# Patient Record
Sex: Male | Born: 1955 | Hispanic: Yes | Marital: Married
Health system: Southern US, Academic
[De-identification: ages and names within clinical notes are randomized; demographics above are authoritative.]

## PROBLEM LIST (undated history)

## (undated) ENCOUNTER — Telehealth

## (undated) ENCOUNTER — Ambulatory Visit: Payer: PRIVATE HEALTH INSURANCE

## (undated) ENCOUNTER — Ambulatory Visit

## (undated) ENCOUNTER — Ambulatory Visit
Payer: PRIVATE HEALTH INSURANCE | Attending: Student in an Organized Health Care Education/Training Program | Primary: Student in an Organized Health Care Education/Training Program

## (undated) ENCOUNTER — Encounter

## (undated) ENCOUNTER — Ambulatory Visit: Payer: MEDICAID

## (undated) ENCOUNTER — Other Ambulatory Visit

## (undated) ENCOUNTER — Encounter
Attending: Student in an Organized Health Care Education/Training Program | Primary: Student in an Organized Health Care Education/Training Program

## (undated) DIAGNOSIS — I10 Essential (primary) hypertension: Secondary | ICD-10-CM

## (undated) DIAGNOSIS — C801 Malignant (primary) neoplasm, unspecified: Secondary | ICD-10-CM

## (undated) DIAGNOSIS — E119 Type 2 diabetes mellitus without complications: Secondary | ICD-10-CM

## (undated) DIAGNOSIS — D689 Coagulation defect, unspecified: Secondary | ICD-10-CM

## (undated) DIAGNOSIS — I8290 Acute embolism and thrombosis of unspecified vein: Secondary | ICD-10-CM

## (undated) HISTORY — PX: APPENDECTOMY: SHX54

## (undated) HISTORY — DX: Malignant (primary) neoplasm, unspecified: C80.1

## (undated) HISTORY — DX: Coagulation defect, unspecified: D68.9

---

## 2013-12-23 ENCOUNTER — Emergency Department: Payer: Self-pay | Admitting: Emergency Medicine

## 2013-12-23 LAB — URINALYSIS, COMPLETE
BACTERIA: NONE SEEN
BILIRUBIN, UR: NEGATIVE
Glucose,UR: NEGATIVE mg/dL (ref 0–75)
KETONE: NEGATIVE
Leukocyte Esterase: NEGATIVE
Nitrite: NEGATIVE
PROTEIN: NEGATIVE
Ph: 5 (ref 4.5–8.0)
RBC,UR: 1 /HPF (ref 0–5)
Specific Gravity: 1.02 (ref 1.003–1.030)
Squamous Epithelial: <1
WBC UR: NONE SEEN /HPF (ref 0–5)

## 2013-12-23 LAB — COMPREHENSIVE METABOLIC PANEL
ANION GAP: 5 — AB (ref 7–16)
Albumin: 3.8 g/dL (ref 3.4–5.0)
Alkaline Phosphatase: 69 U/L
BUN: 9 mg/dL (ref 7–18)
Bilirubin,Total: 0.8 mg/dL (ref 0.2–1.0)
CALCIUM: 9.1 mg/dL (ref 8.5–10.1)
CHLORIDE: 108 mmol/L — AB (ref 98–107)
CO2: 28 mmol/L (ref 21–32)
CREATININE: 1.17 mg/dL (ref 0.60–1.30)
EGFR (African American): >60
EGFR (Non-African Amer.): >60
Glucose: 90 mg/dL (ref 65–99)
Osmolality: 279 (ref 275–301)
POTASSIUM: 4.9 mmol/L (ref 3.5–5.1)
SGOT(AST): 13 U/L — ABNORMAL LOW (ref 15–37)
SGPT (ALT): 20 U/L
SODIUM: 141 mmol/L (ref 136–145)
TOTAL PROTEIN: 7.2 g/dL (ref 6.4–8.2)

## 2013-12-23 LAB — CBC WITH DIFFERENTIAL/PLATELET
HCT: 46.5 % (ref 40.0–52.0)
HGB: 14.7 g/dL (ref 13.0–18.0)
Lymphocytes: 82 %
MCH: 29.8 pg (ref 26.0–34.0)
MCHC: 31.6 g/dL — AB (ref 32.0–36.0)
MCV: 94 fL (ref 80–100)
Monocytes: 1 %
Other Cells Blood: 2
PLATELETS: 132 10*3/uL — AB (ref 150–440)
RBC: 4.93 10*6/uL (ref 4.40–5.90)
RDW: 14.6 % — AB (ref 11.5–14.5)
SEGMENTED NEUTROPHILS: 6 %
Variant Lymphocyte - H1-Rlymph: 9 %
WBC: 63.7 10*3/uL — AB (ref 3.8–10.6)

## 2013-12-23 LAB — TROPONIN I

## 2013-12-23 LAB — LIPASE, BLOOD: Lipase: 147 U/L (ref 73–393)

## 2014-01-04 DIAGNOSIS — E119 Type 2 diabetes mellitus without complications: Secondary | ICD-10-CM | POA: Insufficient documentation

## 2014-01-08 ENCOUNTER — Ambulatory Visit: Payer: Self-pay | Admitting: Oncology

## 2014-01-08 LAB — CBC CANCER CENTER
BASOS ABS: 0.1 x10 3/mm (ref 0.0–0.1)
Basophil %: 0.2 %
EOS PCT: 0.6 %
Eosinophil #: 0.4 x10 3/mm (ref 0.0–0.7)
HCT: 45 % (ref 40.0–52.0)
HGB: 14.3 g/dL (ref 13.0–18.0)
LYMPHS PCT: 91 %
Lymphocyte #: 54.6 x10 3/mm — ABNORMAL HIGH (ref 1.0–3.6)
MCH: 29.7 pg (ref 26.0–34.0)
MCHC: 31.9 g/dL — ABNORMAL LOW (ref 32.0–36.0)
MCV: 93 fL (ref 80–100)
Monocyte #: 1 x10 3/mm (ref 0.2–1.0)
Monocyte %: 1.7 %
NEUTROS PCT: 6.5 %
Neutrophil #: 3.9 x10 3/mm (ref 1.4–6.5)
Platelet: 138 x10 3/mm — ABNORMAL LOW (ref 150–440)
RBC: 4.82 10*6/uL (ref 4.40–5.90)
RDW: 14.5 % (ref 11.5–14.5)
WBC: 60 x10 3/mm — AB (ref 3.8–10.6)

## 2014-01-11 ENCOUNTER — Ambulatory Visit: Payer: Self-pay | Admitting: Oncology

## 2014-02-11 ENCOUNTER — Ambulatory Visit: Payer: Self-pay | Admitting: Oncology

## 2014-04-09 ENCOUNTER — Ambulatory Visit: Payer: Self-pay | Admitting: Oncology

## 2014-09-27 ENCOUNTER — Ambulatory Visit: Payer: Self-pay | Admitting: Oncology

## 2014-10-30 ENCOUNTER — Inpatient Hospital Stay: Payer: Medicaid Other | Admitting: Oncology

## 2014-11-06 ENCOUNTER — Inpatient Hospital Stay: Payer: Medicaid Other | Attending: Oncology | Admitting: Oncology

## 2014-11-07 ENCOUNTER — Encounter: Payer: Self-pay | Admitting: Oncology

## 2014-11-07 NOTE — Progress Notes (Signed)
Patient did not show up for 11/06/14 appointment but showed up on 11/07/14.  When patient was informed by scheduling that he would need to reschedule appointment the patient refused then got up and walked out.

## 2014-11-15 ENCOUNTER — Encounter: Payer: Self-pay | Admitting: Oncology

## 2014-11-15 ENCOUNTER — Inpatient Hospital Stay: Payer: Medicaid Other | Attending: Oncology | Admitting: Oncology

## 2014-11-15 ENCOUNTER — Inpatient Hospital Stay: Payer: Medicaid Other

## 2014-11-15 VITALS — BP 126/76 | HR 60 | Temp 96.0°F | Resp 17 | Ht 75.0 in | Wt 224.1 lb

## 2014-11-15 DIAGNOSIS — M542 Cervicalgia: Secondary | ICD-10-CM | POA: Insufficient documentation

## 2014-11-15 DIAGNOSIS — D696 Thrombocytopenia, unspecified: Secondary | ICD-10-CM | POA: Diagnosis not present

## 2014-11-15 DIAGNOSIS — C9110 Chronic lymphocytic leukemia of B-cell type not having achieved remission: Secondary | ICD-10-CM | POA: Diagnosis not present

## 2014-11-15 LAB — CBC WITH DIFFERENTIAL/PLATELET
BASOS ABS: 0.4 10*3/uL — AB (ref 0–0.1)
Basophils Relative: 1 %
Eosinophils Absolute: 0.3 10*3/uL (ref 0–0.7)
HCT: 43.8 % (ref 40.0–52.0)
Hemoglobin: 14 g/dL (ref 13.0–18.0)
Lymphocytes Relative: 90 %
Lymphs Abs: 62.1 10*3/uL — ABNORMAL HIGH (ref 1.0–3.6)
MCH: 29.7 pg (ref 26.0–34.0)
MCHC: 32 g/dL (ref 32.0–36.0)
MCV: 93 fL (ref 80.0–100.0)
Monocytes Absolute: 0.9 10*3/uL (ref 0.2–1.0)
Monocytes Relative: 1 %
NEUTROS ABS: 4.4 10*3/uL (ref 1.4–6.5)
PLATELETS: 123 10*3/uL — AB (ref 150–440)
RBC: 4.72 MIL/uL (ref 4.40–5.90)
RDW: 14.6 % — ABNORMAL HIGH (ref 11.5–14.5)
WBC: 68.1 10*3/uL (ref 3.8–10.6)

## 2014-11-20 NOTE — Progress Notes (Signed)
[place]  [phone] [phone]  ID: Audry Riles. OB: [birth year]  MR#: [id number]  WGY#:[id number]  No care team member to display  CHIEF COMPLAINT: CLL, Rai stage 0  Chief Complaint  Patient presents with  . Follow-up    Discuss neck treatment     INTERVAL HISTORY: Patient  Silva Bandy last evaluated in clinic in September 2015. 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 He denies ever receiving treatment for his CLL. Currently, other than his neck pain he feels well. He has no neurologic complaints. He denies any fevers or night sweats. He has a good appetite and denies weight loss. He denies any chest pain or shortness of breath. He denies any nausea, vomiting, constipation, or diarrhea. He has no urinary complaints. Patient otherwise feels well and offers no further specific complaints.  REVIEW OF SYSTEMS:   Review of Systems  Constitutional: Negative for fever and weight loss.  Respiratory: Negative.   Cardiovascular: Negative.   Musculoskeletal: Positive for neck pain.  Neurological: Negative.     As per HPI. Otherwise, a complete review of systems is negatve.  PAST MEDICAL HISTORY: Past Medical History  Diagnosis Date  . Cancer     CLL monitored     PAST SURGICAL HISTORY: No past surgical history on file.  FAMILY HISTORY No family history on file.     ADVANCED DIRECTIVES:    HEALTH MAINTENANCE: History  Substance Use Topics  . Smoking status: Never Smoker   . Smokeless tobacco: Never Used  . Alcohol Use: No     Colonoscopy:  PAP:  Bone density:  Lipid panel:  Not on File  No current outpatient prescriptions on file.   No current facility-administered medications for this visit.    OBJECTIVE: Filed Vitals:   11/15/14 1105  BP: 126/76  Pulse: 60  Temp: 96 F (35.6 C)  Resp: 17     Body mass index is 28.01 kg/(m^2).    ECOG  FS:0 - Asymptomatic  General: Well-developed, well-nourished, no acute distress. Eyes: Pink conjunctiva, anicteric sclera. Lungs: Clear to auscultation bilaterally. Heart: Regular rate and rhythm. No rubs, murmurs, or gallops. Abdomen: Soft, nontender, nondistended. No organomegaly noted, normoactive bowel sounds. Musculoskeletal: No edema, cyanosis, or clubbing. Neuro: Alert, answering all questions appropriately. Cranial nerves grossly intact. Skin: No rashes or petechiae noted. Psych: Normal affect. Lymphatics: No cervical, calvicular, axillary or inguinal LAD.   LAB RESULTS:  Lab Results  Component Value Date   NA 141 12/23/2013   K 4.9 12/23/2013   CL 108* 12/23/2013   CO2 28 12/23/2013   GLUCOSE 90 12/23/2013   BUN 9 12/23/2013   CREATININE 1.17 12/23/2013   CALCIUM 9.1 12/23/2013   PROT 7.2 12/23/2013   ALBUMIN 3.8 12/23/2013   AST 13* 12/23/2013   ALT 20 12/23/2013   ALKPHOS 69 12/23/2013   BILITOT 0.8 12/23/2013   GFRNONAA >60 12/23/2013   GFRAA >60 12/23/2013    Lab Results  Component Value Date   WBC 68.1* 11/15/2014   NEUTROABS 4.4 11/15/2014   HGB 14.0 11/15/2014   HCT 43.8 11/15/2014   MCV 93.0 11/15/2014   PLT 123* 11/15/2014     STUDIES: No results found.  ASSESSMENT: CLL, Rai stage 0  PLAN:    1. CLL: Patient's white blood cell count is elevated at 68, which is essentially unchanged from September 2015. It is unclear what his baseline WBC count.  Patient's  peripheral blood flow cytometry confirmed the diagnosis.  Patient was noted to have a CD7 aberrant T cell antigen which occasionally can be considered poor prognosis. CT scan results from October 2015 revealed only mild lymphadenopathy. This only needs to be repeated if there is concern of progression of disease and treatment is necessary.  Patient does not require treatment at this time. He does not require bone marrow biopsy. Return to clinic in 6 months with repeat laboratory work and  further evaluation.   2. Thrombocytopenia: Mild. Likely secondary to CLL. Monitor.  Patient expressed understanding and was in agreement with this plan. He also understands that He can call clinic at any time with any questions, concerns, or complaints.     [name], MD   11/20/2014 2:35 PM

## 2014-11-28 ENCOUNTER — Encounter: Payer: Self-pay | Admitting: Emergency Medicine

## 2014-11-28 ENCOUNTER — Emergency Department
Admission: EM | Admit: 2014-11-28 | Discharge: 2014-11-28 | Disposition: A | Discharge status: Home / Self Care | Payer: Medicaid Other | Attending: Student | Admitting: Student

## 2014-11-28 DIAGNOSIS — M5416 Radiculopathy, lumbar region: Secondary | ICD-10-CM | POA: Insufficient documentation

## 2014-11-28 DIAGNOSIS — M5410 Radiculopathy, site unspecified: Secondary | ICD-10-CM

## 2014-11-28 DIAGNOSIS — M545 Low back pain: Secondary | ICD-10-CM | POA: Diagnosis present

## 2014-11-28 MED ORDER — DEXAMETHASONE SODIUM PHOSPHATE 10 MG/ML IJ SOLN
10.0000 mg | Freq: Once | INTRAMUSCULAR | Status: AC
  Administered 2014-11-28: 10 mg via INTRAMUSCULAR
  Filled 2014-11-28: qty 1

## 2014-11-28 MED ORDER — TRAMADOL HCL 50 MG PO TABS: 50.0000 mg | ORAL_TABLET | Freq: Four times a day (QID) | ORAL | Status: DC | PRN

## 2014-11-28 MED ORDER — METHYLPREDNISOLONE 4 MG PO TBPK: ORAL_TABLET | ORAL | Status: DC

## 2014-11-28 NOTE — ED Notes (Signed)
Pt to ed with c./o lower back pain and right leg pain, pt states increased pain with ambulation, denies injury.

## 2014-11-28 NOTE — ED Notes (Signed)
Pt to ed with c/o lower back pain and right leg pain,  Denies recent injury, reports hx of chronic back pain. Ambulates with pain.

## 2014-11-28 NOTE — ED Provider Notes (Signed)
[facility] Emergency Department Provider Note  ____________________________________________  Time seen: Approximately 9:25 AM  I have reviewed the triage vital signs and the nursing notes.   HISTORY  Chief Complaint No chief complaint on file.    HPI Anthony Hart. is a 59 y.o. male patient complain of 6 months of radicular low back pain to the lower extremities. Patient denies any bladder or bowel dysfunction. Patient has been followed by his PCP who is referring him to a specialist. Patient said he is scheduled to see his PCP tomorrow but does not think for his discomfort at this time. Patient is rating his pain as a 7/10. Describes as sharp in the back and tingling into the lower extremities. Currently is taking muscle relaxants for his complaint.   Past Medical History  Diagnosis Date  . Cancer     CLL monitored     There are no active problems to display for this patient.   No past surgical history on file.  Current Outpatient Rx  Name  Route  Sig  Dispense  Refill  . methylPREDNISolone (MEDROL DOSEPAK) 4 MG TBPK tablet      Take Tapered dose as directed   21 tablet   0   . traMADol (ULTRAM) 50 MG tablet   Oral   Take 1 tablet (50 mg total) by mouth every 6 (six) hours as needed for moderate pain.   12 tablet   0     Allergies Review of patient's allergies indicates not on file.  No family history on file.  Social History Social History  Substance Use Topics  . Smoking status: Never Smoker   . Smokeless tobacco: Never Used  . Alcohol Use: No    Review of Systems Constitutional: No fever/chills Eyes: No visual changes. ENT: No sore throat. Cardiovascular: Denies chest pain. Respiratory: Denies shortness of breath. Gastrointestinal: No abdominal pain.  No nausea, no vomiting.  No diarrhea.  No constipation. Genitourinary: Negative for dysuria. Musculoskeletal: Positive for back pain. Skin: Negative for  rash. Neurological: Negative for headaches, focal weakness or numbness. 10-point ROS otherwise negative.  ____________________________________________   PHYSICAL EXAM:  VITAL SIGNS: ED Triage Vitals  Enc Vitals Group     BP --      Pulse --      Resp --      Temp --      Temp src --      SpO2 --      Weight --      Height --      Head Cir --      Peak Flow --      Pain Score --      Pain Loc --      Pain Edu? --      Excl. in [place]? --     Constitutional: Alert and oriented. Well appearing and in no acute distress. Eyes: Conjunctivae are normal. PERRL. EOMI. Head: Atraumatic. Nose: No congestion/rhinnorhea. Mouth/Throat: Mucous membranes are moist.  Oropharynx non-erythematous. Neck: No stridor. No cervical spine tenderness to palpation. Hematological/Lymphatic/Immunilogical: No cervical lymphadenopathy. Cardiovascular: Normal rate, regular rhythm. Grossly normal heart sounds.  Good peripheral circulation. Respiratory: Normal respiratory effort.  No retractions. Lungs CTAB. Gastrointestinal: Soft and nontender. No distention. No abdominal bruits. No CVA tenderness. Musculoskeletal: No spinal deformity. Decreased range of motion with extension. No lower extremity tenderness nor edema.  No joint effusions. Patient has a positive trait leg test. Neurologic:  Normal speech and language. No  gross focal neurologic deficits are appreciated. No gait instability. Skin:  Skin is warm, dry and intact. No rash noted. Psychiatric: Mood and affect are normal. Speech and behavior are normal.  ____________________________________________   LABS (all labs ordered are listed, but only abnormal results are displayed)  Labs Reviewed - No data to display ____________________________________________  EKG   ____________________________________________  RADIOLOGY   ____________________________________________   PROCEDURES  Procedure(s) performed: None  Critical Care performed:  No  ____________________________________________   INITIAL IMPRESSION / ASSESSMENT AND PLAN / ED COURSE  Pertinent labs & imaging results that were available during my care of the patient were reviewed by me and considered in my medical decision making (see chart for details).  Radicular low back pain to the bilateral extremities. Patient started on prednisone and tramadol. Positive follow-up with scheduled appointment. Return by ER if condition worsens. ____________________________________________   FINAL CLINICAL IMPRESSION(S) / ED DIAGNOSES  Final diagnoses:  Radicular low back pain      Sable Feil, PA-C 11/28/14 7616  Joanne Gavel, MD 11/28/14 [phone]

## 2015-01-01 ENCOUNTER — Other Ambulatory Visit: Payer: Self-pay | Admitting: Gastroenterology

## 2015-01-01 DIAGNOSIS — R1013 Epigastric pain: Principal | ICD-10-CM

## 2015-01-01 DIAGNOSIS — R1032 Left lower quadrant pain: Secondary | ICD-10-CM

## 2015-01-01 DIAGNOSIS — R1031 Right lower quadrant pain: Secondary | ICD-10-CM

## 2015-01-01 DIAGNOSIS — G8929 Other chronic pain: Secondary | ICD-10-CM

## 2015-01-07 ENCOUNTER — Ambulatory Visit
Admission: RE | Admit: 2015-01-07 | Discharge: 2015-01-07 | Disposition: A | Discharge status: Home / Self Care | Payer: Medicaid Other | Source: Ambulatory Visit | Attending: Gastroenterology | Admitting: Gastroenterology

## 2015-01-07 DIAGNOSIS — R1032 Left lower quadrant pain: Secondary | ICD-10-CM

## 2015-01-07 DIAGNOSIS — G8929 Other chronic pain: Secondary | ICD-10-CM | POA: Diagnosis present

## 2015-01-07 DIAGNOSIS — I700 Atherosclerosis of aorta: Secondary | ICD-10-CM | POA: Diagnosis not present

## 2015-01-07 DIAGNOSIS — R911 Solitary pulmonary nodule: Secondary | ICD-10-CM | POA: Diagnosis not present

## 2015-01-07 DIAGNOSIS — R1031 Right lower quadrant pain: Secondary | ICD-10-CM

## 2015-01-07 DIAGNOSIS — R161 Splenomegaly, not elsewhere classified: Secondary | ICD-10-CM | POA: Diagnosis not present

## 2015-01-07 DIAGNOSIS — R1013 Epigastric pain: Secondary | ICD-10-CM

## 2015-01-07 LAB — POCT I-STAT CREATININE: CREATININE: 1.1 mg/dL (ref 0.61–1.24)

## 2015-01-07 MED ORDER — IOHEXOL 300 MG/ML  SOLN
100.0000 mL | Freq: Once | INTRAMUSCULAR | Status: AC | PRN
  Administered 2015-01-07: 100 mL via INTRAVENOUS

## 2015-02-01 ENCOUNTER — Other Ambulatory Visit: Payer: Self-pay | Admitting: Student

## 2015-02-01 DIAGNOSIS — G8929 Other chronic pain: Secondary | ICD-10-CM

## 2015-02-01 DIAGNOSIS — M5441 Lumbago with sciatica, right side: Principal | ICD-10-CM

## 2015-02-01 DIAGNOSIS — M5442 Lumbago with sciatica, left side: Principal | ICD-10-CM

## 2015-02-04 ENCOUNTER — Ambulatory Visit: Payer: Medicaid Other | Admitting: Anesthesiology

## 2015-02-04 ENCOUNTER — Encounter
Admission: RE | Disposition: A | Discharge status: Home / Self Care | Payer: Self-pay | Source: Ambulatory Visit | Attending: Gastroenterology

## 2015-02-04 ENCOUNTER — Ambulatory Visit
Admission: RE | Admit: 2015-02-04 | Discharge: 2015-02-04 | Disposition: A | Discharge status: Home / Self Care | Payer: Medicaid Other | Source: Ambulatory Visit | Attending: Gastroenterology | Admitting: Gastroenterology

## 2015-02-04 ENCOUNTER — Encounter: Payer: Self-pay | Admitting: Anesthesiology

## 2015-02-04 DIAGNOSIS — Z79899 Other long term (current) drug therapy: Secondary | ICD-10-CM | POA: Insufficient documentation

## 2015-02-04 DIAGNOSIS — G8929 Other chronic pain: Secondary | ICD-10-CM | POA: Diagnosis not present

## 2015-02-04 DIAGNOSIS — M549 Dorsalgia, unspecified: Secondary | ICD-10-CM | POA: Insufficient documentation

## 2015-02-04 DIAGNOSIS — K529 Noninfective gastroenteritis and colitis, unspecified: Secondary | ICD-10-CM | POA: Insufficient documentation

## 2015-02-04 DIAGNOSIS — R1084 Generalized abdominal pain: Secondary | ICD-10-CM | POA: Diagnosis present

## 2015-02-04 DIAGNOSIS — Z856 Personal history of leukemia: Secondary | ICD-10-CM | POA: Insufficient documentation

## 2015-02-04 DIAGNOSIS — K2950 Unspecified chronic gastritis without bleeding: Secondary | ICD-10-CM | POA: Diagnosis not present

## 2015-02-04 DIAGNOSIS — A048 Other specified bacterial intestinal infections: Secondary | ICD-10-CM | POA: Diagnosis not present

## 2015-02-04 DIAGNOSIS — D125 Benign neoplasm of sigmoid colon: Secondary | ICD-10-CM | POA: Diagnosis not present

## 2015-02-04 HISTORY — PX: COLONOSCOPY WITH PROPOFOL: SHX5780

## 2015-02-04 HISTORY — PX: ESOPHAGOGASTRODUODENOSCOPY (EGD) WITH PROPOFOL: SHX5813

## 2015-02-04 SURGERY — ESOPHAGOGASTRODUODENOSCOPY (EGD) WITH PROPOFOL
Anesthesia: General

## 2015-02-04 MED ORDER — SODIUM CHLORIDE 0.9 % IV SOLN
INTRAVENOUS | Status: DC
  Administered 2015-02-04: 12:00:00 via INTRAVENOUS

## 2015-02-04 MED ORDER — MIDAZOLAM HCL 2 MG/2ML IJ SOLN
INTRAMUSCULAR | Status: DC | PRN
  Administered 2015-02-04: 1 mg via INTRAVENOUS

## 2015-02-04 MED ORDER — FENTANYL CITRATE (PF) 100 MCG/2ML IJ SOLN
INTRAMUSCULAR | Status: DC | PRN
  Administered 2015-02-04: 50 ug via INTRAVENOUS

## 2015-02-04 MED ORDER — PROPOFOL 500 MG/50ML IV EMUL
INTRAVENOUS | Status: DC | PRN
Start: 2015-02-04 — End: 2015-02-04
  Administered 2015-02-04: 100 ug/kg/min via INTRAVENOUS

## 2015-02-04 MED ORDER — SODIUM CHLORIDE 0.9 % IV SOLN
INTRAVENOUS | Status: DC
  Administered 2015-02-04: 11:00:00 via INTRAVENOUS

## 2015-02-04 NOTE — Transfer of Care (Signed)
Immediate Anesthesia Transfer of Care Note  Patient: Anthony Hart.  Procedure(s) Performed: Procedure(s): ESOPHAGOGASTRODUODENOSCOPY (EGD) WITH PROPOFOL (N/A) COLONOSCOPY WITH PROPOFOL  Patient Location: PACU and Endoscopy Unit  Anesthesia Type:General  Level of Consciousness: sedated and patient cooperative  Airway & Oxygen Therapy: Patient Spontanous Breathing and Patient connected to face mask oxygen  Post-op Assessment: Report given to RN and Post -op Vital signs reviewed and stable  Post vital signs: Reviewed and stable  Last Vitals:  Filed Vitals:   02/04/15 1222  BP: 108/69  Pulse: 71  Temp: 36.5 C  Resp: 22    Complications: No apparent anesthesia complications

## 2015-02-04 NOTE — Op Note (Signed)
[facility] Gastroenterology Patient Name: Anthony Hart Procedure Date: 02/04/2015 11:45 AM MRN: [id number] Account #: [id number] Date of Birth: 1955/06/29 Admit Type: Outpatient Age: 59 Room: [facility] ENDO ROOM 4 Gender: Male Note Status: Finalized Procedure:         Upper GI endoscopy Indications:       Generalized abdominal pain Providers:         Lupita Dawn. Candace Cruise, MD Referring MD:      Ardyth Man. Bobette Mo (Referring MD) Medicines:         Monitored Anesthesia Care Complications:     No immediate complications. Procedure:         Pre-Anesthesia Assessment:                    - Prior to the procedure, a History and Physical was                     performed, and patient medications, allergies and                     sensitivities were reviewed. The patient's tolerance of                     previous anesthesia was reviewed.                    - The risks and benefits of the procedure and the sedation                     options and risks were discussed with the patient. All                     questions were answered and informed consent was obtained.                    - After reviewing the risks and benefits, the patient was                     deemed in satisfactory condition to undergo the procedure.                    After obtaining informed consent, the endoscope was passed                     under direct vision. Throughout the procedure, the                     patient's blood pressure, pulse, and oxygen saturations                     were monitored continuously. The Olympus GIF-160 endoscope                     (S#. [id number]) was introduced through the mouth, and                     advanced to the second part of duodenum. The upper GI                     endoscopy was accomplished without difficulty. The patient                     tolerated the procedure well. Findings:      The examined esophagus was normal.      Diffuse  mildly erythematous mucosa was found  in the gastric body.       Biopsies were taken with a cold forceps for histology.      The examined duodenum was normal.      The exam of the stomach was otherwise normal. Impression:        - Normal esophagus.                    - Erythematous mucosa in the gastric body. Biopsied.                    - Normal examined duodenum. Recommendation:    - Discharge patient to home.                    - Observe patient's clinical course.                    - Await pathology results.                    - The findings and recommendations were discussed with the                     patient. Procedure Code(s): --- Professional ---                    [phone], Esophagogastroduodenoscopy, flexible, transoral;                     with biopsy, single or multiple Diagnosis Code(s): --- Professional ---                    K31.9, Disease of stomach and duodenum, unspecified                    R10.84, Generalized abdominal pain CPT copyright 2014 American Medical Association. All rights reserved. The codes documented in this report are preliminary and upon coder review may  be revised to meet current compliance requirements. [name], MD 02/04/2015 11:54:41 AM This report has been signed electronically. Number of Addenda: 0 Note Initiated On: 02/04/2015 11:45 AM      [facility]

## 2015-02-04 NOTE — Op Note (Signed)
[facility] Gastroenterology Patient Name: Anthony Hart Procedure Date: 02/04/2015 11:56 AM MRN: [id number] Account #: [id number] Date of Birth: 1955/06/15 Admit Type: Outpatient Age: 59 Room: [facility] ENDO ROOM 4 Gender: Male Note Status: Finalized Procedure:         Colonoscopy Indications:       Generalized abdominal pain, Chronic diarrhea Providers:         Lupita Dawn. Candace Cruise, MD Referring MD:      Ardyth Man. Bobette Mo (Referring MD) Medicines:         Monitored Anesthesia Care Complications:     No immediate complications. Procedure:         Pre-Anesthesia Assessment:                    - Prior to the procedure, a History and Physical was                     performed, and patient medications, allergies and                     sensitivities were reviewed. The patient's tolerance of                     previous anesthesia was reviewed.                    - The risks and benefits of the procedure and the sedation                     options and risks were discussed with the patient. All                     questions were answered and informed consent was obtained.                    - After reviewing the risks and benefits, the patient was                     deemed in satisfactory condition to undergo the procedure.                    After obtaining informed consent, the colonoscope was                     passed under direct vision. Throughout the procedure, the                     patient's blood pressure, pulse, and oxygen saturations                     were monitored continuously. The Colonoscope was                     introduced through the anus and advanced to the the cecum,                     identified by appendiceal orifice and ileocecal valve. The                     colonoscopy was performed without difficulty. The patient                     tolerated the procedure well. The quality of the bowel  preparation was poor. Findings:      A  diminutive polyp was found in the distal sigmoid colon. The polyp was       sessile. The polyp was removed with a jumbo cold forceps. Resection and       retrieval were complete. Biopsies for histology were taken with a cold       forceps from the entire colon for evaluation of microscopic colitis.      The exam was otherwise without abnormality. Impression:        - Preparation of the colon was poor.                    - One diminutive polyp in the distal sigmoid colon.                     Resected and retrieved. Biopsied.                    - The examination was otherwise normal. Recommendation:    - Discharge patient to home.                    - Await pathology results.                    - Repeat colonoscopy in 5-10 years for surveillance based                     on pathology results.                    - The findings and recommendations were discussed with the                     patient. Procedure Code(s): --- Professional ---                    [phone], Colonoscopy, flexible; with biopsy, single or                     multiple Diagnosis Code(s): --- Professional ---                    D12.5, Benign neoplasm of sigmoid colon                    R10.84, Generalized abdominal pain                    K52.9, Noninfective gastroenteritis and colitis,                     unspecified CPT copyright 2014 American Medical Association. All rights reserved. The codes documented in this report are preliminary and upon coder review may  be revised to meet current compliance requirements. [name], MD 02/04/2015 12:19:14 PM This report has been signed electronically. Number of Addenda: 0 Note Initiated On: 02/04/2015 11:56 AM Scope Withdrawal Time: 0 hours 6 minutes 2 seconds  Total Procedure Duration: 0 hours 16 minutes 35 seconds       [facility]

## 2015-02-04 NOTE — H&P (Signed)
    Primary Care Physician:  PROVIDER NOT IN SYSTEM Primary Gastroenterologist:  Dr. Candace Cruise  Pre-Procedure History & Physical: HPI:  Anthony Hart. is a 59 y.o. male is here for an EGD/colonoscopy.   Past Medical History  Diagnosis Date  . Cancer     CLL monitored     Past Surgical History  Procedure Laterality Date  . Appendectomy      Prior to Admission medications   Medication Sig Start Date End Date Taking? Authorizing Provider  methylPREDNISolone (MEDROL DOSEPAK) 4 MG TBPK tablet Take Tapered dose as directed 11/28/14   Sable Feil, PA-C  traMADol (ULTRAM) 50 MG tablet Take 1 tablet (50 mg total) by mouth every 6 (six) hours as needed for moderate pain. 11/28/14   Sable Feil, PA-C    Allergies as of 01/09/2015  . (No Known Allergies)    No family history on file.  Social History   Social History  . Marital Status: Single    Spouse Name: N/A  . Number of Children: N/A  . Years of Education: N/A   Occupational History  . Not on file.   Social History Main Topics  . Smoking status: Never Smoker   . Smokeless tobacco: Never Used  . Alcohol Use: No  . Drug Use: No  . Sexual Activity: Not on file   Other Topics Concern  . Not on file   Social History Narrative    Review of Systems: See HPI, otherwise negative ROS  Physical Exam: There were no vitals taken for this visit. General:   Alert,  pleasant and cooperative in NAD Head:  Normocephalic and atraumatic. Neck:  Supple; no masses or thyromegaly. Lungs:  Clear throughout to auscultation.    Heart:  Regular rate and rhythm. Abdomen:  Soft, nontender and nondistended. Normal bowel sounds, without guarding, and without rebound.   Neurologic:  Alert and  oriented x4;  grossly normal neurologically.  Impression/Plan: Romualdo Bolk. is here for an EGD/colonoscopy to be performed for abdominal pain, diarrhea  Risks, benefits, limitations, and alternatives regarding EGD/colonoscopy have been  reviewed with the patient.  Questions have been answered.  All parties agreeable.   Loui Massenburg, Lupita Dawn, MD  02/04/2015, 9:37 AM

## 2015-02-04 NOTE — Anesthesia Preprocedure Evaluation (Signed)
Anesthesia Evaluation  Patient identified by MRN, date of birth, ID band Patient awake    Reviewed: Allergy & Precautions, H&P , NPO status , Patient's Chart, lab work & pertinent test results, reviewed documented beta blocker date and time   History of Anesthesia Complications Negative for: history of anesthetic complications  Airway Mallampati: II  TM Distance: >3 FB Neck ROM: full    Dental no notable dental hx. (+) Teeth Intact   Pulmonary neg pulmonary ROS,    Pulmonary exam normal breath sounds clear to auscultation       Cardiovascular Exercise Tolerance: Good negative cardio ROS Normal cardiovascular exam Rhythm:regular Rate:Normal     Neuro/Psych negative neurological ROS  negative psych ROS   GI/Hepatic negative GI ROS, Neg liver ROS,   Endo/Other  diabetes (100 lb weight loss and none since)  Renal/GU negative Renal ROS  negative genitourinary   Musculoskeletal   Abdominal   Peds  Hematology  (+) Blood dyscrasia (CLL), ,   Anesthesia Other Findings Past Medical History:   Cancer ([place])                                                   Comment:CLL monitored    Reproductive/Obstetrics negative OB ROS                             Anesthesia Physical Anesthesia Plan  ASA: II  Anesthesia Plan: General   Post-op Pain Management:    Induction:   Airway Management Planned:   Additional Equipment:   Intra-op Plan:   Post-operative Plan:   Informed Consent: I have reviewed the patients History and Physical, chart, labs and discussed the procedure including the risks, benefits and alternatives for the proposed anesthesia with the patient or authorized representative who has indicated his/her understanding and acceptance.   Dental Advisory Given  Plan Discussed with: Anesthesiologist, CRNA and Surgeon  Anesthesia Plan Comments:         Anesthesia Quick  Evaluation

## 2015-02-05 NOTE — Anesthesia Postprocedure Evaluation (Signed)
  Anesthesia Post-op Note  Patient: Anthony Hart.  Procedure(s) Performed: Procedure(s): ESOPHAGOGASTRODUODENOSCOPY (EGD) WITH PROPOFOL (N/A) COLONOSCOPY WITH PROPOFOL  Anesthesia type:General  Patient location: PACU  Post pain: Pain level controlled  Post assessment: Post-op Vital signs reviewed, Patient's Cardiovascular Status Stable, Respiratory Function Stable, Patent Airway and No signs of Nausea or vomiting  Post vital signs: Reviewed and stable  Last Vitals:  Filed Vitals:   02/04/15 1250  BP: 131/86  Pulse: 63  Temp:   Resp: 12    Level of consciousness: awake, alert  and patient cooperative  Complications: No apparent anesthesia complications

## 2015-02-07 ENCOUNTER — Encounter: Payer: Self-pay | Admitting: Gastroenterology

## 2015-02-07 LAB — SURGICAL PATHOLOGY

## 2015-02-13 ENCOUNTER — Ambulatory Visit
Admission: RE | Admit: 2015-02-13 | Discharge: 2015-02-13 | Disposition: A | Discharge status: Home / Self Care | Payer: Medicaid Other | Source: Ambulatory Visit | Attending: Student | Admitting: Student

## 2015-02-13 DIAGNOSIS — M5146 Schmorl's nodes, lumbar region: Secondary | ICD-10-CM | POA: Insufficient documentation

## 2015-02-13 DIAGNOSIS — M4807 Spinal stenosis, lumbosacral region: Secondary | ICD-10-CM | POA: Insufficient documentation

## 2015-02-13 DIAGNOSIS — G8929 Other chronic pain: Secondary | ICD-10-CM | POA: Diagnosis present

## 2015-02-13 DIAGNOSIS — M5136 Other intervertebral disc degeneration, lumbar region: Secondary | ICD-10-CM | POA: Diagnosis not present

## 2015-02-13 DIAGNOSIS — M2578 Osteophyte, vertebrae: Secondary | ICD-10-CM | POA: Diagnosis not present

## 2015-02-13 DIAGNOSIS — M5441 Lumbago with sciatica, right side: Secondary | ICD-10-CM | POA: Diagnosis present

## 2015-02-13 DIAGNOSIS — M5137 Other intervertebral disc degeneration, lumbosacral region: Secondary | ICD-10-CM | POA: Diagnosis not present

## 2015-02-13 DIAGNOSIS — M5442 Lumbago with sciatica, left side: Secondary | ICD-10-CM

## 2015-03-01 ENCOUNTER — Other Ambulatory Visit: Payer: Self-pay | Admitting: Orthopedic Surgery

## 2015-03-01 DIAGNOSIS — M1711 Unilateral primary osteoarthritis, right knee: Secondary | ICD-10-CM

## 2015-03-01 DIAGNOSIS — M1712 Unilateral primary osteoarthritis, left knee: Secondary | ICD-10-CM

## 2015-03-22 ENCOUNTER — Ambulatory Visit: Admission: RE | Admit: 2015-03-22 | Payer: Medicaid Other | Source: Ambulatory Visit

## 2015-03-28 ENCOUNTER — Emergency Department: Payer: Medicaid Other

## 2015-03-28 ENCOUNTER — Emergency Department
Admission: EM | Admit: 2015-03-28 | Discharge: 2015-03-29 | Disposition: A | Discharge status: Home / Self Care | Payer: Medicaid Other | Attending: Emergency Medicine | Admitting: Emergency Medicine

## 2015-03-28 DIAGNOSIS — R1013 Epigastric pain: Secondary | ICD-10-CM | POA: Diagnosis not present

## 2015-03-28 DIAGNOSIS — Z856 Personal history of leukemia: Secondary | ICD-10-CM | POA: Diagnosis not present

## 2015-03-28 DIAGNOSIS — J209 Acute bronchitis, unspecified: Secondary | ICD-10-CM | POA: Diagnosis not present

## 2015-03-28 DIAGNOSIS — R05 Cough: Secondary | ICD-10-CM | POA: Diagnosis present

## 2015-03-28 DIAGNOSIS — Z791 Long term (current) use of non-steroidal anti-inflammatories (NSAID): Secondary | ICD-10-CM | POA: Diagnosis not present

## 2015-03-28 DIAGNOSIS — R040 Epistaxis: Secondary | ICD-10-CM | POA: Diagnosis not present

## 2015-03-28 DIAGNOSIS — Z79899 Other long term (current) drug therapy: Secondary | ICD-10-CM | POA: Insufficient documentation

## 2015-03-28 DIAGNOSIS — Z87898 Personal history of other specified conditions: Secondary | ICD-10-CM

## 2015-03-28 LAB — COMPREHENSIVE METABOLIC PANEL
ALK PHOS: 60 U/L (ref 38–126)
ALT: 28 U/L (ref 17–63)
AST: 28 U/L (ref 15–41)
Albumin: 4 g/dL (ref 3.5–5.0)
Anion gap: 4 — ABNORMAL LOW (ref 5–15)
BUN: 12 mg/dL (ref 6–20)
CHLORIDE: 106 mmol/L (ref 101–111)
CO2: 32 mmol/L (ref 22–32)
CREATININE: 0.87 mg/dL (ref 0.61–1.24)
Calcium: 9 mg/dL (ref 8.9–10.3)
GFR calc Af Amer: >60 mL/min (ref >60)
Glucose, Bld: 104 mg/dL — ABNORMAL HIGH (ref 65–99)
Potassium: 4.5 mmol/L (ref 3.5–5.1)
Sodium: 142 mmol/L (ref 135–145)
Total Bilirubin: 0.5 mg/dL (ref 0.3–1.2)
Total Protein: 6.6 g/dL (ref 6.5–8.1)

## 2015-03-28 LAB — CBC
HCT: 41.5 % (ref 40.0–52.0)
Hemoglobin: 13.3 g/dL (ref 13.0–18.0)
MCH: 30.2 pg (ref 26.0–34.0)
MCHC: 32 g/dL (ref 32.0–36.0)
MCV: 94.4 fL (ref 80.0–100.0)
PLATELETS: 147 10*3/uL — AB (ref 150–440)
RBC: 4.4 MIL/uL (ref 4.40–5.90)
RDW: 15.2 % — AB (ref 11.5–14.5)
WBC: 18.1 10*3/uL — ABNORMAL HIGH (ref 3.8–10.6)

## 2015-03-28 NOTE — ED Notes (Signed)
Pt in with co runny nose and congestion for 1 week, has seen blood in nose and in sputum.  Pt is in remission for CLL, denies fever.

## 2015-03-29 MED ORDER — AZITHROMYCIN 250 MG PO TABS: ORAL_TABLET | ORAL | Status: DC

## 2015-03-29 NOTE — ED Provider Notes (Signed)
[facility] Emergency Department Provider Note  ____________________________________________  Time seen: Approximately 12:16 AM  I have reviewed the triage vital signs and the nursing notes.   HISTORY  Chief Complaint Cough    HPI Anthony Hart. is a 59 y.o. male whose past medical history includes CLL diagnosed approximately 12 years ago and who has not been on any chemotherapy for years and is followed by Dr. Regis Bill and.  He presents today with 1-2 weeks (he thinks closer to 2 weeks) of upper respiratory symptoms including runny nose, congestion, postnasal drip, and frequent cough that is productive of sputum.  Over the last couple of days he has had intermittent nosebleeds including earlier today and thinks that he may have coughed up some blood as well although it may have just come from his nose.  He denies chest pain, shortness of breath, wheezing, nausea/vomiting.  He has had some epigastric burning pain but he is also being treated for H. Pylori by Dr. Candace Cruise and this is not unusual for him to have similar discomfort.  He came in because of his symptoms being moderate, the bleeding from his nose, and is concerned given his CLL.  He states that he thinks he has bronchitis because of the persistent cough.  He also denies fever and chills as well as general malaise and myalgias.   Past Medical History  Diagnosis Date  . Cancer ([place])     CLL monitored     There are no active problems to display for this patient.   Past Surgical History  Procedure Laterality Date  . Appendectomy    . Esophagogastroduodenoscopy (egd) with propofol N/A 02/04/2015    Procedure: ESOPHAGOGASTRODUODENOSCOPY (EGD) WITH PROPOFOL;  Surgeon: [name], MD;  Location: [facility] ENDOSCOPY;  Service: Gastroenterology;  Laterality: N/A;  . Colonoscopy with propofol  02/04/2015    Procedure: COLONOSCOPY WITH PROPOFOL;  Surgeon: [name], MD;  Location: [facility] ENDOSCOPY;  Service:  Gastroenterology;;    Current Outpatient Rx  Name  Route  Sig  Dispense  Refill  . azithromycin (ZITHROMAX) 250 MG tablet      Take 2 tablets PO on day 1, then take 1 tablet PO daily for 4 more days   6 each   0   . cyclobenzaprine (FLEXERIL) 10 MG tablet   Oral   Take 10 mg by mouth 3 (three) times daily as needed for muscle spasms.         . meloxicam (MOBIC) 15 MG tablet   Oral   Take 15 mg by mouth daily.         . methylPREDNISolone (MEDROL DOSEPAK) 4 MG TBPK tablet      Take Tapered dose as directed   21 tablet   0   . traMADol (ULTRAM) 50 MG tablet   Oral   Take 1 tablet (50 mg total) by mouth every 6 (six) hours as needed for moderate pain.   12 tablet   0     Allergies Review of patient's allergies indicates no known allergies.  No family history on file.  Social History Social History  Substance Use Topics  . Smoking status: Never Smoker   . Smokeless tobacco: Never Used  . Alcohol Use: No    Review of Systems Constitutional: No fever/chills Eyes: No visual changes. ENT: No sore throat.  intermittent nosebleeds for 2 days.nasal congestion and "dripping". Cardiovascular: Denies chest pain. Respiratory: Denies shortness of breath.frequent productive cough for greater than 1  week Gastrointestinal: intermittent epigastric pain possibly with some mild hemoptysis.  No nausea, no vomiting.  No diarrhea.  No constipation. Genitourinary: Negative for dysuria. Musculoskeletal: Negative for back pain. Skin: Negative for rash. Neurological: Negative for headaches, focal weakness or numbness.  10-point ROS otherwise negative.  ____________________________________________   PHYSICAL EXAM:  VITAL SIGNS: ED Triage Vitals  Enc Vitals Group     BP 03/28/15 2225 142/77 mmHg     Pulse Rate 03/28/15 2225 64     Resp 03/28/15 2225 18     Temp 03/28/15 2225 97.8 F (36.6 C)     Temp Source 03/28/15 2225 Oral     SpO2 03/28/15 2225 98 %     Weight  03/28/15 2225 226 lb (102.513 kg)     Height 03/28/15 2225 6\' 3"  (1.905 m)     Head Cir --      Peak Flow --      Pain Score 03/28/15 2225 0     Pain Loc --      Pain Edu? --      Excl. in [place]? --     Constitutional: Alert and oriented. Well appearing and in no acute distress. Eyes: Conjunctivae are normal. PERRL. EOMI. Head: Atraumatic. Nose: No congestion/rhinnorhea at this time, but both nares have evidence of dried blood from his prior epistaxis.  No tenderness to palpation of the patient's sinuses. Mouth/Throat: Mucous membranes are moist.  Oropharynx non-erythematous. Neck: No stridor.   Cardiovascular: Normal rate, regular rhythm. Grossly normal heart sounds.  Good peripheral circulation. Respiratory: Normal respiratory effort.  No retractions. Lungs CTAB.no wheezing Rales or rhonchi Gastrointestinal: Soft and nontender. No distention. No abdominal bruits. No CVA tenderness. Musculoskeletal: No lower extremity tenderness nor edema.  No joint effusions. Neurologic:  Normal speech and language. No gross focal neurologic deficits are appreciated.  Skin:  Skin is warm, dry and intact. No rash noted. Psychiatric: Mood and affect are normal. Speech and behavior are normal.  ____________________________________________   LABS (all labs ordered are listed, but only abnormal results are displayed)  Labs Reviewed  COMPREHENSIVE METABOLIC PANEL - Abnormal; Notable for the following:    Glucose, Bld 104 (*)    Anion gap 4 (*)    All other components within normal limits  CBC - Abnormal; Notable for the following:    WBC 18.1 (*)    RDW 15.2 (*)    Platelets 147 (*)    All other components within normal limits   ____________________________________________  EKG  Not indicated ____________________________________________  RADIOLOGY   Dg Chest 2 View  03/28/2015  CLINICAL DATA:  Pt in with co runny nose and congestion for 1 week, has seen blood in nose and in sputum. Pt is  in remission for CLL, denies fever. no hx of heart or lung issues. EXAM: CHEST  2 VIEW COMPARISON:  None. FINDINGS: Midline trachea. Normal heart size and mediastinal contours. No pleural effusion or pneumothorax. Clear lungs. IMPRESSION: No acute cardiopulmonary disease. Electronically Signed   By: Abigail Miyamoto M.D.   On: 03/28/2015 22:48    ____________________________________________   PROCEDURES  Procedure(s) performed: None  Critical Care performed: No ____________________________________________   INITIAL IMPRESSION / ASSESSMENT AND PLAN / ED COURSE  Pertinent labs & imaging results that were available during my care of the patient were reviewed by me and considered in my medical decision making (see chart for details).  The patient is well-appearing and in no acute distress with normal lung sounds and normal  work of breathing.  Given the evidence of recent epistaxis I believe that the "hemoptysis" he was seeing is more likely blood that was from his nose.  He is having no active bleeding at this time.  I will add oncoagulation studies but except for a leukocytosis the patient's CBC is unremarkable.  I will treat him empirically with azithromycin given the duration of his bronchitis-like symptoms and I advised close outpatient follow-up with his primary care doctor.  I gave my usual and customary return precautions.   He is comfortable with the plan at this time.  ____________________________________________  FINAL CLINICAL IMPRESSION(S) / ED DIAGNOSES  Final diagnoses:  Acute bronchitis, unspecified organism  H/O epistaxis  Personal history of CLL (chronic lymphocytic leukemia)      NEW MEDICATIONS STARTED DURING THIS VISIT:  New Prescriptions   AZITHROMYCIN (ZITHROMAX) 250 MG TABLET    Take 2 tablets PO on day 1, then take 1 tablet PO daily for 4 more days     Hinda Kehr, MD 03/29/15 [phone]

## 2015-03-29 NOTE — Discharge Instructions (Signed)
We believe that your symptoms are caused today by bronchitis.  Please take the prescribed medications and any medications that you have at home.  Follow up with your doctor as recommended.  If you develop any new or worsening symptoms, including but not limited to fever, persistent vomiting, worsening shortness of breath, or other symptoms that concern you, please return to the Emergency Department immediately.  If you have a recurrence of your nosebleed, please try the following instructions: As we discussed, there are several techniques you can use to prevent or stop nosebleeds in the future.  Keep your nose moist either with saline spray several times a day or by applying a thin layer of Neosporin, bacitracin, or other antibiotic ointment to the inside of your nose once or twice a day.  If the bleeding starts up again, gently blow your nose into a tissue to clear the blood and clots, then apply 1-2 sprays to each affected nostril of over-the-counter Afrin nasal spray (oxymetazoline).   Then squeeze your nose shut tightly and DO NOT PEEK for at least 15 minutes.  This will resolve most nosebleeds.  If you continue to have trouble after trying these techniques, or anything seems out of the ordinary or concerns you, please return tot he Emergency Department.     Acute Bronchitis Bronchitis is inflammation of the airways that extend from the windpipe into the lungs (bronchi). The inflammation often causes mucus to develop. This leads to a cough, which is the most common symptom of bronchitis.  In acute bronchitis, the condition usually develops suddenly and goes away over time, usually in a couple weeks. Smoking, allergies, and asthma can make bronchitis worse. Repeated episodes of bronchitis may cause further lung problems.  CAUSES Acute bronchitis is most often caused by the same virus that causes a cold. The virus can spread from person to person (contagious) through coughing, sneezing, and  touching contaminated objects. SIGNS AND SYMPTOMS   Cough.   Fever.   Coughing up mucus.   Body aches.   Chest congestion.   Chills.   Shortness of breath.   Sore throat.  DIAGNOSIS  Acute bronchitis is usually diagnosed through a physical exam. Your health care provider will also ask you questions about your medical history. Tests, such as chest X-rays, are sometimes done to rule out other conditions.  TREATMENT  Acute bronchitis usually goes away in a couple weeks. Oftentimes, no medical treatment is necessary. Medicines are sometimes given for relief of fever or cough. Antibiotic medicines are usually not needed but may be prescribed in certain situations. In some cases, an inhaler may be recommended to help reduce shortness of breath and control the cough. A cool mist vaporizer may also be used to help thin bronchial secretions and make it easier to clear the chest.  HOME CARE INSTRUCTIONS  Get plenty of rest.   Drink enough fluids to keep your urine clear or pale yellow (unless you have a medical condition that requires fluid restriction). Increasing fluids may help thin your respiratory secretions (sputum) and reduce chest congestion, and it will prevent dehydration.   Take medicines only as directed by your health care provider.  If you were prescribed an antibiotic medicine, finish it all even if you start to feel better.  Avoid smoking and secondhand smoke. Exposure to cigarette smoke or irritating chemicals will make bronchitis worse. If you are a smoker, consider using nicotine gum or skin patches to help control withdrawal symptoms. Quitting smoking will help your  lungs heal faster.   Reduce the chances of another bout of acute bronchitis by washing your hands frequently, avoiding people with cold symptoms, and trying not to touch your hands to your mouth, nose, or eyes.   Keep all follow-up visits as directed by your health care provider.  SEEK MEDICAL  CARE IF: Your symptoms do not improve after 1 week of treatment.  SEEK IMMEDIATE MEDICAL CARE IF:  You develop an increased fever or chills.   You have chest pain.   You have severe shortness of breath.  You have bloody sputum.   You develop dehydration.  You faint or repeatedly feel like you are going to pass out.  You develop repeated vomiting.  You develop a severe headache. MAKE SURE YOU:   Understand these instructions.  Will watch your condition.  Will get help right away if you are not doing well or get worse.   This information is not intended to replace advice given to you by your health care provider. Make sure you discuss any questions you have with your health care provider.   Document Released: 05/07/2004 Document Revised: 04/20/2014 Document Reviewed: 09/20/2012 Elsevier Interactive Patient Education 2016 [place] are common. They are due to a crack in the inside lining of your nose (mucous membrane) or from a small blood vessel that starts to bleed. Nosebleeds can be caused by many conditions, such as injury, infections, dry mucous membranes or dry climate, medicines, nose picking, and home heating and cooling systems. Most nosebleeds come from blood vessels in the front of your nose.  HOME CARE INSTRUCTIONS  Try controlling your nosebleed by pinching your nostrils gently and continuously for at least 10 minutes.  Avoid blowing or sniffing your nose for a number of hours after having a nosebleed.  Do not put gauze inside your nose yourself. If your nose was packed by your health care provider, try to maintain the pack inside of your nose until your health care provider removes it.  If a gauze pack was used and it starts to fall out, gently replace it or cut off the end of it.  If a balloon catheter was used to pack your nose, do not cut or remove it unless your health care provider has instructed you to do that. Avoid lying down  while you are having a nosebleed. Sit up and lean forward.  Use a nasal spray decongestant to help with a nosebleed as directed by your health care provider.  Do not use petroleum jelly or mineral oil in your nose. These can drip into your lungs.  Maintain humidity in your home by using less air conditioning or by using a humidifier.  Aspirin and blood thinners make bleeding more likely. If you are prescribed these medicines and you suffer from nosebleeds, ask your health care provider if you should stop taking the medicines or adjust the dose. Do not stop medicines unless directed by your health care provider  Resume your normal activities as you are able, but avoid straining, lifting, or bending at the waist for several days.  If your nosebleed was caused by dry mucous membranes, use over-the-counter saline nasal spray or gel. This will keep the mucous membranes moist and allow them to heal. If you must use a lubricant, choose the water-soluble variety. Use it only sparingly, and do not use it within several hours of lying down.  Keep all follow-up visits as directed by your health care provider. This is  important. SEEK MEDICAL CARE IF:  You have a fever.  You get frequent nosebleeds.  You are getting nosebleeds more often. SEEK IMMEDIATE MEDICAL CARE IF:  Your nosebleed lasts longer than 20 minutes.  Your nosebleed occurs after an injury to your face, and your nose looks crooked or broken.  You have unusual bleeding from other parts of your body.  You have unusual bruising on other parts of your body.  You feel light-headed or you faint.  You become sweaty.  You vomit blood.  Your nosebleed occurs after a head injury. This information is not intended to replace advice given to you by your health care provider. Make sure you discuss any questions you have with your health care provider.  Document Released: 01/07/2005 Document Revised: 04/20/2014 Document Reviewed: 11/13/2013  Elsevier  Interactive Patient Education [employer].

## 2015-05-23 ENCOUNTER — Inpatient Hospital Stay: Payer: Medicaid Other | Admitting: Oncology

## 2015-05-23 ENCOUNTER — Inpatient Hospital Stay: Payer: Medicaid Other

## 2015-05-28 DIAGNOSIS — M5136 Other intervertebral disc degeneration, lumbar region: Secondary | ICD-10-CM | POA: Insufficient documentation

## 2015-05-30 ENCOUNTER — Inpatient Hospital Stay: Payer: Medicaid Other

## 2015-05-30 ENCOUNTER — Inpatient Hospital Stay: Payer: Medicaid Other | Admitting: Oncology

## 2015-06-17 ENCOUNTER — Inpatient Hospital Stay: Payer: Medicaid Other | Admitting: Oncology

## 2015-06-17 ENCOUNTER — Inpatient Hospital Stay: Payer: Medicaid Other

## 2016-01-16 ENCOUNTER — Telehealth: Payer: Self-pay | Admitting: Orthopedic Surgery

## 2016-01-17 ENCOUNTER — Ambulatory Visit: Payer: Medicaid Other

## 2016-01-22 IMAGING — MR MR LUMBAR SPINE W/O CM
5 series · 34 of 48 positions shown · non-contrast
Comparison: Lumbar MRI 12/23/2013.  Abdominal pelvic CT 01/07/2015.

CLINICAL DATA: Chronic low back pain with bilateral leg pain,
numbness and tingling worsening over the last 3 months. Remote
lifting injury. No acute injury or prior relevant surgery.

EXAM:
MRI LUMBAR SPINE WITHOUT CONTRAST
TECHNIQUE: Multiplanar, multisequence MR imaging of the lumbar spine was
performed. No intravenous contrast was administered.

[Series 2: T2 · sagittal · 4.0mm · 0.81mm/px · 6 of 17 slices shown (1 of 2)]
[im 1/17]
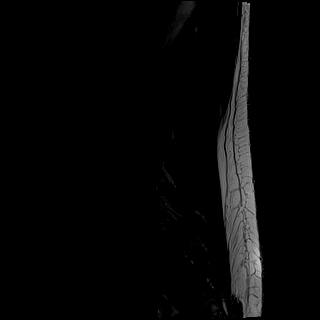
[im 4/17]
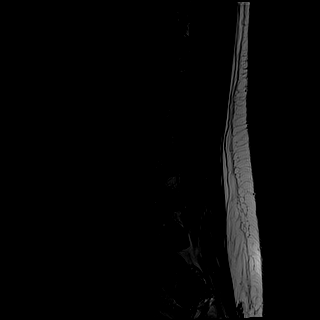
[im 7/17]
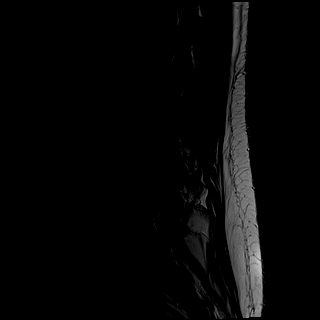
[im 10/17]
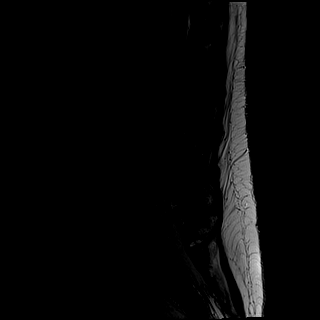
[im 13/17]
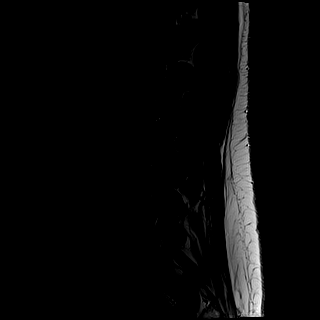
[im 17/17]
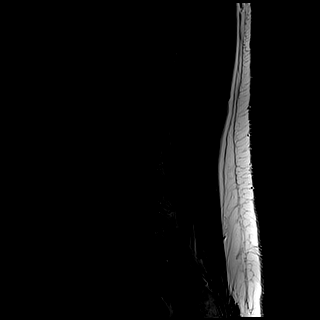

[Series 3: T1 · sagittal · 4.0mm · 0.81mm/px · 6 of 17 slices shown (1 of 2)]
[im 1/17]
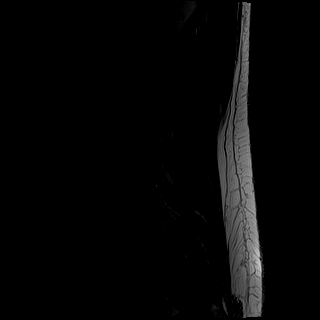
[im 4/17]
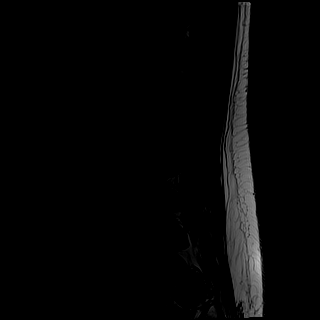
[im 7/17]
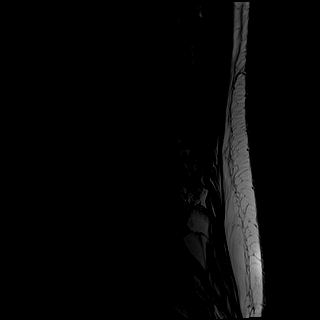
[im 10/17]
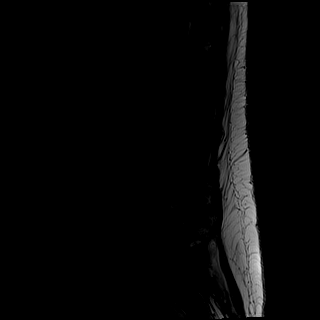
[im 13/17]
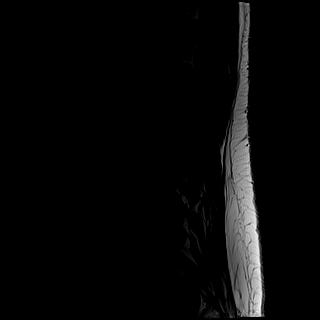
[im 17/17]
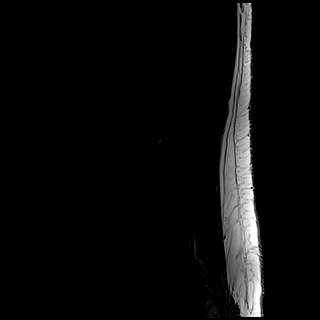

[Series 4: STIR · sagittal · 4.0mm · 1.02mm/px · 4 of 17 slices shown]
[im 1/17]
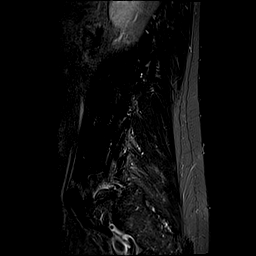
[im 4/17]
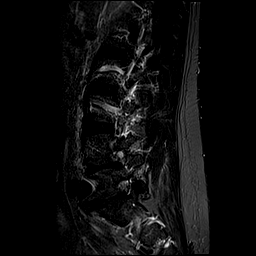
[im 7/17]
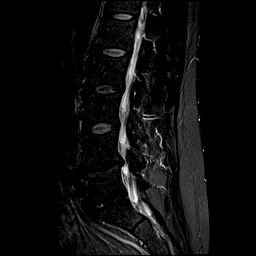
[im 10/17]
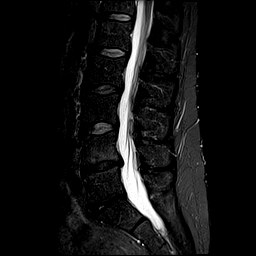

[Series 5: T2 · axial · 4.0mm · 0.78mm/px · z∈[-123,+80]mm · 9 of 40 slices shown (2 of 2)]
[im 1/40]
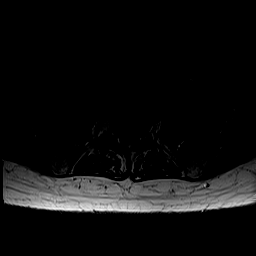
[im 6/40]
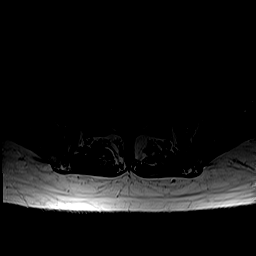
[im 12/40]
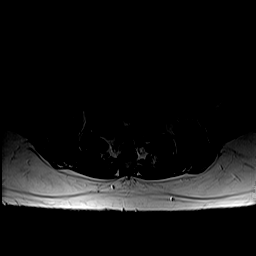
[im 17/40]
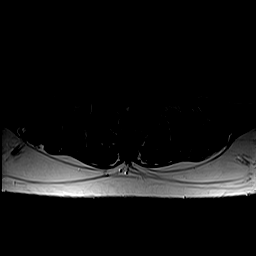
[im 20/40]
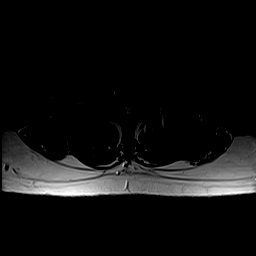
[im 23/40]
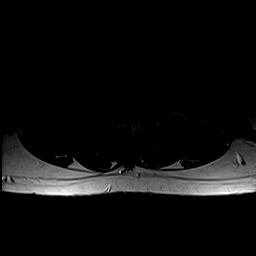
[im 28/40]
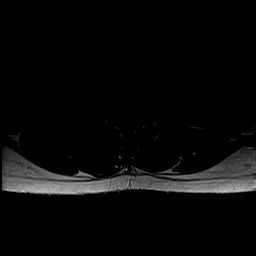
[im 34/40]
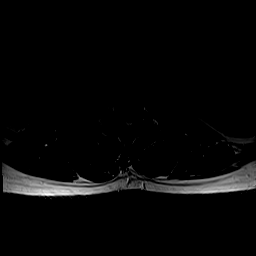
[im 40/40]
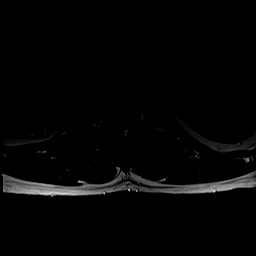

[Series 6: T1 · axial · 4.0mm · 0.39mm/px · z∈[-123,+80]mm · 9 of 40 slices shown (2 of 2)]
[im 1/40]
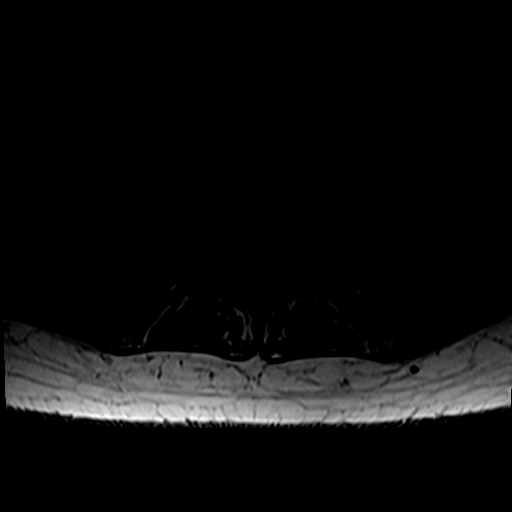
[im 6/40]
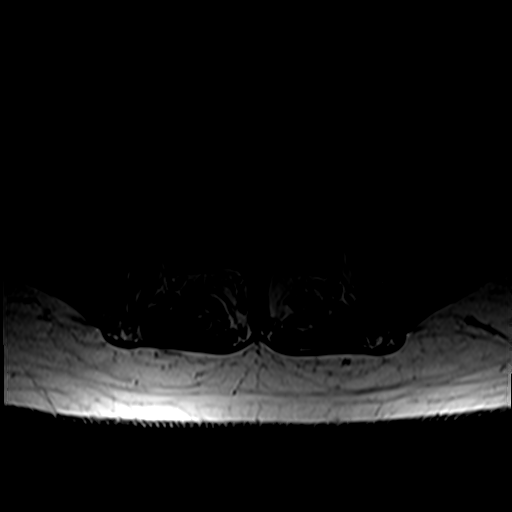
[im 12/40]
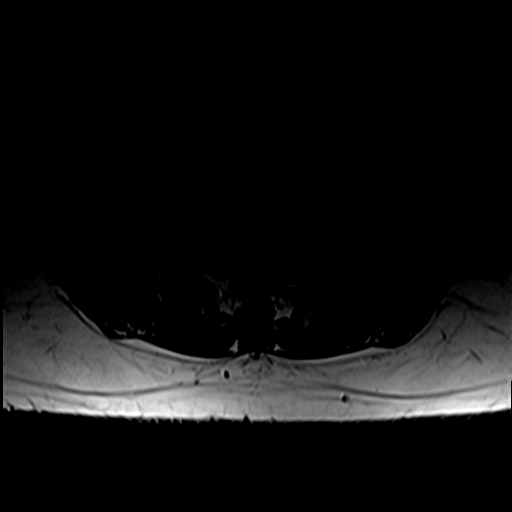
[im 17/40]
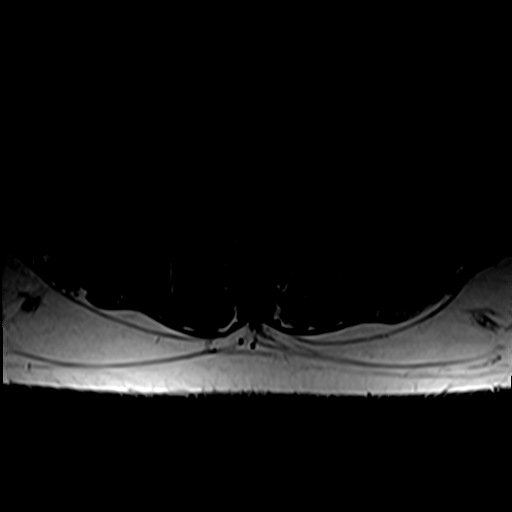
[im 20/40]
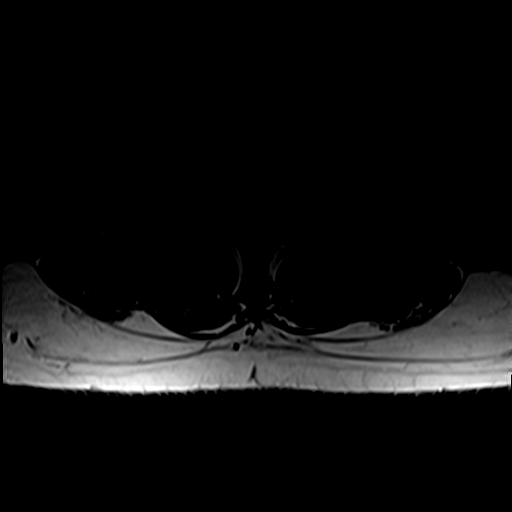
[im 23/40]
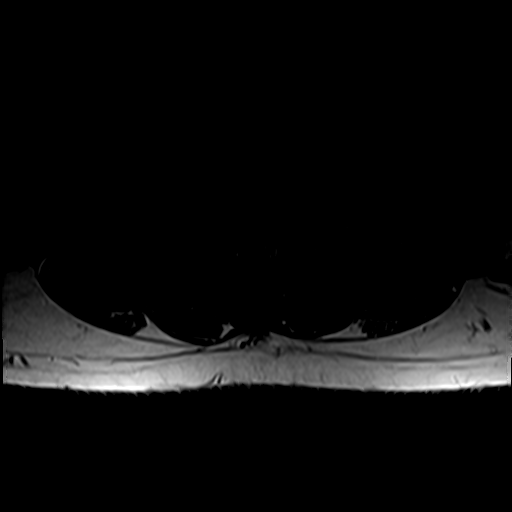
[im 28/40]
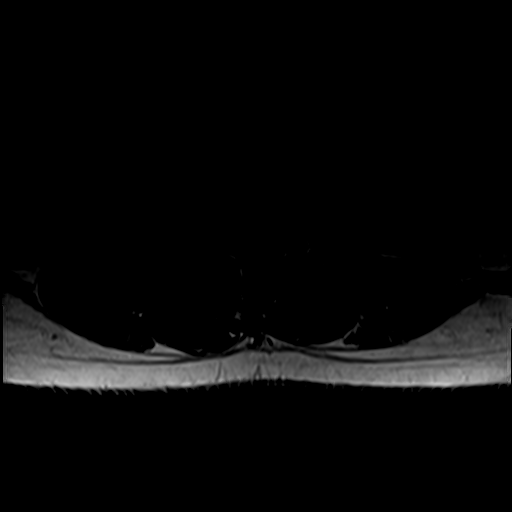
[im 34/40]
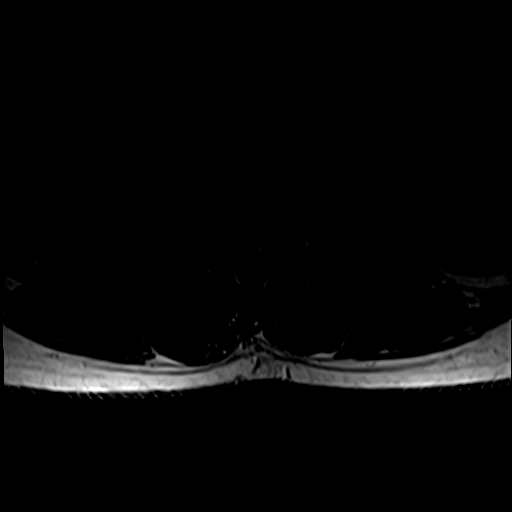
[im 40/40]
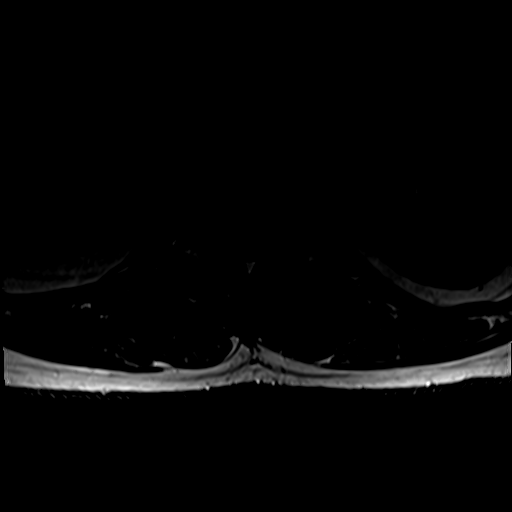

[34 of 48 positions shown; findings below may reference images not displayed]

FINDINGS: Segmentation: Conventional anatomy assumed, with the last open disc
space designated L5-S1.

Alignment:  Stable minimal convex left scoliosis.

Bones: There is an enlarging Schmorl's node in the inferior right
endplate of L4, measuring up to 1.4 cm in diameter. This has
enlarged from the previous MRI, although does not appear
significantly changed from the more recent CT. This has sclerotic
margins with surrounding fatty changes. Endplate degenerative
changes at L4-5 and L5-S1 are otherwise unchanged.

Conus medullaris: Extends to the L1 level and appears normal.

Paraspinal and other soft tissues: No significant paraspinal
findings.

Disc levels:

There are no significant disc space findings at T12-L1 or L1-2.

L2-3: Stable mild disc desiccation and bulging. No significant
spinal stenosis or nerve root encroachment.

L3-4: Disc height and hydration are maintained. Stable mild
bilateral facet hypertrophy. No spinal stenosis or nerve root
encroachment.

L4-5: Progressive loss of disc height with annular disc bulging
eccentric to the left. As above, enlarging Schmorl's node in the
inferior endplate of L4 on the right. The central disc protrusion
has involuted. There is stable mild facet and ligamentous
hypertrophy. There is mildly progressive narrowing of the left
lateral recess with possible extraforaminal left L4 nerve root
encroachment.

L5-S1: Stable chronic degenerative disc disease with loss of disc
height and endplate osteophytes. Mild facet degenerative changes are
present bile there is mild-to-moderate foraminal narrowing
bilaterally which appears unchanged. The lateral recesses and
central canal are patent.
IMPRESSION: 1. Progressive disc and endplate degeneration at L4-5 with an
enlarging Schmorl's node involving the right inferior L4 endplate.
Asymmetric disc bulging on the left contributes to mildly
progressive left lateral recess narrowing. In addition, there is
potential extraforaminal left L4 nerve root encroachment by disc
material and osteophytes.
2. Stable chronic degenerative disc disease at L5-S1 with paraspinal
osteophytes contributing to chronic mild-to-moderate foraminal
narrowing bilaterally.
3. Stable mild disc bulging at L2-3.

## 2016-06-30 ENCOUNTER — Emergency Department: Payer: Medicaid Other

## 2016-06-30 ENCOUNTER — Encounter: Payer: Self-pay | Admitting: Emergency Medicine

## 2016-06-30 ENCOUNTER — Inpatient Hospital Stay
Admission: EM | Admit: 2016-06-30 | Discharge: 2016-07-03 | DRG: 270 | Disposition: A | Discharge status: Home / Self Care | Payer: Medicaid Other | Attending: Internal Medicine | Admitting: Internal Medicine

## 2016-06-30 DIAGNOSIS — I82401 Acute embolism and thrombosis of unspecified deep veins of right lower extremity: Secondary | ICD-10-CM | POA: Diagnosis not present

## 2016-06-30 DIAGNOSIS — I82431 Acute embolism and thrombosis of right popliteal vein: Secondary | ICD-10-CM | POA: Diagnosis present

## 2016-06-30 DIAGNOSIS — Z7984 Long term (current) use of oral hypoglycemic drugs: Secondary | ICD-10-CM | POA: Diagnosis not present

## 2016-06-30 DIAGNOSIS — I82421 Acute embolism and thrombosis of right iliac vein: Secondary | ICD-10-CM | POA: Diagnosis present

## 2016-06-30 DIAGNOSIS — Z79899 Other long term (current) drug therapy: Secondary | ICD-10-CM | POA: Diagnosis not present

## 2016-06-30 DIAGNOSIS — I2699 Other pulmonary embolism without acute cor pulmonale: Secondary | ICD-10-CM | POA: Diagnosis present

## 2016-06-30 DIAGNOSIS — M7989 Other specified soft tissue disorders: Secondary | ICD-10-CM | POA: Diagnosis present

## 2016-06-30 DIAGNOSIS — I82409 Acute embolism and thrombosis of unspecified deep veins of unspecified lower extremity: Secondary | ICD-10-CM | POA: Diagnosis present

## 2016-06-30 DIAGNOSIS — Z9049 Acquired absence of other specified parts of digestive tract: Secondary | ICD-10-CM | POA: Diagnosis not present

## 2016-06-30 DIAGNOSIS — E119 Type 2 diabetes mellitus without complications: Secondary | ICD-10-CM | POA: Diagnosis present

## 2016-06-30 DIAGNOSIS — R2241 Localized swelling, mass and lump, right lower limb: Secondary | ICD-10-CM | POA: Diagnosis not present

## 2016-06-30 DIAGNOSIS — I824Y1 Acute embolism and thrombosis of unspecified deep veins of right proximal lower extremity: Secondary | ICD-10-CM

## 2016-06-30 DIAGNOSIS — I82411 Acute embolism and thrombosis of right femoral vein: Secondary | ICD-10-CM | POA: Diagnosis present

## 2016-06-30 DIAGNOSIS — C9110 Chronic lymphocytic leukemia of B-cell type not having achieved remission: Secondary | ICD-10-CM | POA: Diagnosis not present

## 2016-06-30 DIAGNOSIS — R079 Chest pain, unspecified: Secondary | ICD-10-CM

## 2016-06-30 HISTORY — DX: Type 2 diabetes mellitus without complications: E11.9

## 2016-06-30 LAB — CBC
HEMATOCRIT: 41.8 % (ref 40.0–52.0)
Hemoglobin: 13.8 g/dL (ref 13.0–18.0)
MCH: 29.5 pg (ref 26.0–34.0)
MCHC: 33 g/dL (ref 32.0–36.0)
MCV: 89.3 fL (ref 80.0–100.0)
PLATELETS: 212 10*3/uL (ref 150–440)
RBC: 4.68 MIL/uL (ref 4.40–5.90)
RDW: 15.1 % — ABNORMAL HIGH (ref 11.5–14.5)
WBC: 78.3 10*3/uL — AB (ref 3.8–10.6)

## 2016-06-30 LAB — BASIC METABOLIC PANEL
Anion gap: 6 (ref 5–15)
BUN: 14 mg/dL (ref 6–20)
CO2: 27 mmol/L (ref 22–32)
CREATININE: 1 mg/dL (ref 0.61–1.24)
Calcium: 9.3 mg/dL (ref 8.9–10.3)
Chloride: 104 mmol/L (ref 101–111)
GFR calc non Af Amer: >60 mL/min (ref >60)
Glucose, Bld: 141 mg/dL — ABNORMAL HIGH (ref 65–99)
Potassium: 5.1 mmol/L (ref 3.5–5.1)
Sodium: 137 mmol/L (ref 135–145)

## 2016-06-30 LAB — GLUCOSE, CAPILLARY: GLUCOSE-CAPILLARY: 168 mg/dL — AB (ref 65–99)

## 2016-06-30 LAB — PROTIME-INR
INR: 1.08
Prothrombin Time: 14 seconds (ref 11.4–15.2)

## 2016-06-30 LAB — APTT: aPTT: 79 seconds — ABNORMAL HIGH (ref 24–36)

## 2016-06-30 LAB — TROPONIN I: Troponin I: <0.03 ng/mL (ref <0.03)

## 2016-06-30 MED ORDER — ONDANSETRON HCL 4 MG/2ML IJ SOLN: 4.0000 mg | Freq: Four times a day (QID) | INTRAMUSCULAR | Status: DC | PRN

## 2016-06-30 MED ORDER — SENNOSIDES-DOCUSATE SODIUM 8.6-50 MG PO TABS: 1.0000 | ORAL_TABLET | Freq: Every evening | ORAL | Status: DC | PRN

## 2016-06-30 MED ORDER — CYCLOBENZAPRINE HCL 10 MG PO TABS: 10.0000 mg | ORAL_TABLET | Freq: Three times a day (TID) | ORAL | Status: DC | PRN

## 2016-06-30 MED ORDER — ONDANSETRON HCL 4 MG PO TABS: 4.0000 mg | ORAL_TABLET | Freq: Four times a day (QID) | ORAL | Status: DC | PRN

## 2016-06-30 MED ORDER — HEPARIN (PORCINE) IN NACL 100-0.45 UNIT/ML-% IJ SOLN
2200.0000 [IU]/h | INTRAMUSCULAR | Status: DC
  Administered 2016-07-01: 2200 [IU]/h via INTRAVENOUS
  Administered 2016-07-01: 1800 [IU]/h via INTRAVENOUS
  Administered 2016-07-02 – 2016-07-03 (×3): 2200 [IU]/h via INTRAVENOUS
  Filled 2016-06-30 (×7): qty 250

## 2016-06-30 MED ORDER — SODIUM CHLORIDE 0.9 % IV SOLN
INTRAVENOUS | Status: DC
  Administered 2016-07-01 – 2016-07-03 (×5): via INTRAVENOUS

## 2016-06-30 MED ORDER — BISACODYL 5 MG PO TBEC: 5.0000 mg | DELAYED_RELEASE_TABLET | Freq: Every day | ORAL | Status: DC | PRN

## 2016-06-30 MED ORDER — INSULIN ASPART 100 UNIT/ML ~~LOC~~ SOLN
0.0000 [IU] | Freq: Three times a day (TID) | SUBCUTANEOUS | Status: DC
  Administered 2016-07-01: 3 [IU] via SUBCUTANEOUS
  Administered 2016-07-01 (×2): 1 [IU] via SUBCUTANEOUS
  Administered 2016-07-02: 2 [IU] via SUBCUTANEOUS
  Administered 2016-07-02: 1 [IU] via SUBCUTANEOUS
  Administered 2016-07-03: 2 [IU] via SUBCUTANEOUS
  Administered 2016-07-03: 1 [IU] via SUBCUTANEOUS
  Filled 2016-06-30: qty 3
  Filled 2016-06-30: qty 1
  Filled 2016-06-30: qty 2
  Filled 2016-06-30 (×3): qty 1
  Filled 2016-06-30: qty 2

## 2016-06-30 MED ORDER — KETOROLAC TROMETHAMINE 15 MG/ML IJ SOLN: 15.0000 mg | Freq: Four times a day (QID) | INTRAMUSCULAR | Status: DC | PRN

## 2016-06-30 MED ORDER — HYDROCODONE-ACETAMINOPHEN 5-325 MG PO TABS: 1.0000 | ORAL_TABLET | ORAL | Status: DC | PRN

## 2016-06-30 MED ORDER — SODIUM CHLORIDE 0.9% FLUSH
3.0000 mL | Freq: Two times a day (BID) | INTRAVENOUS | Status: DC
  Administered 2016-07-01 – 2016-07-03 (×3): 3 mL via INTRAVENOUS

## 2016-06-30 MED ORDER — HEPARIN BOLUS VIA INFUSION
5500.0000 [IU] | Freq: Once | INTRAVENOUS | Status: AC
  Administered 2016-06-30: 5500 [IU] via INTRAVENOUS
  Filled 2016-06-30: qty 5500

## 2016-06-30 NOTE — Consult Note (Signed)
ANTICOAGULATION CONSULT NOTE - Initial Consult  Pharmacy Consult for heparin drip Indication: DVT  No Known Allergies  Patient Measurements: Height: 6\' 3"  (190.5 cm) Weight: 264 lb (119.7 kg) IBW/kg (Calculated) : 84.5 Heparin Dosing Weight: 109.9 kg  Vital Signs: Temp: 98.1 F (36.7 C) (03/20 1526) Temp Source: Oral (03/20 1526) BP: 119/80 (03/20 1526) Pulse Rate: 76 (03/20 1526)  Labs:  Recent Labs  06/30/16 1530  HGB 13.8  HCT 41.8  PLT 212  CREATININE 1.00  TROPONINI <0.03    Estimated Creatinine Clearance: 112.3 mL/min (by C-G formula based on SCr of 1 mg/dL).   Medical History: Past Medical History:  Diagnosis Date  . Cancer ([place])    CLL monitored   . 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 heparin  5,500 Units Intravenous Once    Assessment: Pt is a 61 year old male who presents with lower extremity swelling and SOB w/ exertion. 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 Pharmacy consulted to dose heparin drip. Pt is not on anticoagulation at home. Baseline labs ordered.  Goal of Therapy:  Heparin level 0.3-0.7 units/ml Monitor platelets by anticoagulation protocol: Yes   Plan:  Give 5500 units bolus x 1 Start heparin infusion at 1800 units/hr Check anti-Xa level in 6 hours and daily while on heparin Continue to monitor H&H and platelets  Melissa D Maccia, Pharm.D, BCPS Clinical Pharmacist  06/30/2016,6:35 PM

## 2016-06-30 NOTE — H&P (Signed)
[name] NAME: Anthony Hart    MR#:  [id number]  DATE OF BIRTH:  [birth year]  DATE OF ADMISSION:  06/30/2016  PRIMARY CARE PHYSICIAN: [name], MD   REQUESTING/REFERRING PHYSICIAN: dr Dineen Kid  CHIEF COMPLAINT:   Right lower extremity swelling HISTORY OF PRESENT ILLNESS:  Anthony Hart  is a 61 y.o. male with a known history of Leukemia and diabetes who presents with above complaint. Patient reports over the past week he has had increasing right lower extremity swelling as well as some shortness of breath. 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 He is not currently treated for leukemia. He is followed at the cancer center.  PAST MEDICAL HISTORY:   Past Medical History:  Diagnosis Date  . Cancer ([place])    CLL monitored   . Diabetes mellitus without complication ([place])    Type II    PAST SURGICAL HISTORY:   Past Surgical History:  Procedure Laterality Date  . APPENDECTOMY    . COLONOSCOPY WITH PROPOFOL  02/04/2015   Procedure: COLONOSCOPY WITH PROPOFOL;  Surgeon: [name], MD;  Location: [facility] ENDOSCOPY;  Service: Gastroenterology;;  . ESOPHAGOGASTRODUODENOSCOPY (EGD) WITH PROPOFOL N/A 02/04/2015   Procedure: ESOPHAGOGASTRODUODENOSCOPY (EGD) WITH PROPOFOL;  Surgeon: [name], MD;  Location: [facility] ENDOSCOPY;  Service: Gastroenterology;  Laterality: N/A;    SOCIAL HISTORY:   Social History  Substance Use Topics  . Smoking status: Never Smoker  . Smokeless tobacco: Never Used  . Alcohol use No    FAMILY HISTORY:  none  DRUG ALLERGIES:  No Known Allergies  REVIEW OF SYSTEMS:   ROS  MEDICATIONS AT HOME:   Prior to Admission medications   Medication Sig Start Date End Date Taking? Authorizing Provider  metFORMIN (GLUCOPHAGE-XR) 750 MG 24 hr tablet Take 750 mg by mouth daily. 03/09/16 03/09/17 Yes Historical Provider, MD  naproxen sodium (ANAPROX) 220 MG tablet Take 220 mg by mouth 2 (two)  times daily with a meal.   Yes Historical Provider, MD  azithromycin (ZITHROMAX) 250 MG tablet Take 2 tablets PO on day 1, then take 1 tablet PO daily for 4 more days Patient not taking: Reported on 06/30/2016 03/29/15   Hinda Kehr, MD  cyclobenzaprine (FLEXERIL) 10 MG tablet Take 10 mg by mouth 3 (three) times daily as needed for muscle spasms.    Historical Provider, MD  meloxicam (MOBIC) 15 MG tablet Take 15 mg by mouth daily.    Historical Provider, MD  methylPREDNISolone (MEDROL DOSEPAK) 4 MG TBPK tablet Take Tapered dose as directed Patient not taking: Reported on 06/30/2016 11/28/14   Sable Feil, PA-C  traMADol (ULTRAM) 50 MG tablet Take 1 tablet (50 mg total) by mouth every 6 (six) hours as needed for moderate pain. Patient not taking: Reported on 06/30/2016 11/28/14   Sable Feil, PA-C      VITAL SIGNS:  Blood pressure 126/78, pulse 65, temperature 98.1 F (36.7 C), temperature source Oral, resp. rate 17, height 6\' 3"  (1.905 m), weight 119.7 kg (264 lb), SpO2 100 %.  PHYSICAL EXAMINATION:   Physical Exam    LABORATORY PANEL:   CBC  Recent Labs Lab 06/30/16 1530  WBC 78.3*  HGB 13.8  HCT 41.8  PLT 212   ------------------------------------------------------------------------------------------------------------------  Chemistries   Recent Labs Lab 06/30/16 1530  NA 137  K 5.1  CL 104  CO2 27  GLUCOSE 141*  BUN 14  CREATININE 1.00  CALCIUM 9.3   ------------------------------------------------------------------------------------------------------------------  Cardiac Enzymes  Recent Labs Lab 06/30/16 1530  TROPONINI <0.03   ------------------------------------------------------------------------------------------------------------------  RADIOLOGY:  Dg Chest 2 View  Result Date: 06/30/2016 CLINICAL DATA:  Pt sent over via PCP with possible DVT to right leg. Pt reports pain, swelling to RLE x 6 days, pt reports intermittent shortness of breath  on exertion. 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 Never a smoker EXAM: CHEST  2 VIEW COMPARISON:  03/28/2015 FINDINGS: Normal mediastinum and cardiac silhouette. Normal pulmonary vasculature. No evidence of effusion, infiltrate, or pneumothorax. No acute bony abnormality. IMPRESSION: Normal chest radiograph. Electronically Signed   By: Suzy Bouchard M.D.   On: 06/30/2016 16:17   US Venous Img Lower Unilateral Right  Result Date: 06/30/2016 CLINICAL DATA:  Lower extremity edema for 6 days EXAM: RIGHT LOWER EXTREMITY VENOUS DUPLEX ULTRASOUND TECHNIQUE: Gray-scale sonography with graded compression, as well as color Doppler and duplex ultrasound were performed to evaluate the right lower extremity deep venous system from the level of the common femoral vein and including the common femoral, femoral, profunda femoral, popliteal and calf veins including the posterior tibial, peroneal and gastrocnemius veins when visible. The superficial great saphenous vein was also interrogated. Spectral Doppler was utilized to evaluate flow at rest and with distal augmentation maneuvers in the common femoral, femoral and popliteal veins. COMPARISON:  None. FINDINGS: Contralateral Common Femoral Vein: Respiratory phasicity is normal and symmetric with the symptomatic side. No evidence of thrombus. Normal compressibility. Common Femoral Vein: No evidence of thrombus. Normal compressibility, respiratory phasicity and response to augmentation. Saphenofemoral Junction: No evidence of thrombus. Normal compressibility and flow on color Doppler imaging. Profunda Femoral Vein: No evidence of thrombus. Normal compressibility and flow on color Doppler imaging. Femoral Vein: There is acute appearing thrombus throughout much of the mid the distal aspect of the right femoral vein. There is loss of Doppler signal in the areas of acute thrombus. There is loss of compression and augmentation. Popliteal Vein: There is acute  thrombus throughout this vessel with loss of Doppler signal. No compression or augmentation. Calf Veins: Acute thrombus is seen throughout the visualized calf veins. There is loss of Doppler signal. No evident compression or augmentation. Superficial Great Saphenous Vein: No evidence of thrombus. Normal compressibility and flow on color Doppler imaging. Venous Reflux:  None. Other Findings: Several prominent lymph nodes are noted in the right inguinal region with central fatty hila. IMPRESSION: Extensive acute deep venous thrombosis throughout much of the right femoral vein as well as throughout the entire right popliteal vein and visualized right calf vein regions. Contralateral left common femoral vein patent. Multiple prominent lymph nodes in the right inguinal region may well have reactive etiology. These results were called by telephone at the time of interpretation on 06/30/2016 at 5:00 pm to Dr. Rudene Re , who verbally acknowledged these results. Electronically Signed   By: Lowella Grip III M.D.   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  1. Extensive acute DVT throughout much of the right femoral vein as well as throughout the right popliteal vein: Discussed case with vascular surgery Plan for thrombolysis Nothing by mouth after midnight Continue heparin drip  2. CLL Consult oncology Suspect elevated white blood cell count is  from CLL.  3. Diabetes: Sliding scale insulin and ADA diet Check hemoglobin A1c      All the records are reviewed and case discussed with ED provider. Management plans discussed with the patient and he in agreement  CODE STATUS: FULL  TOTAL TIME TAKING CARE OF THIS PATIENT: 45 minutes.    Thamara Leger M.D on 06/30/2016 at 7:36 PM  Between 7am to 6pm - Pager -  [phone]  After 6pm go to [email or web address] - password [email or web address] [facility]  Office  [phone]  CC: Primary care physician; [name], MD

## 2016-06-30 NOTE — ED Provider Notes (Signed)
[facility] Emergency Department Provider Note  ____________________________________________   First MD Initiated Contact with Patient 06/30/16 1614     (approximate)  I have reviewed the triage vital signs and the nursing notes.   HISTORY  Chief Complaint DVT   HPI Anthony Hart. is a 61 y.o. male with a history of CLL who is presenting to the emergency department today with right lower extremity swelling as well as shortness of breath with exertion over the past week. He denies any chest pain. Denies any shortness of breath at rest. Was sent in by his primary care doctor because of a concern for DVT to the right lower extremity. Patient is also a Administrator.     Past Medical History:  Diagnosis Date  . Cancer ([place])    CLL monitored   . 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   Past Surgical History:  Procedure Laterality Date  . APPENDECTOMY    . COLONOSCOPY WITH PROPOFOL  02/04/2015   Procedure: COLONOSCOPY WITH PROPOFOL;  Surgeon: [name], MD;  Location: [facility] ENDOSCOPY;  Service: Gastroenterology;;  . ESOPHAGOGASTRODUODENOSCOPY (EGD) WITH PROPOFOL N/A 02/04/2015   Procedure: ESOPHAGOGASTRODUODENOSCOPY (EGD) WITH PROPOFOL;  Surgeon: [name], MD;  Location: [facility] ENDOSCOPY;  Service: Gastroenterology;  Laterality: N/A;    Prior to Admission medications   Medication Sig Start Date End Date Taking? Authorizing Provider  metFORMIN (GLUCOPHAGE-XR) 750 MG 24 hr tablet Take 750 mg by mouth daily. 03/09/16 03/09/17 Yes Historical Provider, MD  naproxen sodium (ANAPROX) 220 MG tablet Take 220 mg by mouth 2 (two) times daily with a meal.   Yes Historical Provider, MD  azithromycin (ZITHROMAX) 250 MG tablet Take 2 tablets PO on day 1, then take 1 tablet PO daily for 4 more days Patient not taking: Reported on 06/30/2016 03/29/15   Hinda Kehr, MD  cyclobenzaprine  (FLEXERIL) 10 MG tablet Take 10 mg by mouth 3 (three) times daily as needed for muscle spasms.    Historical Provider, MD  meloxicam (MOBIC) 15 MG tablet Take 15 mg by mouth daily.    Historical Provider, MD  methylPREDNISolone (MEDROL DOSEPAK) 4 MG TBPK tablet Take Tapered dose as directed Patient not taking: Reported on 06/30/2016 11/28/14   Sable Feil, PA-C  traMADol (ULTRAM) 50 MG tablet Take 1 tablet (50 mg total) by mouth every 6 (six) hours as needed for moderate pain. Patient not taking: Reported on 06/30/2016 11/28/14   Sable Feil, PA-C    Allergies Patient has no known allergies.  No family history on file.  Social History Social History  Substance Use Topics  . Smoking status: Never Smoker  . Smokeless tobacco: Never Used  . Alcohol use No    Review of Systems Constitutional: No fever/chills Eyes: No visual changes. ENT: No sore throat. Cardiovascular: Denies chest pain. Respiratory: Denies shortness of breath. Gastrointestinal: No abdominal pain.  No nausea, no vomiting.  No diarrhea.  No constipation. Genitourinary: Negative for dysuria. Musculoskeletal: Negative for back pain. Skin: Negative for rash. Neurological: Negative for headaches, focal weakness or numbness.  10-point ROS otherwise negative.  ____________________________________________   PHYSICAL EXAM:  VITAL SIGNS: ED Triage Vitals  Enc Vitals Group     BP 06/30/16 1526 119/80     Pulse Rate 06/30/16 1526 76     Resp 06/30/16 1526 18     Temp 06/30/16  1526 98.1 F (36.7 C)     Temp Source 06/30/16 1526 Oral     SpO2 06/30/16 1526 99 %     Weight 06/30/16 1526 264 lb (119.7 kg)     Height 06/30/16 1526 6\' 3"  (1.905 m)     Head Circumference --      Peak Flow --      Pain Score 06/30/16 1527 8     Pain Loc --      Pain Edu? --      Excl. in [place]? --     Constitutional: Alert and oriented. Well appearing and in no acute distress. Eyes: Conjunctivae are normal. PERRL. EOMI. Head:  Atraumatic. Nose: No congestion/rhinnorhea. Mouth/Throat: Mucous membranes are moist.   Neck: No stridor.   Cardiovascular: Normal rate, regular rhythm. Grossly normal heart sounds.  Good peripheral circulation with intact dorsalis pedis pulse to the right lower extremity. Respiratory: Normal respiratory effort.  No retractions. Lungs CTAB. Gastrointestinal: Soft and nontender. No distention. No abdominal bruits. No CVA tenderness. Musculoskeletal: Mild diffuse right lower summary swelling with darkening of the skin but without any erythema or induration.  Neurologic:  Normal speech and language. No gross focal neurologic deficits are appreciated.  Skin:  Skin is warm, dry and intact. No rash noted. Psychiatric: Mood and affect are normal. Speech and behavior are normal.  ____________________________________________   LABS (all labs ordered are listed, but only abnormal results are displayed)  Labs Reviewed  BASIC METABOLIC PANEL - Abnormal; Notable for the following:       Result Value   Glucose, Bld 141 (*)    All other components within normal limits  CBC - Abnormal; Notable for the following:    WBC 78.3 (*)    RDW 15.1 (*)    All other components within normal limits  TROPONIN I   ____________________________________________  EKG  ED ECG REPORT I, Doran Stabler, the attending physician, personally viewed and interpreted this ECG.   Date: 06/30/2016  EKG Time: 1533  Rate: 66  Rhythm: normal sinus rhythm  Axis: Normal  Intervals:normal  ST&T Change: No ST segment elevation or depression. No abnormal T-wave inversions.  ____________________________________________  RADIOLOGY   ____________________________________________   PROCEDURES  Procedure(s) performed:   Procedures  Critical Care performed:   ____________________________________________   INITIAL IMPRESSION / ASSESSMENT AND PLAN / ED COURSE  Pertinent labs & imaging results that were  available during my care of the patient were reviewed by me and considered in my medical decision making (see chart for details).  ----------------------------------------- 6:26 PM on 06/30/2016 -----------------------------------------  Discussed case with Dr. Lucky Cowboy of the vascular surgery service who says the patient is likely appropriate to be managed either as an inpatient or an outpatient depending on if he is able to follow-up in the office. Also discussed the case with Dr. Mike Gip of oncology who says the patient also may be appropriate for outpatient follow-up. Discussed outpatient versus inpatient treatment with the patient he says that he thinks that he would rather be treated as an inpatient at this time. I think this is reasonable given that his having shortness of breath. He is denying any blood in his stool. No signs of massive pulmonary embolus. No tachycardia, hypoxia or shortness of breath or chest pain at this time.      ____________________________________________   FINAL CLINICAL IMPRESSION(S) / ED DIAGNOSES  DVT. CLL.    NEW MEDICATIONS STARTED DURING THIS VISIT:  New Prescriptions   No  medications on file     Note:  This document was prepared using Dragon voice recognition software and may include unintentional dictation errors.    Orbie Pyo, MD 06/30/16 [phone]

## 2016-06-30 NOTE — ED Notes (Signed)
Dr Clearnce Hasten notified of critical white count

## 2016-06-30 NOTE — ED Notes (Signed)
Re-sent APTT and Protime-INR to lab

## 2016-06-30 NOTE — ED Notes (Signed)
Spoke with pharmacy about heparin, states they will let the pharmacist know about the order.

## 2016-06-30 NOTE — ED Triage Notes (Signed)
Pt sent over via PCP with possible DVT to right leg.  Pt reports pain, swelling to RLE x 6 days, pt reports intermittent shortness of breath on exertion.  Pt is a truck driver, making long trips daily.

## 2016-06-30 NOTE — ED Notes (Signed)
Pt given sandwich tray and drink and advised that he is NPO according to MD at midnight tonight. Pt verbalized understanding of this.

## 2016-06-30 NOTE — ED Notes (Signed)
Pt reports that he went to [facility] today for pain in right lower leg and swelling in right lower leg for the past 6 days - his PCP felt he might have a blood clot and sent him to the er for eval and treatment

## 2016-07-01 ENCOUNTER — Inpatient Hospital Stay: Payer: Medicaid Other

## 2016-07-01 DIAGNOSIS — C9110 Chronic lymphocytic leukemia of B-cell type not having achieved remission: Secondary | ICD-10-CM

## 2016-07-01 DIAGNOSIS — Z9049 Acquired absence of other specified parts of digestive tract: Secondary | ICD-10-CM

## 2016-07-01 DIAGNOSIS — R2241 Localized swelling, mass and lump, right lower limb: Secondary | ICD-10-CM

## 2016-07-01 DIAGNOSIS — E119 Type 2 diabetes mellitus without complications: Secondary | ICD-10-CM

## 2016-07-01 DIAGNOSIS — Z79899 Other long term (current) drug therapy: Secondary | ICD-10-CM

## 2016-07-01 DIAGNOSIS — I82401 Acute embolism and thrombosis of unspecified deep veins of right lower extremity: Secondary | ICD-10-CM

## 2016-07-01 DIAGNOSIS — I82411 Acute embolism and thrombosis of right femoral vein: Secondary | ICD-10-CM

## 2016-07-01 LAB — CBC
HEMATOCRIT: 39.6 % — AB (ref 40.0–52.0)
HEMOGLOBIN: 12.9 g/dL — AB (ref 13.0–18.0)
MCH: 28.8 pg (ref 26.0–34.0)
MCHC: 32.5 g/dL (ref 32.0–36.0)
MCV: 88.6 fL (ref 80.0–100.0)
Platelets: 213 10*3/uL (ref 150–440)
RBC: 4.47 MIL/uL (ref 4.40–5.90)
RDW: 15.2 % — ABNORMAL HIGH (ref 11.5–14.5)
WBC: 78.6 10*3/uL (ref 3.8–10.6)

## 2016-07-01 LAB — BASIC METABOLIC PANEL
ANION GAP: 7 (ref 5–15)
BUN: 14 mg/dL (ref 6–20)
CALCIUM: 8.4 mg/dL — AB (ref 8.9–10.3)
CHLORIDE: 105 mmol/L (ref 101–111)
CO2: 26 mmol/L (ref 22–32)
Creatinine, Ser: 1.02 mg/dL (ref 0.61–1.24)
GFR calc non Af Amer: >60 mL/min (ref >60)
GLUCOSE: 229 mg/dL — AB (ref 65–99)
POTASSIUM: 4.6 mmol/L (ref 3.5–5.1)
Sodium: 138 mmol/L (ref 135–145)

## 2016-07-01 LAB — HEPARIN LEVEL (UNFRACTIONATED)
HEPARIN UNFRACTIONATED: 0.1 [IU]/mL — AB (ref 0.30–0.70)
HEPARIN UNFRACTIONATED: 0.38 [IU]/mL (ref 0.30–0.70)
HEPARIN UNFRACTIONATED: 0.49 [IU]/mL (ref 0.30–0.70)

## 2016-07-01 LAB — GLUCOSE, CAPILLARY
GLUCOSE-CAPILLARY: 201 mg/dL — AB (ref 65–99)
GLUCOSE-CAPILLARY: 135 mg/dL — AB (ref 65–99)
Glucose-Capillary: 147 mg/dL — ABNORMAL HIGH (ref 65–99)
Glucose-Capillary: 135 mg/dL — ABNORMAL HIGH (ref 65–99)

## 2016-07-01 MED ORDER — ACETAMINOPHEN 325 MG PO TABS: 650.0000 mg | ORAL_TABLET | Freq: Four times a day (QID) | ORAL | Status: DC | PRN

## 2016-07-01 MED ORDER — IOPAMIDOL (ISOVUE-370) INJECTION 76%
75.0000 mL | Freq: Once | INTRAVENOUS | Status: AC | PRN
  Administered 2016-07-01: 75 mL via INTRAVENOUS

## 2016-07-01 MED ORDER — SODIUM CHLORIDE 0.9 % IV SOLN: INTRAVENOUS | Status: DC

## 2016-07-01 MED ORDER — HYDROCODONE-ACETAMINOPHEN 5-325 MG PO TABS: 1.0000 | ORAL_TABLET | ORAL | Status: DC | PRN

## 2016-07-01 MED ORDER — HEPARIN BOLUS VIA INFUSION
3300.0000 [IU] | Freq: Once | INTRAVENOUS | Status: AC
  Administered 2016-07-01: 3300 [IU] via INTRAVENOUS
  Filled 2016-07-01: qty 3300

## 2016-07-01 NOTE — Progress Notes (Addendum)
ANTICOAGULATION CONSULT NOTE - Follow Up Consult  Pharmacy Consult for Heparin  Indication: DVT  No Known Allergies  Patient Measurements: Height: 6\' 3"  (190.5 cm) Weight: 256 lb 1.6 oz (116.2 kg) IBW/kg (Calculated) : 84.5 Heparin Dosing Weight:   Vital Signs: Temp: 98.2 F (36.8 C) (03/21 1157) Temp Source: Oral (03/21 1157) BP: 126/78 (03/21 1157) Pulse Rate: 60 (03/21 1157)  Labs:  Recent Labs  06/30/16 1530 06/30/16 2047 07/01/16 0334 07/01/16 1251 07/01/16 1837  HGB 13.8  --  12.9*  --   --   HCT 41.8  --  39.6*  --   --   PLT 212  --  213  --   --   APTT  --  79*  --   --   --   LABPROT  --  14.0  --   --   --   INR  --  1.08  --   --   --   HEPARINUNFRC  --   --  0.10* 0.38 0.49  CREATININE 1.00  --  1.02  --   --   TROPONINI <0.03  --   --   --   --     Estimated Creatinine Clearance: 108.5 mL/min (by C-G formula based on SCr of 1.02 mg/dL).   Medications:  Scheduled:  . insulin aspart  0-9 Units Subcutaneous TID WC  . sodium chloride flush  3 mL Intravenous Q12H    Assessment: Pharmacy consulted to dose heparin in this 61 year old male with DVT. CrCl = 108.5 ml/min  Goal of Therapy:  Heparin level 0.3-0.7 units/ml Monitor platelets by anticoagulation protocol: Yes   Plan:  3/21:  HL @ 18:37 = 0.49 Will continue this pt on current rate and recheck HL on 3/22 with AM labs.  3/22 AM heparin level 0.34. Continue current regimen. Recheck heparin level and CBC with tomorrow AM labs.  Anthony Hart,Anthony Hart 07/01/2016,7:19 PM

## 2016-07-01 NOTE — Consult Note (Signed)
ANTICOAGULATION CONSULT NOTE - Initial Consult  Pharmacy Consult for heparin drip Indication: DVT  No Known Allergies  Patient Measurements: Height: 6\' 3"  (190.5 cm) Weight: 256 lb 1.6 oz (116.2 kg) IBW/kg (Calculated) : 84.5 Heparin Dosing Weight: 109.9 kg  Vital Signs: Temp: 98.4 F (36.9 C) (03/20 2316) Temp Source: Oral (03/20 2316) BP: 133/84 (03/20 2316) Pulse Rate: 71 (03/20 2316)  Labs:  Recent Labs  06/30/16 1530 06/30/16 2047 07/01/16 0334  HGB 13.8  --  12.9*  HCT 41.8  --  39.6*  PLT 212  --  213  APTT  --  79*  --   LABPROT  --  14.0  --   INR  --  1.08  --   HEPARINUNFRC  --   --  0.10*  CREATININE 1.00  --  1.02  TROPONINI <0.03  --   --     Estimated Creatinine Clearance: 108.5 mL/min (by C-G formula based on SCr of 1.02 mg/dL).   Medical History: Past Medical History:  Diagnosis Date  . Cancer ([place])    CLL monitored   . 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 heparin  3,300 Units Intravenous Once  . insulin aspart  0-9 Units Subcutaneous TID WC  . sodium chloride flush  3 mL Intravenous Q12H    Assessment: Pt is a 61 year old male who presents with lower extremity swelling and SOB w/ exertion. 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 Pharmacy consulted to dose heparin drip. Pt is not on anticoagulation at home. Baseline labs ordered.  Goal of Therapy:  Heparin level 0.3-0.7 units/ml Monitor platelets by anticoagulation protocol: Yes   Plan:  Give 5500 units bolus x 1 Start heparin infusion at 1800 units/hr Check anti-Xa level in 6 hours and daily while on heparin Continue to monitor H&H and platelets   3/21 03:30 heparin level 0.1. 3300 unit bolus and increase rate to 2200 units/hr. Recheck in 6 hours.  Ramond Dial, Pharm.D, BCPS Clinical Pharmacist  07/01/2016,5:28 AM

## 2016-07-01 NOTE — Consult Note (Signed)
ANTICOAGULATION CONSULT NOTE - Initial Consult  Pharmacy Consult for heparin drip Indication: DVT  No Known Allergies  Patient Measurements: Height: 6\' 3"  (190.5 cm) Weight: 256 lb 1.6 oz (116.2 kg) IBW/kg (Calculated) : 84.5 Heparin Dosing Weight: 109.9 kg  Vital Signs: Temp: 98.2 F (36.8 C) (03/21 1157) Temp Source: Oral (03/21 1157) BP: 126/78 (03/21 1157) Pulse Rate: 60 (03/21 1157)  Labs:  Recent Labs  06/30/16 1530 06/30/16 2047 07/01/16 0334 07/01/16 1251  HGB 13.8  --  12.9*  --   HCT 41.8  --  39.6*  --   PLT 212  --  213  --   APTT  --  79*  --   --   LABPROT  --  14.0  --   --   INR  --  1.08  --   --   HEPARINUNFRC  --   --  0.10* 0.38  CREATININE 1.00  --  1.02  --   TROPONINI <0.03  --   --   --     Estimated Creatinine Clearance: 108.5 mL/min (by C-G formula based on SCr of 1.02 mg/dL).   Medical History: Past Medical History:  Diagnosis Date  . Cancer ([place])    CLL monitored   . 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 insulin aspart  0-9 Units Subcutaneous TID WC  . sodium chloride flush  3 mL Intravenous Q12H    Assessment: Pt is a 61 year old male who presents with lower extremity swelling and SOB w/ exertion. 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 Pharmacy consulted to dose heparin drip. Pt is not on anticoagulation at home. Baseline labs ordered.  Goal of Therapy:  Heparin level 0.3-0.7 units/ml Monitor platelets by anticoagulation protocol: Yes   Plan:  3/21 03:30 heparin level 0.1. 3300 unit bolus and increase rate to 2200 units/hr. Recheck in 6 hours.  3/21 1250 heparin level therapeutic at 0.38. Will  Continue current rate of 2200 units/hr and recheck heparin in 6 hours.   Pernell Dupre, PharmD, BCPS Clinical Pharmacist 07/01/2016 1:35 PM

## 2016-07-01 NOTE — Progress Notes (Signed)
Pt arrived via stretcher from ER with family at bedside. Pt A&O with not complaints of pain at this time. Telemetry monitor applied and called to CCMD. Heparin going @ 18. Orientated to the room, blooklet given.

## 2016-07-01 NOTE — Consult Note (Signed)
[facility] Vascular Consult Note  MRN : [id number]  Anthony Hart. is a 61 y.o. ([birth year]) male who presents with chief complaint of  Chief Complaint  Patient presents with  . DVT  .  History of Present Illness: I am asked to see the patient by Dr. Benjie Karvonen for evaluation of an extensive RLE DVT. The patient isn't having pain and swelling in his legs for about 4-5 days now. 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 Go many hours without getting out to walk. He also has CLL which is being monitored currently. He denies any current chest pain or shortness of breath. He has been on anticoagulation for approximately 24 hours now with some improvement in the pain and swelling in his leg although his pain is still moderate in nature. He has no fevers or chills. He has been elevating his legs are in the hospital which has helped the swelling as well. He has no left lower extremity symptoms. The pain is predominantly in the calf and behind the knee. A venous duplex showed extensive right lower extremity DVT throughout most of the visualized veins of the right lower extremity.  Current Facility-Administered Medications  Medication Dose Route Frequency Provider Last Rate Last Dose  . 0.9 %  sodium chloride infusion   Intravenous Continuous [name], MD 75 mL/hr at 07/01/16 1255    . acetaminophen (TYLENOL) tablet 650 mg  650 mg Oral Q6H PRN Gladstone Lighter, MD      . bisacodyl (DULCOLAX) EC tablet 5 mg  5 mg Oral Daily PRN [name], MD      . cyclobenzaprine (FLEXERIL) tablet 10 mg  10 mg Oral TID PRN [name], MD      . heparin ADULT infusion 100 units/mL (25000 units/276mL sodium chloride 0.45%)  2,200 Units/hr Intravenous Continuous Orbie Pyo, MD 22 mL/hr at 07/01/16 0608 2,200 Units/hr at 07/01/16 4098  . HYDROcodone-acetaminophen (NORCO/VICODIN) 5-325 MG per tablet 1-2 tablet  1-2 tablet Oral Q4H PRN Gladstone Lighter, MD       . insulin aspart (novoLOG) injection 0-9 Units  0-9 Units Subcutaneous TID WC [name], MD   1 Units at 07/01/16 1157  . ondansetron (ZOFRAN) tablet 4 mg  4 mg Oral Q6H PRN [name], MD       Or  . ondansetron (ZOFRAN) injection 4 mg  4 mg Intravenous Q6H PRN [name], MD      . senna-docusate (Senokot-S) tablet 1 tablet  1 tablet Oral QHS PRN [name], MD      . sodium chloride flush (NS) 0.9 % injection 3 mL  3 mL Intravenous Q12H [name], MD   3 mL at 07/01/16 0044    Past Medical History:  Diagnosis Date  . Cancer ([place])    CLL monitored   . Diabetes mellitus without complication ([place])    Type II    Past Surgical History:  Procedure Laterality Date  . APPENDECTOMY    . COLONOSCOPY WITH PROPOFOL  02/04/2015   Procedure: COLONOSCOPY WITH PROPOFOL;  Surgeon: [name], MD;  Location: [facility] ENDOSCOPY;  Service: Gastroenterology;;  . ESOPHAGOGASTRODUODENOSCOPY (EGD) WITH PROPOFOL N/A 02/04/2015   Procedure: ESOPHAGOGASTRODUODENOSCOPY (EGD) WITH PROPOFOL;  Surgeon: [name], MD;  Location: [facility] ENDOSCOPY;  Service: Gastroenterology;  Laterality: N/A;    Social History Social History  Substance Use Topics  . Smoking status: Never Smoker  . Smokeless tobacco: Never  Used  . Alcohol use No  No IV drug use Works as a Administrator  Family History No history of bleeding disorders, clotting disorders, or autoimmune diseases, or aneurysms  No Known Allergies   REVIEW OF SYSTEMS (Negative unless checked)  Constitutional: [] Weight loss  [] Fever  [] Chills Cardiac: [] Chest pain   [] Chest pressure   [] Palpitations   [] Shortness of breath when laying flat   [] Shortness of breath at rest   [] Shortness of breath with exertion. Vascular:  [x] Pain in legs with walking   [x] Pain in legs at rest   [] Pain in legs when laying flat   [] Claudication   [] Pain in feet when walking  [] Pain in feet at rest  [] Pain in feet when laying flat   [x] History of DVT   [x] Phlebitis   [x] Swelling in  legs   [] Varicose veins   [] Non-healing ulcers Pulmonary:   [] Uses home oxygen   [] Productive cough   [] Hemoptysis   [] Wheeze  [] COPD   [] Asthma Neurologic:  [] Dizziness  [] Blackouts   [] Seizures   [] History of stroke   [] History of TIA  [] Aphasia   [] Temporary blindness   [] Dysphagia   [] Weakness or numbness in arms   [] Weakness or numbness in legs Musculoskeletal:  [] Arthritis   [] Joint swelling   [] Joint pain   [] Low back pain Hematologic:  [] Easy bruising  [] Easy bleeding   [x] Hypercoagulable state   [] Anemic  [] Hepatitis Gastrointestinal:  [] Blood in stool   [] Vomiting blood  [] Gastroesophageal reflux/heartburn   [] Difficulty swallowing. Genitourinary:  [] Chronic kidney disease   [] Difficult urination  [] Frequent urination  [] Burning with urination   [] Blood in urine Skin:  [] Rashes   [] Ulcers   [] Wounds Psychological:  [] History of anxiety   []  History of major depression.  Physical Examination  Vitals:   06/30/16 2316 07/01/16 0628 07/01/16 0837 07/01/16 1157  BP: 133/84 117/78 118/79 126/78  Pulse: 71 69 71 60  Resp: 18 18 18 16   Temp: 98.4 F (36.9 C) 98.4 F (36.9 C) 97.6 F (36.4 C) 98.2 F (36.8 C)  TempSrc: Oral Oral Oral Oral  SpO2: 100% 96% 96% 96%  Weight: 116.2 kg (256 lb 1.6 oz)     Height:       Body mass index is 32.01 kg/m. Gen:  WD/WN, NAD Head: [place]/AT, No temporalis wasting.  Ear/Nose/Throat: Hearing grossly intact, nares w/o erythema or drainage, oropharynx w/o Erythema/Exudate Eyes: Sclera non-icteric, conjunctiva clear Neck: Trachea midline.  No JVD.  Pulmonary:  Good air movement, respirations not labored, equal bilaterally.  Cardiac: RRR, normal S1, S2. Vascular:  Vessel Right Left  Radial Palpable Palpable  Ulnar Palpable Palpable  Brachial Palpable Palpable  Carotid Palpable, without bruit Palpable, without bruit  Aorta Not palpable N/A  Femoral Palpable Palpable  Popliteal Palpable Palpable  PT Palpable Palpable  DP Palpable Palpable    Gastrointestinal: soft, non-tender/non-distended. No guarding/reflex.  Musculoskeletal: M/S 5/5 throughout.  Extremities without ischemic changes.  No deformity or atrophy. 1+ right lower extremity edema. No left lower extremity edema. Right calf and ankle mildly tender to palpation. Neurologic: Sensation grossly intact in extremities.  Symmetrical.  Speech is fluent. Motor exam as listed above. Psychiatric: Judgment intact, Mood & affect appropriate for pt's clinical situation. Dermatologic: No rashes or ulcers noted.  No cellulitis or open wounds. Lymph : No Cervical, Axillary, or Inguinal lymphadenopathy.      CBC Lab Results  Component Value Date   WBC 78.6 (HH) 07/01/2016   HGB 12.9 (L) 07/01/2016  HCT 39.6 (L) 07/01/2016   MCV 88.6 07/01/2016   PLT 213 07/01/2016    BMET    Component Value Date/Time   NA 138 07/01/2016 0334   NA 141 12/23/2013 1350   K 4.6 07/01/2016 0334   K 4.9 12/23/2013 1350   CL 105 07/01/2016 0334   CL 108 (H) 12/23/2013 1350   CO2 26 07/01/2016 0334   CO2 28 12/23/2013 1350   GLUCOSE 229 (H) 07/01/2016 0334   GLUCOSE 90 12/23/2013 1350   BUN 14 07/01/2016 0334   BUN 9 12/23/2013 1350   CREATININE 1.02 07/01/2016 0334   CREATININE 1.17 12/23/2013 1350   CALCIUM 8.4 (L) 07/01/2016 0334   CALCIUM 9.1 12/23/2013 1350   GFRNONAA >60 07/01/2016 0334   GFRNONAA >60 12/23/2013 1350   GFRAA >60 07/01/2016 0334   GFRAA >60 12/23/2013 1350   Estimated Creatinine Clearance: 108.5 mL/min (by C-G formula based on SCr of 1.02 mg/dL).  COAG Lab Results  Component Value Date   INR 1.08 06/30/2016    Radiology Dg Chest 2 View  Result Date: 06/30/2016 CLINICAL DATA:  Pt sent over via PCP with possible DVT to right leg. Pt reports pain, swelling to RLE x 6 days, pt reports intermittent shortness of breath on exertion. 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 Never a smoker EXAM: CHEST  2 VIEW COMPARISON:  03/28/2015 FINDINGS:  Normal mediastinum and cardiac silhouette. Normal pulmonary vasculature. No evidence of effusion, infiltrate, or pneumothorax. No acute bony abnormality. IMPRESSION: Normal chest radiograph. Electronically Signed   By: Suzy Bouchard M.D.   On: 06/30/2016 16:17   Ct Angio Chest Pe W Or Wo Contrast  Result Date: 07/01/2016 CLINICAL DATA:  Shortness of breath. Acute deep venous thrombosis right lower extremity EXAM: CT ANGIOGRAPHY CHEST WITH CONTRAST TECHNIQUE: Multidetector CT imaging of the chest was performed using the standard protocol during bolus administration of intravenous contrast. Multiplanar CT image reconstructions and MIPs were obtained to evaluate the vascular anatomy. CONTRAST:  75 mL Isovue 370 nonionic COMPARISON:  Chest radiograph June 30, 2016 FINDINGS: Cardiovascular: There are lower lobe pulmonary emboli involving multiple bilateral lower lobe pulmonary arterial segments. Pulmonary embolus arises from the distal most aspect of the left main pulmonary artery. The right ventricle to left ventricle diameter ratio is less than 0.9, not consistent with right heart strain. There is no thoracic aortic aneurysm or dissection. There are scattered foci of coronary artery calcification. Visualized great vessels appear unremarkable. Pericardium is not thickened. Mediastinum/Nodes: Thyroid appears normal. There is a mildly prominent sub- carinal lymph node measuring 1.5 x 1.5 cm. No other lymph node enlargement is evident. Lungs/Pleura: There is either scarring or atelectasis in the extreme lung apex on the left. There is patchy airspace consolidation in portions of the posterior and lateral segments the left lower lobe. There is bibasilar atelectasis as well. There is no appreciable pleural effusion or pleural thickening. No well-defined infarct evident. Upper Abdomen: Spleen is enlarged although incompletely visualized. Visualized upper abdominal structures otherwise appear unremarkable.  Musculoskeletal: There are no blastic or lytic bone lesions. No evident chest wall lesions. Review of the MIP images confirms the above findings. IMPRESSION: Multiple lower lobe pulmonary emboli bilaterally. Pulmonary embolus arises from the distal most aspect the left main pulmonary artery. No right heart strain. Patchy infiltrate posterior and lateral segments left lower lobe peripherally, most consistent with pneumonia. Areas of atelectatic change in both lower lobes as well as in the extreme  left apex. Single prominent lymph node in the sub- carinal region of uncertain etiology. No other lymph node enlargement. Incomplete visualization of splenic enlargement. Etiology for splenomegaly uncertain. Critical Value/emergent results were called by telephone at the time of interpretation on 07/01/2016 at 12:48 pm to Dr. Gladstone Lighter , who verbally acknowledged these results. Electronically Signed   By: Lowella Grip III M.D.   On: 07/01/2016 12:48   US Venous Img Lower Unilateral Right  Result Date: 06/30/2016 CLINICAL DATA:  Lower extremity edema for 6 days EXAM: RIGHT LOWER EXTREMITY VENOUS DUPLEX ULTRASOUND TECHNIQUE: Gray-scale sonography with graded compression, as well as color Doppler and duplex ultrasound were performed to evaluate the right lower extremity deep venous system from the level of the common femoral vein and including the common femoral, femoral, profunda femoral, popliteal and calf veins including the posterior tibial, peroneal and gastrocnemius veins when visible. The superficial great saphenous vein was also interrogated. Spectral Doppler was utilized to evaluate flow at rest and with distal augmentation maneuvers in the common femoral, femoral and popliteal veins. COMPARISON:  None. FINDINGS: Contralateral Common Femoral Vein: Respiratory phasicity is normal and symmetric with the symptomatic side. No evidence of thrombus. Normal compressibility. Common Femoral Vein: No evidence of  thrombus. Normal compressibility, respiratory phasicity and response to augmentation. Saphenofemoral Junction: No evidence of thrombus. Normal compressibility and flow on color Doppler imaging. Profunda Femoral Vein: No evidence of thrombus. Normal compressibility and flow on color Doppler imaging. Femoral Vein: There is acute appearing thrombus throughout much of the mid the distal aspect of the right femoral vein. There is loss of Doppler signal in the areas of acute thrombus. There is loss of compression and augmentation. Popliteal Vein: There is acute thrombus throughout this vessel with loss of Doppler signal. No compression or augmentation. Calf Veins: Acute thrombus is seen throughout the visualized calf veins. There is loss of Doppler signal. No evident compression or augmentation. Superficial Great Saphenous Vein: No evidence of thrombus. Normal compressibility and flow on color Doppler imaging. Venous Reflux:  None. Other Findings: Several prominent lymph nodes are noted in the right inguinal region with central fatty hila. IMPRESSION: Extensive acute deep venous thrombosis throughout much of the right femoral vein as well as throughout the entire right popliteal vein and visualized right calf vein regions. Contralateral left common femoral vein patent. Multiple prominent lymph nodes in the right inguinal region may well have reactive etiology. These results were called by telephone at the time of interpretation on 06/30/2016 at 5:00 pm to Dr. Rudene Re , who verbally acknowledged these results. Electronically Signed   By: Lowella Grip III M.D.   On: 06/30/2016 17:01      Assessment/Plan 1. Extensive right lower extremity DVT with pain and swelling. This is acute and his symptoms have been going on for less than a week. I discussed with the patient the options for treatment which would include 6 months of anticoagulation alone versus venous lysis and thrombectomy with IVC filter placement  and subsequent removal after the procedure. The latter would have a lowering of the postphlebitic pain and swelling in about 50% of individuals improve his long-term outcome the majority of time. The risk of bleeding is in the 1-2% range but can be severe. He voices his understanding and desires to have the procedure performed. This will be scheduled for tomorrow. 2. Leukemia. Currently being monitored without active treatment. This is still likely one of the factors increasing his coagulability including immobility from being a  truck driver. 3. Diabetes. Stable. On appropriate medications and blood glucose control important in reducing the progression of atherosclerotic disease. Also, involved in wound healing. On appropriate medications.    [name], MD  07/01/2016 1:19 PM    This note was created with Dragon medical transcription system.  Any error is purely unintentional

## 2016-07-01 NOTE — Progress Notes (Signed)
Anthony Hart at [place] NAME: Anthony Hart    MR#:  [id number]  DATE OF BIRTH:  [birth year]  SUBJECTIVE:  CHIEF COMPLAINT:   Chief Complaint  Patient presents with  . DVT   - complains of chest pain and also right leg swelling and pain - for thrombolysis tomorrow  REVIEW OF SYSTEMS:  Review of Systems  Constitutional: Negative for chills, fever and malaise/fatigue.  HENT: Negative for congestion, ear discharge, hearing loss and nosebleeds.   Eyes: Negative for blurred vision and double vision.  Respiratory: Negative for cough, shortness of breath and wheezing.   Cardiovascular: Positive for chest pain and leg swelling. Negative for palpitations.  Gastrointestinal: Negative for abdominal pain, constipation, diarrhea, nausea and vomiting.  Genitourinary: Negative for dysuria.  Musculoskeletal: Positive for myalgias.  Neurological: Negative for dizziness, sensory change, speech change, focal weakness, seizures and headaches.  Psychiatric/Behavioral: Negative for depression.    DRUG ALLERGIES:  No Known Allergies  VITALS:  Blood pressure 126/78, pulse 60, temperature 98.2 F (36.8 C), temperature source Oral, resp. rate 16, height 6\' 3"  (1.905 m), weight 116.2 kg (256 lb 1.6 oz), SpO2 96 %.  PHYSICAL EXAMINATION:  Physical Exam  GENERAL:  61 y.o.-year-old patient lying in the bed with no acute distress.  EYES: Pupils equal, round, reactive to light and accommodation. No scleral icterus. Extraocular muscles intact.  HEENT: Head atraumatic, normocephalic. Oropharynx and nasopharynx clear.  NECK:  Supple, no jugular venous distention. No thyroid enlargement, no tenderness.  LUNGS: Normal breath sounds bilaterally, no wheezing, rales,rhonchi or crepitation. No use of accessory muscles of respiration.  CARDIOVASCULAR: S1, S2 normal. No murmurs, rubs, or gallops.  ABDOMEN: Soft, nontender, nondistended. Bowel sounds present. No organomegaly  or mass.  EXTREMITIES: No cyanosis, or clubbing. Right leg is swollen and tender compared to left leg NEUROLOGIC: Cranial nerves II through XII are intact. Muscle strength 5/5 in all extremities. Sensation intact. Gait not checked.  PSYCHIATRIC: The patient is alert and oriented x 3.  SKIN: No obvious rash, lesion, or ulcer.    LABORATORY PANEL:   CBC  Recent Labs Lab 07/01/16 0334  WBC 78.6*  HGB 12.9*  HCT 39.6*  PLT 213   ------------------------------------------------------------------------------------------------------------------  Chemistries   Recent Labs Lab 07/01/16 0334  NA 138  K 4.6  CL 105  CO2 26  GLUCOSE 229*  BUN 14  CREATININE 1.02  CALCIUM 8.4*   ------------------------------------------------------------------------------------------------------------------  Cardiac Enzymes  Recent Labs Lab 06/30/16 1530  TROPONINI <0.03   ------------------------------------------------------------------------------------------------------------------  RADIOLOGY:  Dg Chest 2 View  Result Date: 06/30/2016 CLINICAL DATA:  Pt sent over via PCP with possible DVT to right leg. Pt reports pain, swelling to RLE x 6 days, pt reports intermittent shortness of breath on exertion. 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 Never a smoker EXAM: CHEST  2 VIEW COMPARISON:  03/28/2015 FINDINGS: Normal mediastinum and cardiac silhouette. Normal pulmonary vasculature. No evidence of effusion, infiltrate, or pneumothorax. No acute bony abnormality. IMPRESSION: Normal chest radiograph. Electronically Signed   By: Suzy Bouchard M.D.   On: 06/30/2016 16:17   Ct Angio Chest Pe W Or Wo Contrast  Result Date: 07/01/2016 CLINICAL DATA:  Shortness of breath. Acute deep venous thrombosis right lower extremity EXAM: CT ANGIOGRAPHY CHEST WITH CONTRAST TECHNIQUE: Multidetector CT imaging of the chest was performed using the standard protocol during bolus administration  of intravenous contrast. Multiplanar CT image reconstructions and MIPs were obtained  to evaluate the vascular anatomy. CONTRAST:  75 mL Isovue 370 nonionic COMPARISON:  Chest radiograph June 30, 2016 FINDINGS: Cardiovascular: There are lower lobe pulmonary emboli involving multiple bilateral lower lobe pulmonary arterial segments. Pulmonary embolus arises from the distal most aspect of the left main pulmonary artery. The right ventricle to left ventricle diameter ratio is less than 0.9, not consistent with right heart strain. There is no thoracic aortic aneurysm or dissection. There are scattered foci of coronary artery calcification. Visualized great vessels appear unremarkable. Pericardium is not thickened. Mediastinum/Nodes: Thyroid appears normal. There is a mildly prominent sub- carinal lymph node measuring 1.5 x 1.5 cm. No other lymph node enlargement is evident. Lungs/Pleura: There is either scarring or atelectasis in the extreme lung apex on the left. There is patchy airspace consolidation in portions of the posterior and lateral segments the left lower lobe. There is bibasilar atelectasis as well. There is no appreciable pleural effusion or pleural thickening. No well-defined infarct evident. Upper Abdomen: Spleen is enlarged although incompletely visualized. Visualized upper abdominal structures otherwise appear unremarkable. Musculoskeletal: There are no blastic or lytic bone lesions. No evident chest wall lesions. Review of the MIP images confirms the above findings. IMPRESSION: Multiple lower lobe pulmonary emboli bilaterally. Pulmonary embolus arises from the distal most aspect the left main pulmonary artery. No right heart strain. Patchy infiltrate posterior and lateral segments left lower lobe peripherally, most consistent with pneumonia. Areas of atelectatic change in both lower lobes as well as in the extreme left apex. Single prominent lymph node in the sub- carinal region of uncertain etiology.  No other lymph node enlargement. Incomplete visualization of splenic enlargement. Etiology for splenomegaly uncertain. Critical Value/emergent results were called by telephone at the time of interpretation on 07/01/2016 at 12:48 pm to Dr. Gladstone Lighter , who verbally acknowledged these results. Electronically Signed   By: Lowella Grip III M.D.   On: 07/01/2016 12:48   US Venous Img Lower Unilateral Right  Result Date: 06/30/2016 CLINICAL DATA:  Lower extremity edema for 6 days EXAM: RIGHT LOWER EXTREMITY VENOUS DUPLEX ULTRASOUND TECHNIQUE: Gray-scale sonography with graded compression, as well as color Doppler and duplex ultrasound were performed to evaluate the right lower extremity deep venous system from the level of the common femoral vein and including the common femoral, femoral, profunda femoral, popliteal and calf veins including the posterior tibial, peroneal and gastrocnemius veins when visible. The superficial great saphenous vein was also interrogated. Spectral Doppler was utilized to evaluate flow at rest and with distal augmentation maneuvers in the common femoral, femoral and popliteal veins. COMPARISON:  None. FINDINGS: Contralateral Common Femoral Vein: Respiratory phasicity is normal and symmetric with the symptomatic side. No evidence of thrombus. Normal compressibility. Common Femoral Vein: No evidence of thrombus. Normal compressibility, respiratory phasicity and response to augmentation. Saphenofemoral Junction: No evidence of thrombus. Normal compressibility and flow on color Doppler imaging. Profunda Femoral Vein: No evidence of thrombus. Normal compressibility and flow on color Doppler imaging. Femoral Vein: There is acute appearing thrombus throughout much of the mid the distal aspect of the right femoral vein. There is loss of Doppler signal in the areas of acute thrombus. There is loss of compression and augmentation. Popliteal Vein: There is acute thrombus throughout this  vessel with loss of Doppler signal. No compression or augmentation. Calf Veins: Acute thrombus is seen throughout the visualized calf veins. There is loss of Doppler signal. No evident compression or augmentation. Superficial Great Saphenous Vein: No evidence of thrombus. Normal compressibility  and flow on color Doppler imaging. Venous Reflux:  None. Other Findings: Several prominent lymph nodes are noted in the right inguinal region with central fatty hila. IMPRESSION: Extensive acute deep venous thrombosis throughout much of the right femoral vein as well as throughout the entire right popliteal vein and visualized right calf vein regions. Contralateral left common femoral vein patent. Multiple prominent lymph nodes in the right inguinal region may well have reactive etiology. These results were called by telephone at the time of interpretation on 06/30/2016 at 5:00 pm to Dr. Rudene Re , who verbally acknowledged these results. Electronically Signed   By: Lowella Grip III M.D.   On: 06/30/2016 17:01    EKG:   Orders placed or performed during the hospital encounter of 06/30/16  . ED EKG  . 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 Management plans discussed with the patient, family and they are in agreement.  CODE STATUS: Full Code  TOTAL TIME TAKING CARE OF THIS PATIENT: 37 minutes.   POSSIBLE D/C IN 1-2 DAYS, DEPENDING ON CLINICAL CONDITION.   Surina Storts M.D on 07/01/2016 at 1:15 PM  Between 7am to 6pm - Pager - [phone]  After 6pm go to [email or web address] - password [email or web address] [facility]  Office  [phone]  CC: Primary care physician; [name], MD

## 2016-07-01 NOTE — Care Management Note (Signed)
Case Management Note  Patient Details  Name: Anthony Hart. MRN: [id number] Date of Birth: 09-Aug-1955  Subjective/Objective:                 Admitted with extensive DVT and scheduled for thromblysis today.   On heparin drip. Hx of leukemia.  Medicaid [place] Access.  the following anticoagulants are on the preferred drug list for medicaid- Coumadin, Eliquis,Xarelto, Lovenox (brand name), Pradaxa. Independent in all adls, denies issues accessing medical care, obtaining medications or with transportation.   Action/Plan:   Expected Discharge Date:                  Expected Discharge Plan:     In-House Referral:     Discharge planning Services     Post Acute Care Choice:    Choice offered to:     DME Arranged:    DME Agency:     HH Arranged:    HH Agency:     Status of Service:     If discussed at [employer] of Stay Meetings, dates discussed:    Additional Comments:  Katrina Stack, RN 07/01/2016, 8:39 AM

## 2016-07-01 NOTE — Consult Note (Signed)
[facility] CONSULT NOTE  Patient Care Team: [name], MD as PCP - General (Family Medicine)  CHIEF COMPLAINTS/PURPOSE OF CONSULTATION: DVT; CLL  HISTORY OF PRESENTING ILLNESS:  Anthony Hart. 61 y.o.  male history of CLL asymptomatic currently on surveillance [follows up with Dr. [name]- was admitted to the hospital for right lower extremity DVT.  Patient who is a Administrator by profession- noted to have swelling of the right lower extremity for the last week or so. This is associated with cramping. He denies any unusual shortness of breath or cough. Denies any significant weight loss. Denies any nausea vomiting abdominal pain.  Course in hospital- right lower extremity ultrasound Doppler showed extensive DVT of the right lower extremity/right femoral vein. Patient has been started on IV heparin; he has been evaluated by Vascular with the plan for thrombectomy in the morning. CT scan of the chest shows bilateral PE without any right heart strain.  Patient notes mild improvement of his right lower extremity pain.  Patient does not have prior history of DVT or PE. His son had a DVT at the age of 50. Otherwise denies any family history of DVTs. Patient does not smoke.  ROS: A complete 10 point review of system is done which is negative except mentioned above in history of present illness  MEDICAL HISTORY:  Past Medical History:  Diagnosis Date  . Cancer ([place])    CLL monitored   . Diabetes mellitus without complication ([place])    Type II    SURGICAL HISTORY: Past Surgical History:  Procedure Laterality Date  . APPENDECTOMY    . COLONOSCOPY WITH PROPOFOL  02/04/2015   Procedure: COLONOSCOPY WITH PROPOFOL;  Surgeon: [name], MD;  Location: [facility] ENDOSCOPY;  Service: Gastroenterology;;  . ESOPHAGOGASTRODUODENOSCOPY (EGD) WITH PROPOFOL N/A 02/04/2015   Procedure: ESOPHAGOGASTRODUODENOSCOPY (EGD) WITH PROPOFOL;  Surgeon: [name], MD;  Location: [facility]  ENDOSCOPY;  Service: Gastroenterology;  Laterality: N/A;    SOCIAL HISTORY: Social History   Social History  . Marital status: Single    Spouse name: N/A  . Number of children: N/A  . Years of education: N/A   Occupational History  . Not on file.   Social History Main Topics  . Smoking status: Never Smoker  . Smokeless tobacco: Never Used  . Alcohol use No  . Drug use: No  . Sexual activity: Not on file   Other Topics Concern  . Not on file   Social History Narrative  . No narrative on file    FAMILY HISTORY: No family history on file.  ALLERGIES:  has No Known Allergies.  MEDICATIONS:  Current Facility-Administered Medications  Medication Dose Route Frequency Provider Last Rate Last Dose  . 0.9 %  sodium chloride infusion   Intravenous Continuous [name], MD 75 mL/hr at 07/01/16 1255    . 0.9 %  sodium chloride infusion   Intravenous Continuous [name], MD      . acetaminophen (TYLENOL) tablet 650 mg  650 mg Oral Q6H PRN Gladstone Lighter, MD      . bisacodyl (DULCOLAX) EC tablet 5 mg  5 mg Oral Daily PRN [name], MD      . cyclobenzaprine (FLEXERIL) tablet 10 mg  10 mg Oral TID PRN [name], MD      . heparin ADULT infusion 100 units/mL (25000 units/264mL sodium chloride 0.45%)  2,200 Units/hr Intravenous Continuous Orbie Pyo, MD 22 mL/hr at 07/01/16 1514 2,200 Units/hr  at 07/01/16 1514  . HYDROcodone-acetaminophen (NORCO/VICODIN) 5-325 MG per tablet 1-2 tablet  1-2 tablet Oral Q4H PRN Gladstone Lighter, MD      . insulin aspart (novoLOG) injection 0-9 Units  0-9 Units Subcutaneous TID WC [name], MD   3 Units at 07/01/16 1652  . ondansetron (ZOFRAN) tablet 4 mg  4 mg Oral Q6H PRN [name], MD       Or  . ondansetron (ZOFRAN) injection 4 mg  4 mg Intravenous Q6H PRN [name], MD      . senna-docusate (Senokot-S) tablet 1 tablet  1 tablet Oral QHS PRN [name], MD      . sodium chloride flush (NS) 0.9 % injection 3 mL  3 mL  Intravenous Q12H [name], MD   3 mL at 07/01/16 0044      .  PHYSICAL EXAMINATION:  Vitals:   07/01/16 0837 07/01/16 1157  BP: 118/79 126/78  Pulse: 71 60  Resp: 18 16  Temp: 97.6 F (36.4 C) 98.2 F (36.8 C)   Filed Weights   06/30/16 1526 06/30/16 2316  Weight: 264 lb (119.7 kg) 256 lb 1.6 oz (116.2 kg)    GENERAL: Well-nourished well-developed; Alert, no distress and comfortable.   Alone. EYES: no pallor or icterus OROPHARYNX: no thrush or ulceration. NECK: supple, no masses felt LYMPH:  no palpable lymphadenopathy in the cervical, axillary or inguinal regions LUNGS: decreased breath sounds to auscultation at bases and  No wheeze or crackles HEART/CVS: regular rate & rhythm and no murmurs; swelling and tenderness of the right lower extremity. No discoloration. Pulses intact. ABDOMEN: abdomen soft, non-tender and normal bowel sounds Musculoskeletal:no cyanosis of digits and no clubbing  PSYCH: alert & oriented x 3 with fluent speech NEURO: no focal motor/sensory deficits SKIN:  no rashes or significant lesions  LABORATORY DATA:  I have reviewed the data as listed Lab Results  Component Value Date   WBC 78.6 (HH) 07/01/2016   HGB 12.9 (L) 07/01/2016   HCT 39.6 (L) 07/01/2016   MCV 88.6 07/01/2016   PLT 213 07/01/2016    Recent Labs  06/30/16 1530 07/01/16 0334  NA 137 138  K 5.1 4.6  CL 104 105  CO2 27 26  GLUCOSE 141* 229*  BUN 14 14  CREATININE 1.00 1.02  CALCIUM 9.3 8.4*  GFRNONAA >60 >60  GFRAA >60 >60    RADIOGRAPHIC STUDIES: I have personally reviewed the radiological images as listed and agreed with the findings in the report. Dg Chest 2 View  Result Date: 06/30/2016 CLINICAL DATA:  Pt sent over via PCP with possible DVT to right leg. Pt reports pain, swelling to RLE x 6 days, pt reports intermittent shortness of breath on exertion. 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 Normal pulmonary vasculature. No evidence of effusion, infiltrate, or pneumothorax. No acute bony abnormality. IMPRESSION: Normal chest radiograph. Electronically Signed   By: Suzy Bouchard M.D.   On: 06/30/2016 16:17   Ct Angio Chest Pe W Or Wo Contrast  Result Date: 07/01/2016 CLINICAL DATA:  Shortness of breath. Acute deep venous thrombosis right lower extremity EXAM: CT ANGIOGRAPHY CHEST WITH CONTRAST TECHNIQUE: Multidetector CT imaging of the chest was performed using the standard protocol during bolus administration of intravenous contrast. Multiplanar CT image reconstructions and MIPs were obtained to evaluate the vascular anatomy. CONTRAST:  75 mL  Isovue 370 nonionic COMPARISON:  Chest radiograph June 30, 2016 FINDINGS: Cardiovascular: There are lower lobe pulmonary emboli involving multiple bilateral lower lobe pulmonary arterial segments. Pulmonary embolus arises from the distal most aspect of the left main pulmonary artery. The right ventricle to left ventricle diameter ratio is less than 0.9, not consistent with right heart strain. There is no thoracic aortic aneurysm or dissection. There are scattered foci of coronary artery calcification. Visualized great vessels appear unremarkable. Pericardium is not thickened. Mediastinum/Nodes: Thyroid appears normal. There is a mildly prominent sub- carinal lymph node measuring 1.5 x 1.5 cm. No other lymph node enlargement is evident. Lungs/Pleura: There is either scarring or atelectasis in the extreme lung apex on the left. There is patchy airspace consolidation in portions of the posterior and lateral segments the left lower lobe. There is bibasilar atelectasis as well. There is no appreciable pleural effusion or pleural thickening. No well-defined infarct evident. Upper Abdomen: Spleen is enlarged although incompletely visualized. Visualized upper abdominal structures otherwise appear  unremarkable. Musculoskeletal: There are no blastic or lytic bone lesions. No evident chest wall lesions. Review of the MIP images confirms the above findings. IMPRESSION: Multiple lower lobe pulmonary emboli bilaterally. Pulmonary embolus arises from the distal most aspect the left main pulmonary artery. No right heart strain. Patchy infiltrate posterior and lateral segments left lower lobe peripherally, most consistent with pneumonia. Areas of atelectatic change in both lower lobes as well as in the extreme left apex. Single prominent lymph node in the sub- carinal region of uncertain etiology. No other lymph node enlargement. Incomplete visualization of splenic enlargement. Etiology for splenomegaly uncertain. Critical Value/emergent results were called by telephone at the time of interpretation on 07/01/2016 at 12:48 pm to Dr. Gladstone Lighter , who verbally acknowledged these results. Electronically Signed   By: Lowella Grip III M.D.   On: 07/01/2016 12:48   US Venous Img Lower Unilateral Right  Result Date: 06/30/2016 CLINICAL DATA:  Lower extremity edema for 6 days EXAM: RIGHT LOWER EXTREMITY VENOUS DUPLEX ULTRASOUND TECHNIQUE: Gray-scale sonography with graded compression, as well as color Doppler and duplex ultrasound were performed to evaluate the right lower extremity deep venous system from the level of the common femoral vein and including the common femoral, femoral, profunda femoral, popliteal and calf veins including the posterior tibial, peroneal and gastrocnemius veins when visible. The superficial great saphenous vein was also interrogated. Spectral Doppler was utilized to evaluate flow at rest and with distal augmentation maneuvers in the common femoral, femoral and popliteal veins. COMPARISON:  None. FINDINGS: Contralateral Common Femoral Vein: Respiratory phasicity is normal and symmetric with the symptomatic side. No evidence of thrombus. Normal compressibility. Common Femoral Vein:  No evidence of thrombus. Normal compressibility, respiratory phasicity and response to augmentation. Saphenofemoral Junction: No evidence of thrombus. Normal compressibility and flow on color Doppler imaging. Profunda Femoral Vein: No evidence of thrombus. Normal compressibility and flow on color Doppler imaging. Femoral Vein: There is acute appearing thrombus throughout much of the mid the distal aspect of the right femoral vein. There is loss of Doppler signal in the areas of acute thrombus. There is loss of compression and augmentation. Popliteal Vein: There is acute thrombus throughout this vessel with loss of Doppler signal. No compression or augmentation. Calf Veins: Acute thrombus is seen throughout the visualized calf veins. There is loss of Doppler signal. No evident compression or augmentation. Superficial Great Saphenous Vein: No evidence of thrombus. Normal compressibility and flow on color Doppler imaging. Venous Reflux:  None. Other Findings: Several prominent lymph nodes are noted in the right inguinal region with central fatty hila. IMPRESSION: Extensive acute deep venous thrombosis throughout much of the right femoral vein as well as throughout the entire right popliteal vein and visualized right calf vein regions. Contralateral left common femoral vein patent. Multiple prominent lymph nodes in the right inguinal region may well have reactive etiology. These results were called by telephone at the time of interpretation on 06/30/2016 at 5:00 pm to Dr. Rudene Re , who verbally acknowledged these results. Electronically Signed   By: Lowella Grip III M.D.   On: 06/30/2016 17:01    ASSESSMENT & PLAN:   # 61 year old male patient with extensive DVT of the right lower extremity/CLL.  # Right lower extremity DVT/PE- etiology is unclear. 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 We will need outpatient workup. Given the acute/symptomatic  extensive patient would benefit from thrombectomy/ thrombolysis- attempt to improve postphlebitic syndrome. However patient would benefit from at least one year of anti-coagulation.  # CLL- white count 70,000/ normal hemoglobin and platelet count.- Continue surveillance.   # Thank you Dr.Kalisetti  for allowing me to participate in the care of your pleasant patient. Please do not hesitate to contact me with questions or concerns in the interim. All questions were answered. The patient knows to call the clinic with any problems, questions or concerns.    Cammie Sickle, MD 07/01/2016 7:55 PM

## 2016-07-02 ENCOUNTER — Encounter
Admission: EM | Disposition: A | Discharge status: Home / Self Care | Payer: Self-pay | Source: Home / Self Care | Attending: Internal Medicine

## 2016-07-02 DIAGNOSIS — I82401 Acute embolism and thrombosis of unspecified deep veins of right lower extremity: Secondary | ICD-10-CM

## 2016-07-02 HISTORY — PX: PERIPHERAL VASCULAR THROMBECTOMY: CATH118306

## 2016-07-02 LAB — CBC
HEMATOCRIT: 39.4 % — AB (ref 40.0–52.0)
HEMOGLOBIN: 12.8 g/dL — AB (ref 13.0–18.0)
MCH: 29 pg (ref 26.0–34.0)
MCHC: 32.6 g/dL (ref 32.0–36.0)
MCV: 89 fL (ref 80.0–100.0)
Platelets: 211 10*3/uL (ref 150–440)
RBC: 4.42 MIL/uL (ref 4.40–5.90)
RDW: 15.2 % — AB (ref 11.5–14.5)
WBC: 78.5 10*3/uL — AB (ref 3.8–10.6)

## 2016-07-02 LAB — BASIC METABOLIC PANEL
ANION GAP: 7 (ref 5–15)
BUN: 13 mg/dL (ref 6–20)
CALCIUM: 8.2 mg/dL — AB (ref 8.9–10.3)
CHLORIDE: 108 mmol/L (ref 101–111)
CO2: 23 mmol/L (ref 22–32)
Creatinine, Ser: 0.86 mg/dL (ref 0.61–1.24)
GFR calc non Af Amer: >60 mL/min (ref >60)
Glucose, Bld: 172 mg/dL — ABNORMAL HIGH (ref 65–99)
Potassium: 4.3 mmol/L (ref 3.5–5.1)
Sodium: 138 mmol/L (ref 135–145)

## 2016-07-02 LAB — GLUCOSE, CAPILLARY
GLUCOSE-CAPILLARY: 143 mg/dL — AB (ref 65–99)
GLUCOSE-CAPILLARY: 86 mg/dL (ref 65–99)
Glucose-Capillary: 155 mg/dL — ABNORMAL HIGH (ref 65–99)
Glucose-Capillary: 188 mg/dL — ABNORMAL HIGH (ref 65–99)

## 2016-07-02 LAB — HEMOGLOBIN A1C
Hgb A1c MFr Bld: 7.8 % — ABNORMAL HIGH (ref 4.8–5.6)
MEAN PLASMA GLUCOSE: 177 mg/dL

## 2016-07-02 LAB — HEPARIN LEVEL (UNFRACTIONATED): HEPARIN UNFRACTIONATED: 0.34 [IU]/mL (ref 0.30–0.70)

## 2016-07-02 LAB — HIV ANTIBODY (ROUTINE TESTING W REFLEX): HIV Screen 4th Generation wRfx: NONREACTIVE

## 2016-07-02 SURGERY — PERIPHERAL VASCULAR THROMBECTOMY
Anesthesia: Moderate Sedation | Laterality: Right

## 2016-07-02 MED ORDER — DEXTROSE 5 % IV SOLN
1.5000 g | Freq: Once | INTRAVENOUS | Status: AC
  Administered 2016-07-02: 1.5 g via INTRAVENOUS

## 2016-07-02 MED ORDER — FENTANYL CITRATE (PF) 100 MCG/2ML IJ SOLN
INTRAMUSCULAR | Status: AC
  Filled 2016-07-02: qty 2

## 2016-07-02 MED ORDER — HEPARIN SODIUM (PORCINE) 1000 UNIT/ML IJ SOLN
INTRAMUSCULAR | Status: DC | PRN
  Administered 2016-07-02: 4000 [IU] via INTRAVENOUS

## 2016-07-02 MED ORDER — ALTEPLASE 2 MG IJ SOLR
INTRAMUSCULAR | Status: DC | PRN
  Administered 2016-07-02: 10 mg

## 2016-07-02 MED ORDER — MIDAZOLAM HCL 5 MG/5ML IJ SOLN
INTRAMUSCULAR | Status: AC
  Filled 2016-07-02: qty 5

## 2016-07-02 MED ORDER — SODIUM CHLORIDE 0.9 % IV SOLN
INTRAVENOUS | Status: DC | PRN
  Administered 2016-07-02: 500 mL via INTRAVENOUS

## 2016-07-02 MED ORDER — MIDAZOLAM HCL 2 MG/2ML IJ SOLN
INTRAMUSCULAR | Status: DC | PRN
Start: 2016-07-02 — End: 2016-07-02
  Administered 2016-07-02 (×3): 1 mg
  Administered 2016-07-02: 2 mg via INTRAVENOUS

## 2016-07-02 MED ORDER — HEPARIN (PORCINE) IN NACL 2-0.9 UNIT/ML-% IJ SOLN
INTRAMUSCULAR | Status: AC
  Filled 2016-07-02: qty 500

## 2016-07-02 MED ORDER — LIDOCAINE-EPINEPHRINE (PF) 2 %-1:200000 IJ SOLN
INTRAMUSCULAR | Status: AC
  Filled 2016-07-02: qty 20

## 2016-07-02 MED ORDER — FENTANYL CITRATE (PF) 100 MCG/2ML IJ SOLN
INTRAMUSCULAR | Status: DC | PRN
  Administered 2016-07-02 (×3): 50 ug
  Administered 2016-07-02: 50 ug via INTRAVENOUS

## 2016-07-02 MED ORDER — HEPARIN SODIUM (PORCINE) 1000 UNIT/ML IJ SOLN
INTRAMUSCULAR | Status: AC
  Filled 2016-07-02: qty 1

## 2016-07-02 SURGICAL SUPPLY — 19 items
BALLN ARMADA 10X80X80 (BALLOONS) ×3
BALLN ARMADA 14X80X80 (BALLOONS) ×3
BALLOON ARMADA 10X80X80 (BALLOONS) ×1 IMPLANT
BALLOON ARMADA 14X80X80 (BALLOONS) ×1 IMPLANT
CANISTER PENUMBRA MAX (MISCELLANEOUS) ×3 IMPLANT
CANNULA 5F STIFF (CANNULA) ×3 IMPLANT
CATH BEACON 5.038 65CM KMP-01 (CATHETERS) ×3 IMPLANT
CATH INDIGO D 50CM (CATHETERS) ×3 IMPLANT
DEVICE PRESTO INFLATION (MISCELLANEOUS) ×3 IMPLANT
DEVICE SAFEGUARD 24CM (GAUZE/BANDAGES/DRESSINGS) ×3 IMPLANT
DEVICE TORQUE (MISCELLANEOUS) ×3 IMPLANT
FILTER VC CELECT-FEMORAL (Filter) ×3 IMPLANT
PACK ANGIOGRAPHY (CUSTOM PROCEDURE TRAY) ×3 IMPLANT
SHEATH BRITE TIP 5FRX11 (SHEATH) IMPLANT
SHEATH BRITE TIP 8FRX11 (SHEATH) ×3 IMPLANT
SUT MNCRL AB 4-0 PS2 18 (SUTURE) ×3 IMPLANT
SYR MEDRAD MARK V 150ML (SYRINGE) ×3 IMPLANT
WIRE J 3MM .035X145CM (WIRE) ×3 IMPLANT
WIRE MAGIC TOR.035 180C (WIRE) ×3 IMPLANT

## 2016-07-02 NOTE — Op Note (Signed)
[facility]   OPERATIVE NOTE   PRE-OPERATIVE DIAGNOSIS: extensive RLE DVT  POST-OPERATIVE DIAGNOSIS: same   PROCEDURE: 1. US guidance for vascular access to right femoral vein and right popliteal vein 2. Catheter placement into IVC from right femoral vein approach and right popliteal vein approach 3. IVC gram and right lower extremity venogram 4. IVC filter placement 5.   Catheter directed thrombolysis with right popliteal, superficial femoral, and common femoral veins with 10 mg of TPA 6. Mechanical thrombectomy to right popliteal, superficial femoral, and common femoral veins with the penumbra cat D device 7. PTA of right popliteal and superficial femoral veins with 10 mm balloon 8. PTA of right common iliac vein with 14 mm balloon   SURGEON: [name], MD  ASSISTANT(S): none  ANESTHESIA: local with moderate conscious sedation for 75 minutes using 3 mg of Versed and 150 mcg of Fentanyl  ESTIMATED BLOOD LOSS: 200 cc  FINDING(S): 1. Extensive right lower extremity DVT in the right popliteal, superficial femoral, and common femoral veins. 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 male who presents with extensive right lower extremity DVT. Patient has marked leg swelling and pain. Venous intervention is performed to reduce the symtpoms and avoid long term postphlebitic symptoms.   DESCRIPTION: After obtaining full informed written consent, the patient was brought back to the vascular suite and placed supine upon the table.Moderate conscious sedation was administered during a face to face encounter with the patient throughout the procedure with my supervision of the RN administering medicines and monitoring the patient's vital signs, pulse oximetry, telemetry and mental status throughout from the start of the procedure until the patient was taken to the recovery room. After  obtaining adequate anesthesia, the patient was prepped and draped in the standard fashion. The right femoral vein was then accessed under US guidance. It was accessed without difficulty and a permanent image was recorded. I then placed the delivery sheath into the IVC. The IVC was patent and the renal veins were at the level of L1. The Cook Celect IVC filter was then deployed at the level of L1 just below the renal veins. The delivery sheath was then removed and dressings were placed in the right groin.  The patient was then placed into the prone position. The right popliteal vein was then accessed under direct ultrasound guidance without difficulty with a micropuncture needle and a permanent image was recorded. I then upsized to an 8Fr sheath over a J wire. 4000 units of heparin were then given. Imaging showed extensive DVT with minimal flow. A Kumpe catheter and Advantage wire were then advanced into the CFV and images were performed. The iliac veins did not appear to have thrombus in them, but there was a dilated right external iliac vein with what appeared to be a 70-80% stenosis in the right common iliac vein. This may have created a low-flow state predisposing him to thrombosis distally. I was able to cross the thrombus and stenosis and advance into the IVC which was patent. I then used the Kumpe catheter and instilled 10 mg of tpa throughout the popliteal vein, SFV, CFV, and iliac veins.  After this dwelled for 15 minutes, I used the penumbra cat D catheter and evacuated about 200-300 cc of effluent with mechanical thrombectomy throughout the CFV, SFV, and popliteal veins. Several passes were done and a large amount of thrombus was removed. This had mild improvement.  I then treated the popliteal vein and SFV with 3 inflations with a 10 mm diameter by 80 mm length angioplasty balloon to open a channel where the largest amount of remaining thrombus was which was in the popliteal and  superficial femoral veins. Each inflation was for 8-12 atm for about a minute. The more proximal femoral vein and common femoral vein were now patent.. This resulted in some resolution of the thrombus and improved flow. I then turned my attention to the iliac veins. The stenosis in the right common iliac vein was treated with a 14 mm diameter by 8 cm length angioplasty balloon.  A significant waist was seen which resolved at about 4-6 atm and this was held for 1 minute. Completion venogram showed only about a 25-30% residual stenosis and this was the largest noncompliant balloon we had. I then elected to terminate the procedure. The sheath was removed and a dressing was placed. She was taken to the recovery room in stable condition having tolerated the procedure well.   COMPLICATIONS: None  CONDITION: Stable  [name] 07/02/2016 4:43 PM

## 2016-07-02 NOTE — Progress Notes (Signed)
[facility] [place] NAME: Jhovany Weidinger    MR#:  [id number]  DATE OF BIRTH:  [birth year]  SUBJECTIVE: Seen at the bedside., Going for thrombolysis today. Denies chest pain or shortness of breath.   CHIEF COMPLAINT:   Chief Complaint  Patient presents with  . DVT   -   REVIEW OF SYSTEMS:  Review of Systems  Constitutional: Negative for chills, fever and malaise/fatigue.  HENT: Negative for congestion, ear discharge, hearing loss and nosebleeds.   Eyes: Negative for blurred vision and double vision.  Respiratory: Negative for cough, shortness of breath and wheezing.   Cardiovascular: Positive for leg swelling. Negative for chest pain and palpitations.  Gastrointestinal: Negative for abdominal pain, constipation, diarrhea, nausea and vomiting.  Genitourinary: Negative for dysuria.  Musculoskeletal: Negative for myalgias.  Neurological: Negative for dizziness, sensory change, speech change, focal weakness, seizures and headaches.  Psychiatric/Behavioral: Negative for depression.    DRUG ALLERGIES:  No Known Allergies  VITALS:  Blood pressure 123/76, pulse 62, temperature 98.3 F (36.8 C), temperature source Oral, resp. rate 16, height 6\' 3"  (1.905 m), weight 116.2 kg (256 lb 1.6 oz), SpO2 94 %.  PHYSICAL EXAMINATION:  Physical Exam  GENERAL:  61 y.o.-year-old patient lying in the bed with no acute distress.  EYES: Pupils equal, round, reactive to light and accommodation. No scleral icterus. Extraocular muscles intact.  HEENT: Head atraumatic, normocephalic. Oropharynx and nasopharynx clear.  NECK:  Supple, no jugular venous distention. No thyroid enlargement, no tenderness.  LUNGS: Normal breath sounds bilaterally, no wheezing, rales,rhonchi or crepitation. No use of accessory muscles of respiration.  CARDIOVASCULAR: S1, S2 normal. No murmurs, rubs, or gallops.  ABDOMEN: Soft, nontender, nondistended. Bowel sounds present. No  organomegaly or mass.  EXTREMITIES: No cyanosis, or clubbing. Right leg is swollen and tender compared to left leg NEUROLOGIC: Cranial nerves II through XII are intact. Muscle strength 5/5 in all extremities. Sensation intact. Gait not checked.  PSYCHIATRIC: The patient is alert and oriented x 3.  SKIN: No obvious rash, lesion, or ulcer.    LABORATORY PANEL:   CBC  Recent Labs Lab 07/02/16 0321  WBC 78.5*  HGB 12.8*  HCT 39.4*  PLT 211   ------------------------------------------------------------------------------------------------------------------  Chemistries   Recent Labs Lab 07/02/16 0321  NA 138  K 4.3  CL 108  CO2 23  GLUCOSE 172*  BUN 13  CREATININE 0.86  CALCIUM 8.2*   ------------------------------------------------------------------------------------------------------------------  Cardiac Enzymes  Recent Labs Lab 06/30/16 1530  TROPONINI <0.03   ------------------------------------------------------------------------------------------------------------------  RADIOLOGY:  Dg Chest 2 View  Result Date: 06/30/2016 CLINICAL DATA:  Pt sent over via PCP with possible DVT to right leg. Pt reports pain, swelling to RLE x 6 days, pt reports intermittent shortness of breath on exertion. 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 Never a smoker EXAM: CHEST  2 VIEW COMPARISON:  03/28/2015 FINDINGS: Normal mediastinum and cardiac silhouette. Normal pulmonary vasculature. No evidence of effusion, infiltrate, or pneumothorax. No acute bony abnormality. IMPRESSION: Normal chest radiograph. Electronically Signed   By: Suzy Bouchard M.D.   On: 06/30/2016 16:17   Ct Angio Chest Pe W Or Wo Contrast  Result Date: 07/01/2016 CLINICAL DATA:  Shortness of breath. Acute deep venous thrombosis right lower extremity EXAM: CT ANGIOGRAPHY CHEST WITH CONTRAST TECHNIQUE: Multidetector CT imaging of the chest was performed using the standard protocol during bolus  administration of intravenous contrast. Multiplanar CT image reconstructions and MIPs  were obtained to evaluate the vascular anatomy. CONTRAST:  75 mL Isovue 370 nonionic COMPARISON:  Chest radiograph June 30, 2016 FINDINGS: Cardiovascular: There are lower lobe pulmonary emboli involving multiple bilateral lower lobe pulmonary arterial segments. Pulmonary embolus arises from the distal most aspect of the left main pulmonary artery. The right ventricle to left ventricle diameter ratio is less than 0.9, not consistent with right heart strain. There is no thoracic aortic aneurysm or dissection. There are scattered foci of coronary artery calcification. Visualized great vessels appear unremarkable. Pericardium is not thickened. Mediastinum/Nodes: Thyroid appears normal. There is a mildly prominent sub- carinal lymph node measuring 1.5 x 1.5 cm. No other lymph node enlargement is evident. Lungs/Pleura: There is either scarring or atelectasis in the extreme lung apex on the left. There is patchy airspace consolidation in portions of the posterior and lateral segments the left lower lobe. There is bibasilar atelectasis as well. There is no appreciable pleural effusion or pleural thickening. No well-defined infarct evident. Upper Abdomen: Spleen is enlarged although incompletely visualized. Visualized upper abdominal structures otherwise appear unremarkable. Musculoskeletal: There are no blastic or lytic bone lesions. No evident chest wall lesions. Review of the MIP images confirms the above findings. IMPRESSION: Multiple lower lobe pulmonary emboli bilaterally. Pulmonary embolus arises from the distal most aspect the left main pulmonary artery. No right heart strain. Patchy infiltrate posterior and lateral segments left lower lobe peripherally, most consistent with pneumonia. Areas of atelectatic change in both lower lobes as well as in the extreme left apex. Single prominent lymph node in the sub- carinal region of  uncertain etiology. No other lymph node enlargement. Incomplete visualization of splenic enlargement. Etiology for splenomegaly uncertain. Critical Value/emergent results were called by telephone at the time of interpretation on 07/01/2016 at 12:48 pm to Dr. Gladstone Lighter , who verbally acknowledged these results. Electronically Signed   By: Lowella Grip III M.D.   On: 07/01/2016 12:48   US Venous Img Lower Unilateral Right  Result Date: 06/30/2016 CLINICAL DATA:  Lower extremity edema for 6 days EXAM: RIGHT LOWER EXTREMITY VENOUS DUPLEX ULTRASOUND TECHNIQUE: Gray-scale sonography with graded compression, as well as color Doppler and duplex ultrasound were performed to evaluate the right lower extremity deep venous system from the level of the common femoral vein and including the common femoral, femoral, profunda femoral, popliteal and calf veins including the posterior tibial, peroneal and gastrocnemius veins when visible. The superficial great saphenous vein was also interrogated. Spectral Doppler was utilized to evaluate flow at rest and with distal augmentation maneuvers in the common femoral, femoral and popliteal veins. COMPARISON:  None. FINDINGS: Contralateral Common Femoral Vein: Respiratory phasicity is normal and symmetric with the symptomatic side. No evidence of thrombus. Normal compressibility. Common Femoral Vein: No evidence of thrombus. Normal compressibility, respiratory phasicity and response to augmentation. Saphenofemoral Junction: No evidence of thrombus. Normal compressibility and flow on color Doppler imaging. Profunda Femoral Vein: No evidence of thrombus. Normal compressibility and flow on color Doppler imaging. Femoral Vein: There is acute appearing thrombus throughout much of the mid the distal aspect of the right femoral vein. There is loss of Doppler signal in the areas of acute thrombus. There is loss of compression and augmentation. Popliteal Vein: There is acute thrombus  throughout this vessel with loss of Doppler signal. No compression or augmentation. Calf Veins: Acute thrombus is seen throughout the visualized calf veins. There is loss of Doppler signal. No evident compression or augmentation. Superficial Great Saphenous Vein: No evidence of thrombus.  Normal compressibility and flow on color Doppler imaging. Venous Reflux:  None. Other Findings: Several prominent lymph nodes are noted in the right inguinal region with central fatty hila. IMPRESSION: Extensive acute deep venous thrombosis throughout much of the right femoral vein as well as throughout the entire right popliteal vein and visualized right calf vein regions. Contralateral left common femoral vein patent. Multiple prominent lymph nodes in the right inguinal region may well have reactive etiology. These results were called by telephone at the time of interpretation on 06/30/2016 at 5:00 pm to Dr. Rudene Re , who verbally acknowledged these results. Electronically Signed   By: Lowella Grip III M.D.   On: 06/30/2016 17:01    EKG:   Orders placed or performed during the hospital encounter of 06/30/16  . ED EKG  . 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 -thrombolysis,, IVC filter placement today.*start  oral anticoagulants after the procedure and likely discharge tomorrow.-  #2 CLL- stable, outpatient follow up with oncology  #3 DM- hold metformin as received contrast with CT - continue SSI  #4 DVT Prophylaxis- on heparin drip   All the records are reviewed and case discussed  with Care Management/Social Workerr. Management plans discussed with the patient, family and they are in agreement.  CODE STATUS: Full Code  TOTAL TIME TAKING CARE OF THIS PATIENT: 37 minutes.   POSSIBLE D/C IN 1-2 DAYS, DEPENDING ON CLINICAL CONDITION.   Epifanio Lesches M.D on 07/02/2016 at 1:11 PM  Between 7am to 6pm - Pager - [phone]  After 6pm go to [email or web address] - password [email or web address] [facility]  Office  [phone]  CC: Primary care physician; [name], MD

## 2016-07-02 NOTE — Progress Notes (Signed)
Report by lori to [facility] telemetry.  Check right groin for bleeding or hematoma.  Patient will be on bedrest for 1 hours post sheath pull---out of bed at 19:15.  Bilateral pulses are 2's DP's..  Dr. Lucky Cowboy has been paged to put in post orders.

## 2016-07-02 NOTE — OR Nursing (Signed)
Has a large area of a rash on back.

## 2016-07-02 NOTE — Progress Notes (Addendum)
ANTICOAGULATION CONSULT NOTE - Follow Up Consult  Pharmacy Consult for Heparin  Indication: DVT  No Known Allergies  Patient Measurements: Height: 6\' 3"  (190.5 cm) Weight: 256 lb 1.6 oz (116.2 kg) IBW/kg (Calculated) : 84.5 Heparin Dosing Weight:   Vital Signs: Temp: 97.8 F (36.6 C) (03/22 1756) Temp Source: Oral (03/22 1756) BP: 133/78 (03/22 1756) Pulse Rate: 66 (03/22 1756)  Labs:  Recent Labs  06/30/16 1530 06/30/16 2047  07/01/16 0334 07/01/16 1251 07/01/16 1837 07/02/16 0321  HGB 13.8  --   --  12.9*  --   --  12.8*  HCT 41.8  --   --  39.6*  --   --  39.4*  PLT 212  --   --  213  --   --  211  APTT  --  79*  --   --   --   --   --   LABPROT  --  14.0  --   --   --   --   --   INR  --  1.08  --   --   --   --   --   HEPARINUNFRC  --   --   < > 0.10* 0.38 0.49 0.34  CREATININE 1.00  --   --  1.02  --   --  0.86  TROPONINI <0.03  --   --   --   --   --   --   < > = values in this interval not displayed.  Estimated Creatinine Clearance: 128.7 mL/min (by C-G formula based on SCr of 0.86 mg/dL).   Medications:  Scheduled:  . insulin aspart  0-9 Units Subcutaneous TID WC  . sodium chloride flush  3 mL Intravenous Q12H    Assessment: Pharmacy consulted to dose heparin in this 61 year old male with DVT. CrCl = 108.5 ml/min  Goal of Therapy:  Heparin level 0.3-0.7 units/ml Monitor platelets by anticoagulation protocol: Yes   Plan:  3/21:  HL @ 18:37 = 0.49 Will continue this pt on current rate and recheck HL on 3/22 with AM labs.  3/22 AM heparin level 0.34. Continue current regimen. Recheck heparin level and CBC with tomorrow AM labs.  3/23  Heparin gtt restarted following vascular surgery.  Per Dr Lucky Cowboy, will resume previous rate with no bolus. Will recheck HL 6 hrs after restart.   3/23 01:30 heparin level 0.32. Continue current regimen. Recheck heparin level and CBC with tomorrow AM labs.  Sim Boast, PharmD, BCPS  07/03/16

## 2016-07-02 NOTE — H&P (Signed)
[facility] History & Physical Update  The patient was interviewed and re-examined.  The patient's previous History and Physical has been reviewed and is unchanged.  There is no change in the plan of care. We plan to proceed with the scheduled procedure.  [name], MD  07/02/2016, 3:34 PM

## 2016-07-03 ENCOUNTER — Encounter: Payer: Self-pay | Admitting: Vascular Surgery

## 2016-07-03 DIAGNOSIS — I82401 Acute embolism and thrombosis of unspecified deep veins of right lower extremity: Secondary | ICD-10-CM

## 2016-07-03 LAB — CBC
HEMATOCRIT: 41.2 % (ref 40.0–52.0)
HEMOGLOBIN: 13.6 g/dL (ref 13.0–18.0)
MCH: 29.4 pg (ref 26.0–34.0)
MCHC: 32.9 g/dL (ref 32.0–36.0)
MCV: 89.2 fL (ref 80.0–100.0)
Platelets: 220 10*3/uL (ref 150–440)
RBC: 4.62 MIL/uL (ref 4.40–5.90)
RDW: 14.9 % — ABNORMAL HIGH (ref 11.5–14.5)
WBC: 84.5 10*3/uL — AB (ref 3.8–10.6)

## 2016-07-03 LAB — HEPARIN LEVEL (UNFRACTIONATED): HEPARIN UNFRACTIONATED: 0.32 [IU]/mL (ref 0.30–0.70)

## 2016-07-03 LAB — GLUCOSE, CAPILLARY
GLUCOSE-CAPILLARY: 178 mg/dL — AB (ref 65–99)
Glucose-Capillary: 150 mg/dL — ABNORMAL HIGH (ref 65–99)

## 2016-07-03 MED ORDER — APIXABAN 5 MG PO TABS
5.0000 mg | ORAL_TABLET | Freq: Two times a day (BID) | ORAL | Status: DC
  Administered 2016-07-03: 5 mg via ORAL
  Filled 2016-07-03 (×2): qty 1

## 2016-07-03 MED ORDER — APIXABAN 5 MG PO TABS: 10.0000 mg | ORAL_TABLET | Freq: Two times a day (BID) | ORAL | 1 refills | Status: DC

## 2016-07-03 MED ORDER — APIXABAN 5 MG PO TABS
5.0000 mg | ORAL_TABLET | Freq: Once | ORAL | Status: AC
  Administered 2016-07-03: 5 mg via ORAL

## 2016-07-03 NOTE — Care Management (Signed)
No discharge needs.  Provided with Eliquis 30 day coupon. Patient confirms medicaid is active.

## 2016-07-03 NOTE — Progress Notes (Signed)
Patient discharged via wheelchair and private vehicle. IV removed and catheter intact. All discharge instructions given and patient verbalizes understanding. Tele removed and returned. No prescriptions given to patient No distress noted.   

## 2016-07-03 NOTE — Progress Notes (Signed)
Heaton Beather Arbour.   DOB:17-Dec-1955   NU#:[id number]    Subjective: Patient denies any difficulty breathing. No cough. No hemoptysis. Patient underwent thrombolysis in thrombectomy of the right lower extremity extensive DVT. He continues to be in IV heparin. Significant improvement in the swelling of his right lower extremity.  Objective:  Vitals:   07/03/16 0515 07/03/16 0731  BP: 124/84 130/75  Pulse: 71 68  Resp: (!) 26 18  Temp: 98.3 F (36.8 C) 98.5 F (36.9 C)     Intake/Output Summary (Last 24 hours) at 07/03/16 1616 Last data filed at 07/03/16 0900  Gross per 24 hour  Intake          2910.73 ml  Output             1050 ml  Net          1860.73 ml    GENERAL: Well-nourished well-developed; Alert, no distress and comfortable.   Alone. EYES: no pallor or icterus OROPHARYNX: no thrush or ulceration. NECK: supple, no masses felt LYMPH:  no palpable lymphadenopathy in the cervical, axillary or inguinal regions LUNGS: decreased breath sounds to auscultation at bases and  No wheeze or crackles HEART/CVS: regular rate & rhythm and no murmurs; wrapped in ace bandage/swelling and tenderness of the right lower extremity. No discoloration. Pulses intact. ABDOMEN: abdomen soft, non-tender and normal bowel sounds Musculoskeletal:no cyanosis of digits and no clubbing  PSYCH: alert & oriented x 3 with fluent speech NEURO: no focal motor/sensory deficits SKIN:  no rashes or significant lesions   Labs:  Lab Results  Component Value Date   WBC 84.5 (HH) 07/03/2016   HGB 13.6 07/03/2016   HCT 41.2 07/03/2016   MCV 89.2 07/03/2016   PLT 220 07/03/2016   NEUTROABS 4.4 11/15/2014    Lab Results  Component Value Date   NA 138 07/02/2016   K 4.3 07/02/2016   CL 108 07/02/2016   CO2 23 07/02/2016    Studies:  No results found.  Assessment & Plan:   # 61 year old male patient with extensive DVT of the right lower extremity/CLL.  # Right lower extremity DVT/PE- etiology is  unclear. Question provoked. Status post thrombolysis/thrombectomy status post IVC filter. Recommend anticoagulation with oral novel anticoagulants. Discussed that eliquis/xarelto- are reasonable options. However also discussed regarding avoiding falls; trauma- as unfortunately it could result in fatal bleeding. Also discussed that these agents cannot be reversed; but fortunately have a short half-life. Would recommend at least one year of anti-coagulation. Also need outpatient workup for hypercoagulable state. Discussed with Dr.Konidena.   # CLL- white count 70,000/ normal hemoglobin and platelet count.- Continue surveillance.  # Recommend follow-up with Dr. [name] in approximately one to 2 weeks. Pt agrees.    Cammie Sickle, MD 07/03/2016  4:16 PM

## 2016-07-03 NOTE — Discharge Instructions (Signed)
Apixaban oral tablets °What is this medicine? °APIXABAN (a PIX a ban) is an anticoagulant (blood thinner). It is used to lower the chance of stroke in people with a medical condition called atrial fibrillation. It is also used to treat or prevent blood clots in the lungs or in the veins. °This medicine may be used for other purposes; ask your health care provider or pharmacist if you have questions. °COMMON BRAND NAME(S): Eliquis °What should I tell my health care provider before I take this medicine? °They need to know if you have any of these conditions: °-bleeding disorders °-bleeding in the brain °-blood in your stools (black or tarry stools) or if you have blood in your vomit °-history of stomach bleeding °-kidney disease °-liver disease °-mechanical heart valve °-an unusual or allergic reaction to apixaban, other medicines, foods, dyes, or preservatives °-pregnant or trying to get pregnant °-breast-feeding °How should I use this medicine? °Take this medicine by mouth with a glass of water. Follow the directions on the prescription label. You can take it with or without food. If it upsets your stomach, take it with food. Take your medicine at regular intervals. Do not take it more often than directed. Do not stop taking except on your doctor's advice. Stopping this medicine may increase your risk of a blot clot. Be sure to refill your prescription before you run out of medicine. °Talk to your pediatrician regarding the use of this medicine in children. Special care may be needed. °Overdosage: If you think you have taken too much of this medicine contact a poison control center or emergency room at once. °NOTE: This medicine is only for you. Do not share this medicine with others. °What if I miss a dose? °If you miss a dose, take it as soon as you can. If it is almost time for your next dose, take only that dose. Do not take double or extra doses. °What may interact with this medicine? °This medicine may  interact with the following: °-aspirin and aspirin-like medicines °-certain medicines for fungal infections like ketoconazole and itraconazole °-certain medicines for seizures like carbamazepine and phenytoin °-certain medicines that treat or prevent blood clots like warfarin, enoxaparin, and dalteparin °-clarithromycin °-NSAIDs, medicines for pain and inflammation, like ibuprofen or naproxen °-rifampin °-ritonavir °-St. John's wort °This list may not describe all possible interactions. Give your health care provider a list of all the medicines, herbs, non-prescription drugs, or dietary supplements you use. Also tell them if you smoke, drink alcohol, or use illegal drugs. Some items may interact with your medicine. °What should I watch for while using this medicine? °Visit your doctor or health care professional for regular checks on your progress. °Notify your doctor or health care professional and seek emergency treatment if you develop breathing problems; changes in vision; chest pain; severe, sudden headache; pain, swelling, warmth in the leg; trouble speaking; sudden numbness or weakness of the face, arm or leg. These can be signs that your condition has gotten worse. °If you are going to have surgery or other procedure, tell your doctor that you are taking this medicine. °What side effects may I notice from receiving this medicine? °Side effects that you should report to your doctor or health care professional as soon as possible: °-allergic reactions like skin rash, itching or hives, swelling of the face, lips, or tongue °-signs and symptoms of bleeding such as bloody or black, tarry stools; red or dark-brown urine; spitting up blood or brown material that looks like coffee   grounds; red spots on the skin; unusual bruising or bleeding from the eye, gums, or nose This list may not describe all possible side effects. Call your doctor for medical advice about side effects. You may report side effects to FDA at  1-800-FDA-1088. Where should I keep my medicine? Keep out of the reach of children. Store at room temperature between 20 and 25 degrees C (68 and 77 degrees F). Throw away any unused medicine after the expiration date. NOTE: This sheet is a summary. It may not cover all possible information. If you have questions about this medicine, talk to your doctor, pharmacist, or health care provider.  2018 Elsevier/Gold Standard (2015-10-21 11:54:23) Deep Vein Thrombosis A deep vein thrombosis (DVT) is a blood clot (thrombus) that usually occurs in a deep, larger vein of the lower leg or the pelvis, or in an upper extremity such as the arm. These are dangerous and can lead to serious and even life-threatening complications if the clot travels to the lungs. A DVT can damage the valves in your leg veins so that instead of flowing upward, the blood pools in the lower leg. This is called post-thrombotic syndrome, and it can result in pain, swelling, discoloration, and sores on the leg. What are the causes? A DVT is caused by the formation of a blood clot in your leg, pelvis, or arm. Usually, several things contribute to the formation of blood clots. A clot may develop when:  Your blood flow slows down.  Your vein becomes damaged in some way.  You have a condition that makes your blood clot more easily. What increases the risk? A DVT is more likely to develop in:  People who are older, especially over 55 years of age.  People who are overweight (obese).  People who sit or lie still for a long time, such as during long-distance travel (over 4 hours), bed rest, hospitalization, or during recovery from certain medical conditions like a stroke.  People who do not engage in much physical activity (sedentary lifestyle).  People who have chronic breathing disorders.  People who have a personal or family history of blood clots or blood clotting disease.  People who have peripheral vascular disease (PVD),  diabetes, or some types of cancer.  People who have heart disease, especially if the person had a recent heart attack or has congestive heart failure.  People who have neurological diseases that affect the legs (leg paresis).  People who have had a traumatic injury, such as breaking a hip or leg.  People who have recently had major or lengthy surgery, especially on the hip, knee, or abdomen.  People who have had a central line placed inside a large vein.  People who take medicines that contain the hormone estrogen. These include birth control pills and hormone replacement therapy.  Pregnancy or during childbirth or the postpartum period.  Long plane flights (over 8 hours). What are the signs or symptoms?   Symptoms of a DVT can include:  Swelling of your leg or arm, especially if one side is much worse.  Warmth and redness of your leg or arm, especially if one side is much worse.  Pain in your arm or leg. If the clot is in your leg, symptoms may be more noticeable or worse when you stand or walk.  A feeling of pins and needles, if the clot is in the arm. The symptoms of a DVT that has traveled to the lungs (pulmonary embolism, PE) usually start suddenly and  include:  Shortness of breath while active or at rest.  Coughing or coughing up blood or blood-tinged mucus.  Chest pain that is often worse with deep breaths.  Rapid or irregular heartbeat.  Feeling light-headed or dizzy.  Fainting.  Feeling anxious.  Sweating. There may also be pain and swelling in a leg if that is where the blood clot started. These symptoms may represent a serious problem that is an emergency. Do not wait to see if the symptoms will go away. Get medical help right away. Call your local emergency services (911 in the [place] Do not drive yourself to the hospital.  How is this diagnosed? Your health care provider will take a medical history and perform a physical exam. You may also have other  tests, including:  Blood tests to assess the clotting properties of your blood.  Imaging tests, such as CT, ultrasound, MRI, X-ray, and other tests to see if you have clots anywhere in your body. How is this treated? After a DVT is identified, it can be treated. The type of treatment that you receive depends on many factors, such as the cause of your DVT, your risk for bleeding or developing more clots, and other medical conditions that you have. Sometimes, a combination of treatments is necessary. Treatment options may be combined and include:  Monitoring the blood clot with ultrasound.  Taking medicines by mouth, such as newer blood thinners (anticoagulants), thrombolytics, or warfarin.  Taking anticoagulant medicine by injection or through an IV tube.  Wearing compression stockings or using different types ofdevices.  Surgery (rare) to remove the blood clot or to place a filter in your abdomen to stop the blood clot from traveling to your lungs. Treatments for a DVT are often divided into immediate treatment and long-term treatment (up to 3 months after DVT). You can work with your health care provider to choose the treatment program that is best for you. Follow these instructions at home: If you are taking a newer oral anticoagulant:   Take the medicine every single day at the same time each day.  Understand what foods and drugs interact with this medicine.  Understand that there are no regular blood tests required when using this medicine.  Understand the side effects of this medicine, including excessive bruising or bleeding. Ask your health care provider or pharmacist about other possible side effects. If you are taking warfarin:   Understand how to take warfarin and know which foods can affect how warfarin works in Veterinary surgeon.  Understand that it is dangerous to take too much or too little warfarin. Too much warfarin increases the risk of bleeding. Too little warfarin  continues to allow the risk for blood clots.  Follow your PT and INR blood testing schedule. The PT and INR results allow your health care provider to adjust your dose of warfarin. It is very important that you have your PT and INR tested as often as told by your health care provider.  Avoid major changes in your diet, or tell your health care provider before you change your diet. Arrange a visit with a registered dietitian to answer your questions. Many foods, especially foods that are high in vitamin K, can interfere with warfarin and affect the PT and INR results. Eat a consistent amount of foods that are high in vitamin K, such as:  Spinach, kale, broccoli, cabbage, collard greens, turnip greens, Brussels sprouts, peas, cauliflower, seaweed, and parsley.  Beef liver and pork liver.  Green tea.  Soybean oil.  Tell your health care provider about any and all medicines, vitamins, and supplements that you take, including aspirin and other over-the-counter anti-inflammatory medicines. Be especially cautious with aspirin and anti-inflammatory medicines. Do not take those before you ask your health care provider if it is safe to do so. This is important because many medicines can interfere with warfarin and affect the PT and INR results.  Do not start or stop taking any over-the-counter or prescription medicine unless your health care provider or pharmacist tells you to do so. If you take warfarin, you will also need to do these things:  Hold pressure over cuts for longer than usual.  Tell your dentist and other health care providers that you are taking warfarin before you have any procedures in which bleeding may occur.  Avoid alcohol or drink very small amounts. Tell your health care provider if you change your alcohol intake.  Do not use tobacco products, including cigarettes, chewing tobacco, and e-cigarettes. If you need help quitting, ask your health care provider.  Avoid contact  sports. General instructions   Take over-the-counter and prescription medicines only as told by your health care provider. Anticoagulant medicines can have side effects, including easy bruising and difficulty stopping bleeding. If you are prescribed an anticoagulant, you will also need to do these things:  Hold pressure over cuts for longer than usual.  Tell your dentist and other health care providers that you are taking anticoagulants before you have any procedures in which bleeding may occur.  Avoid contact sports.  Wear a medical alert bracelet or carry a medical alert card that says you have had a PE.  Ask your health care provider how soon you can go back to your normal activities. Stay active to prevent new blood clots from forming.  Make sure to exercise while traveling or when you have been sitting or standing for a long period of time. It is very important to exercise. Exercise your legs by walking or by tightening and relaxing your leg muscles often. Take frequent walks.  Wear compression stockings as told by your health care provider to help prevent more blood clots from forming.  Do not use tobacco products, including cigarettes, chewing tobacco, and e-cigarettes. If you need help quitting, ask your health care provider.  Keep all follow-up appointments with your health care provider. This is important. How is this prevented? Take these actions to decrease your risk of developing another DVT:  Exercise regularly. For at least 30 minutes every day, engage in:  Activity that involves moving your arms and legs.  Activity that encourages good blood flow through your body by increasing your heart rate.  Exercise your arms and legs every hour during long-distance travel (over 4 hours). Drink plenty of water and avoid drinking alcohol while traveling.  Avoid sitting or lying in bed for long periods of time without moving your legs.  Maintain a weight that is appropriate for  your height. Ask your health care provider what weight is healthy for you.  If you are a woman who is over 51 years of age, avoid unnecessary use of medicines that contain estrogen. These include birth control pills.  Do not smoke, especially if you take estrogen medicines. If you need help quitting, ask your health care provider. If you are hospitalized, prevention measures may include:  Early walking after surgery, as soon as your health care provider says that it is safe.  Receiving anticoagulants to prevent blood clots.If you cannot  take anticoagulants, other options may be available, such as wearing compression stockings or using different types of devices. Get help right away if:  You have new or increased pain, swelling, or redness in an arm or leg.  You have numbness or tingling in an arm or leg.  You have shortness of breath while active or at rest.  You have chest pain.  You have a rapid or irregular heartbeat.  You feel light-headed or dizzy.  You cough up blood.  You notice blood in your vomit, bowel movement, or urine. These symptoms may represent a serious problem that is an emergency. Do not wait to see if the symptoms will go away. Get medical help right away. Call your local emergency services (911 in the [place] Do not drive yourself to the hospital. This information is not intended to replace advice given to you by your health care provider. Make sure you discuss any questions you have with your health care provider. Document Released: 03/30/2005 Document Revised: 09/05/2015 Document Reviewed: 07/25/2014 Elsevier Interactive Patient Education  2017 [employer].

## 2016-07-03 NOTE — Progress Notes (Signed)
[facility]  Daily Progress Note  Subjective: 1 Day Post-Op: US guidance for vascular access to right femoral vein and right popliteal vein,  Catheter placement into IVC from right femoral vein approach and right popliteal vein approach,  IVC gram and right lower extremity venogram, IVC filter placement, Catheter directed thrombolysis with right popliteal, superficial femoral, and common femoral veins with 10 mg of TPA, Mechanical thrombectomy to right popliteal, superficial femoral, and common femoral veins with the penumbra cat D device, PTA of right popliteal and superficial femoral veins with 10 mm balloon with PTA of right common iliac vein with 14 mm balloon.  Patient resting comfortably in bed. Without complaint this AM. Improvement in right lower extremity discomfort.   Objective: Vitals:   07/02/16 1756 07/02/16 2013 07/03/16 0515 07/03/16 0731  BP: 133/78 134/76 124/84 130/75  Pulse: 66 77 71 68  Resp: 18 19 (!) 26 18  Temp: 97.8 F (36.6 C) 97.8 F (36.6 C) 98.3 F (36.8 C) 98.5 F (36.9 C)  TempSrc: Oral Oral Oral Oral  SpO2: 100% 98% 97% 97%  Weight:      Height:        Intake/Output Summary (Last 24 hours) at 07/03/16 1150 Last data filed at 07/03/16 0900  Gross per 24 hour  Intake          2910.73 ml  Output             1350 ml  Net          1560.73 ml   Physical Exam: A&Ox3, NAD CV: RRR Pulmonary: CTA Bilaterally Abdomen: Soft, Nontender, Nondistended Groin: Pressure device in place. No swelling or drainage noted in area. Vascular:  Right Lower Extremity: In wraps up to thigh. Non-tender to palpation. Toes warm. Sensation and motor intact.   Laboratory: CBC    Component Value Date/Time   WBC 84.5 (HH) 07/03/2016 0128   HGB 13.6 07/03/2016 0128   HGB 14.3 01/08/2014 1509   HCT 41.2 07/03/2016 0128   HCT 45.0 01/08/2014 1509   PLT 220 07/03/2016 0128   PLT 138 (L) 01/08/2014 1509   BMET    Component Value Date/Time   NA 138  07/02/2016 0321   NA 141 12/23/2013 1350   K 4.3 07/02/2016 0321   K 4.9 12/23/2013 1350   CL 108 07/02/2016 0321   CL 108 (H) 12/23/2013 1350   CO2 23 07/02/2016 0321   CO2 28 12/23/2013 1350   GLUCOSE 172 (H) 07/02/2016 0321   GLUCOSE 90 12/23/2013 1350   BUN 13 07/02/2016 0321   BUN 9 12/23/2013 1350   CREATININE 0.86 07/02/2016 0321   CREATININE 1.17 12/23/2013 1350   CALCIUM 8.2 (L) 07/02/2016 0321   CALCIUM 9.1 12/23/2013 1350   GFRNONAA >60 07/02/2016 0321   GFRNONAA >60 12/23/2013 1350   GFRAA >60 07/02/2016 0321   GFRAA >60 12/23/2013 1350   Assessment/Planning: 61 year old male with 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  2) Patient to follow up in our office - information placed in discharge section. 3) Eliquis PO. 4) Discussed with Dr. Ellis Parents [facility] PA-C 07/03/2016 11:50 AM

## 2016-07-06 NOTE — Discharge Summary (Signed)
Anthony Hart., is a 61 y.o. male  DOB Dec 10, 1955  MRN [id number].  Admission date:  06/30/2016  Admitting Physician  [name], MD  Discharge Date:  07/03/2016   Primary MD  [name], MD  Recommendations for primary care physician for things to follow:  Follow-up with primary doctor in 1 week.. Follow-up with  Dr. [name]  In 2 weeks.  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 Cancer ([place])    CLL monitored   . Diabetes mellitus without complication ([place])    Type II    Past Surgical History:  Procedure Laterality Date  . APPENDECTOMY    . COLONOSCOPY WITH PROPOFOL  02/04/2015   Procedure: COLONOSCOPY WITH PROPOFOL;  Surgeon: [name], MD;  Location: [facility] ENDOSCOPY;  Service: Gastroenterology;;  . ESOPHAGOGASTRODUODENOSCOPY (EGD) WITH PROPOFOL N/A 02/04/2015   Procedure: ESOPHAGOGASTRODUODENOSCOPY (EGD) WITH PROPOFOL;  Surgeon: [name], MD;  Location: [facility] ENDOSCOPY;  Service: Gastroenterology;  Laterality: N/A;  . PERIPHERAL VASCULAR THROMBECTOMY Right 07/02/2016   Procedure: Peripheral Vascular Thrombectomy and IVC filter;  Surgeon: [name], MD;  Location: [place] CV LAB;  Service: Cardiovascular;  Laterality: Right;       History of present illness and  Hospital Course:     Kindly see H&P for history of  present illness and admission details, please review complete Labs, Consult reports and Test reports for all details in brief  HPI  from the history and physical done on the day of admission  60 year old male patient with history of CLL, diabetes mellitus came in because of right leg swelling, pain or shortness of breath. He is a Administrator, has sedentary lifestyle.  Hospital Course   #1 extensive acute DVT throughout much of the right femoral and right popliteal veins: And also by bilateral pulmonary embolus. I vascular, patient had thrombolysis and thrombectomy status post IVC filter. Discharge to home with oral anticoagulants with Eliquis Advised the patient to take 10 mg of eliquis  was by mouth twice a day for 7 days, followed by 5 mg by mouth twice a day indefinitely.  #2 diabetes mellitus type 2; on metformin XL 750 mg daily, resume this. #3. History of CLL: White count 70,000. Continue surveillance monitoring, seen by oncology. And follow up with oncology Dr. [name] in 2 weeks after discharge. Patient is aware of this.   Discharge Condition: stable   Follow UP  Follow-up Information    [name], MD Follow up.   Specialties:  Vascular Surgery, Radiology, Interventional Cardiology Why:  RLE DVT Thrombolysis and IVC filter Insertion ([place]). Patient will need RLE Venous Duplex (DVT) and IVC Duplex.  Contact information: [place] [address] [phone]        Olmedo, [name], MD Follow up in 1 week(s).   Specialty:  Family Medicine Contact information: [place] [address] [phone]        [name], MD Follow up.   Specialty:  Oncology Contact information: [id number]  [place] [address] [phone]             Discharge Instructions  and  Discharge Medications      Allergies as of 07/03/2016   No Known Allergies     Medication List    STOP taking these medications   azithromycin  250 MG tablet Commonly known as:  ZITHROMAX   methylPREDNISolone 4 MG Tbpk tablet Commonly known as:  MEDROL DOSEPAK     TAKE these medications   apixaban 5 MG Tabs tablet Commonly known as:  ELIQUIS Take 2 tablets (10 mg total) by mouth 2 (two) times daily. 10 mg po BID for 7 days followed by 5 mg po BID after that.   cyclobenzaprine 10 MG tablet Commonly known as:  FLEXERIL Take 10 mg by mouth 3 (three) times daily as needed for muscle spasms.   meloxicam 15 MG tablet Commonly known as:  MOBIC Take 15 mg by mouth daily.   metFORMIN 750 MG 24 hr tablet Commonly known as:  GLUCOPHAGE-XR Take 750 mg by mouth daily.   naproxen sodium 220 MG tablet Commonly known as:  ANAPROX Take 220 mg by mouth 2 (two) times daily with a meal.   traMADol 50 MG tablet Commonly known as:  ULTRAM Take 1 tablet (50 mg total) by mouth every 6 (six) hours as needed for moderate pain.         Diet and Activity recommendation: See Discharge Instructions above   Consults obtained -Vascular surgery, oncology   Major procedures and Radiology Reports - PLEASE review detailed and final reports for all details, in brief -      Dg Chest 2 View  Result Date: 06/30/2016 CLINICAL DATA:  Pt sent over via PCP with possible DVT to right leg. Pt reports pain, swelling to RLE x 6 days, pt reports intermittent shortness of breath on exertion. 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 Never a smoker EXAM: CHEST  2 VIEW COMPARISON:  03/28/2015 FINDINGS: Normal mediastinum and cardiac silhouette. Normal pulmonary vasculature. No evidence of effusion, infiltrate, or pneumothorax. No acute bony abnormality. IMPRESSION: Normal chest radiograph. Electronically Signed   By: Suzy Bouchard M.D.   On: 06/30/2016 16:17   Ct Angio Chest Pe W Or Wo Contrast  Result Date: 07/01/2016 CLINICAL DATA:  Shortness of breath. Acute deep venous thrombosis right lower extremity EXAM: CT ANGIOGRAPHY CHEST WITH  CONTRAST TECHNIQUE: Multidetector CT imaging of the chest was performed using the standard protocol during bolus administration of intravenous contrast. Multiplanar CT image reconstructions and MIPs were obtained to evaluate the vascular anatomy. CONTRAST:  75 mL Isovue 370 nonionic COMPARISON:  Chest radiograph June 30, 2016 FINDINGS: Cardiovascular: There are lower lobe pulmonary emboli involving multiple bilateral lower lobe pulmonary arterial segments. Pulmonary embolus arises from the distal most aspect of the left main pulmonary artery. The right ventricle to left ventricle diameter ratio is less than 0.9, not consistent with right heart strain. There is no thoracic aortic aneurysm or dissection. There are scattered foci of coronary artery calcification. Visualized great vessels appear unremarkable. Pericardium is not thickened. Mediastinum/Nodes: Thyroid appears normal. There is a mildly prominent sub- carinal lymph node measuring 1.5 x 1.5 cm. No other lymph node enlargement is evident. Lungs/Pleura: There is either scarring or atelectasis in the extreme lung apex on the left. There is patchy airspace consolidation in portions of the posterior and lateral segments the left lower lobe. There is bibasilar atelectasis as well.  There is no appreciable pleural effusion or pleural thickening. No well-defined infarct evident. Upper Abdomen: Spleen is enlarged although incompletely visualized. Visualized upper abdominal structures otherwise appear unremarkable. Musculoskeletal: There are no blastic or lytic bone lesions. No evident chest wall lesions. Review of the MIP images confirms the above findings. IMPRESSION: Multiple lower lobe pulmonary emboli bilaterally. Pulmonary embolus arises from the distal most aspect the left main pulmonary artery. No right heart strain. Patchy infiltrate posterior and lateral segments left lower lobe peripherally, most consistent with pneumonia. Areas of atelectatic change in both  lower lobes as well as in the extreme left apex. Single prominent lymph node in the sub- carinal region of uncertain etiology. No other lymph node enlargement. Incomplete visualization of splenic enlargement. Etiology for splenomegaly uncertain. Critical Value/emergent results were called by telephone at the time of interpretation on 07/01/2016 at 12:48 pm to Dr. Gladstone Lighter , who verbally acknowledged these results. Electronically Signed   By: Lowella Grip III M.D.   On: 07/01/2016 12:48   US Venous Img Lower Unilateral Right  Result Date: 06/30/2016 CLINICAL DATA:  Lower extremity edema for 6 days EXAM: RIGHT LOWER EXTREMITY VENOUS DUPLEX ULTRASOUND TECHNIQUE: Gray-scale sonography with graded compression, as well as color Doppler and duplex ultrasound were performed to evaluate the right lower extremity deep venous system from the level of the common femoral vein and including the common femoral, femoral, profunda femoral, popliteal and calf veins including the posterior tibial, peroneal and gastrocnemius veins when visible. The superficial great saphenous vein was also interrogated. Spectral Doppler was utilized to evaluate flow at rest and with distal augmentation maneuvers in the common femoral, femoral and popliteal veins. COMPARISON:  None. FINDINGS: Contralateral Common Femoral Vein: Respiratory phasicity is normal and symmetric with the symptomatic side. No evidence of thrombus. Normal compressibility. Common Femoral Vein: No evidence of thrombus. Normal compressibility, respiratory phasicity and response to augmentation. Saphenofemoral Junction: No evidence of thrombus. Normal compressibility and flow on color Doppler imaging. Profunda Femoral Vein: No evidence of thrombus. Normal compressibility and flow on color Doppler imaging. Femoral Vein: There is acute appearing thrombus throughout much of the mid the distal aspect of the right femoral vein. There is loss of Doppler signal in the  areas of acute thrombus. There is loss of compression and augmentation. Popliteal Vein: There is acute thrombus throughout this vessel with loss of Doppler signal. No compression or augmentation. Calf Veins: Acute thrombus is seen throughout the visualized calf veins. There is loss of Doppler signal. No evident compression or augmentation. Superficial Great Saphenous Vein: No evidence of thrombus. Normal compressibility and flow on color Doppler imaging. Venous Reflux:  None. Other Findings: Several prominent lymph nodes are noted in the right inguinal region with central fatty hila. IMPRESSION: Extensive acute deep venous thrombosis throughout much of the right femoral vein as well as throughout the entire right popliteal vein and visualized right calf vein regions. Contralateral left common femoral vein patent. Multiple prominent lymph nodes in the right inguinal region may well have reactive etiology. These results were called by telephone at the time of interpretation on 06/30/2016 at 5:00 pm to Dr. Rudene Re , who verbally acknowledged these results. Electronically Signed   By: Lowella Grip III M.D.   On: 06/30/2016 17:01    Micro Results    No results found for this or any previous visit (from the past 240 hour(s)).     Today   Subjective:   Jaydien [place] today has no headache,no chest  abdominal pain,no new weakness tingling or numbness, feels much better wants to go home today.   Objective:   Blood pressure 130/75, pulse 68, temperature 98.5 F (36.9 C), temperature source Oral, resp. rate 18, height 6\' 3"  (1.905 m), weight 116.2 kg (256 lb 1.6 oz), SpO2 97 %.  No intake or output data in the 24 hours ending 07/06/16 1457  Exam Awake Alert, Oriented x 3, No new F.N deficits, Normal affect [place].AT,PERRAL Supple Neck,No JVD, No cervical lymphadenopathy appriciated.  Symmetrical Chest wall movement, Good air movement bilaterally, CTAB RRR,No Gallops,Rubs or new Murmurs, No  Parasternal Heave +ve B.Sounds, Abd Soft, Non tender, No organomegaly appriciated, No rebound -guarding or rigidity. No Cyanosis, Clubbing or edema, No new Rash or bruise  Data Review   CBC w Diff:  Lab Results  Component Value Date   WBC 84.5 (HH) 07/03/2016   HGB 13.6 07/03/2016   HGB 14.3 01/08/2014   HCT 41.2 07/03/2016   HCT 45.0 01/08/2014   PLT 220 07/03/2016   PLT 138 (L) 01/08/2014   LYMPHOPCT 90% 11/15/2014   LYMPHOPCT 91.0 01/08/2014   MONOPCT 1% 11/15/2014   MONOPCT 1.7 01/08/2014   EOSPCT 1% 11/15/2014   EOSPCT 0.6 01/08/2014   BASOPCT 1% 11/15/2014   BASOPCT 0.2 01/08/2014    CMP:  Lab Results  Component Value Date   NA 138 07/02/2016   NA 141 12/23/2013   K 4.3 07/02/2016   K 4.9 12/23/2013   CL 108 07/02/2016   CL 108 (H) 12/23/2013   CO2 23 07/02/2016   CO2 28 12/23/2013   BUN 13 07/02/2016   BUN 9 12/23/2013   CREATININE 0.86 07/02/2016   CREATININE 1.17 12/23/2013   PROT 6.6 03/28/2015   PROT 7.2 12/23/2013   ALBUMIN 4.0 03/28/2015   ALBUMIN 3.8 12/23/2013   BILITOT 0.5 03/28/2015   BILITOT 0.8 12/23/2013   ALKPHOS 60 03/28/2015   ALKPHOS 69 12/23/2013   AST 28 03/28/2015   AST 13 (L) 12/23/2013   ALT 28 03/28/2015   ALT 20 12/23/2013  .   Total Time in preparing paper work, data evaluation and todays exam - 62 minutes  Shuntae Herzig M.D on 07/03/2016 at 2:57 PM    Note: This dictation was prepared with Dragon dictation along with smaller phrase technology. Any transcriptional errors that result from this process are unintentional.

## 2016-07-07 DIAGNOSIS — C9110 Chronic lymphocytic leukemia of B-cell type not having achieved remission: Secondary | ICD-10-CM | POA: Insufficient documentation

## 2016-07-07 NOTE — Progress Notes (Signed)
[place]  Telephone:([phone] [phone]  ID: Anthony Hart. OB: 03-13-1956  MR#: [id number]  IOX#:[id number]  Patient Care Team: [name], MD as PCP - General (Family Medicine)  CHIEF COMPLAINT: CLL, Rai stage 0, right femoral DVT.  INTERVAL HISTORY: Patient last evaluated in clinic in August 2016. He is recently admitted to the hospital with extensive right for moral DVT requiring thrombectomy. He was subsequently placed on Eliqius. He currently feels well and is asymptomatic. He has no neurologic complaints. He denies any fevers or night sweats. He has a good appetite and denies weight loss. He denies any chest pain or shortness of breath. He denies any nausea, vomiting, constipation, or diarrhea. He has no urinary complaints. Patient offers no specific complaints today.  REVIEW OF SYSTEMS:   Review of Systems  Constitutional: Negative.  Negative for fever, malaise/fatigue and weight loss.  Respiratory: Negative.  Negative for cough and shortness of breath.   Cardiovascular: Negative.  Negative for chest pain and leg swelling.  Gastrointestinal: Negative.  Negative for abdominal pain.  Genitourinary: Negative.   Musculoskeletal: Negative.   Neurological: Negative.  Negative for sensory change and weakness.  Psychiatric/Behavioral: Negative.  The patient is not nervous/anxious.     As per HPI. Otherwise, a complete review of systems is negative.  PAST MEDICAL HISTORY: Past Medical History:  Diagnosis Date  . Cancer ([place])    CLL monitored   . Clotting disorder ([place])   . Diabetes mellitus without complication ([place])    Type II    PAST SURGICAL HISTORY: Past Surgical History:  Procedure Laterality Date  . APPENDECTOMY    . COLONOSCOPY WITH PROPOFOL  02/04/2015   Procedure: COLONOSCOPY WITH PROPOFOL;  Surgeon: [name], MD;  Location: [facility] ENDOSCOPY;  Service: Gastroenterology;;  . ESOPHAGOGASTRODUODENOSCOPY (EGD) WITH PROPOFOL  N/A 02/04/2015   Procedure: ESOPHAGOGASTRODUODENOSCOPY (EGD) WITH PROPOFOL;  Surgeon: [name], MD;  Location: [facility] ENDOSCOPY;  Service: Gastroenterology;  Laterality: N/A;  . PERIPHERAL VASCULAR THROMBECTOMY Right 07/02/2016   Procedure: Peripheral Vascular Thrombectomy and IVC filter;  Surgeon: [name], MD;  Location: [place] CV LAB;  Service: Cardiovascular;  Laterality: Right;    FAMILY HISTORY Family History  Problem Relation Age of Onset  . Diabetes Mother   . Cancer Brother   . Cancer Maternal Aunt   . Stroke Maternal Uncle        ADVANCED DIRECTIVES:    HEALTH MAINTENANCE: Social History  Substance Use Topics  . Smoking status: Never Smoker  . Smokeless tobacco: Never Used  . Alcohol use No     Colonoscopy:  PAP:  Bone density:  Lipid panel:  No Known Allergies  Current Outpatient Prescriptions  Medication Sig Dispense Refill  . apixaban (ELIQUIS) 5 MG TABS tablet Take 2 tablets (10 mg total) by mouth 2 (two) times daily. 10 mg po BID for 7 days followed by 5 mg po BID after that. (Patient taking differently: Take 5 mg by mouth 2 (two) times daily. 10 mg po BID for 7 days followed by 5 mg po BID after that.) 60 tablet 1  . metFORMIN (GLUCOPHAGE-XR) 750 MG 24 hr tablet Take 750 mg by mouth daily.    . cyclobenzaprine (FLEXERIL) 10 MG tablet Take 10 mg by mouth 3 (three) times daily as needed for muscle spasms.    . meloxicam (MOBIC) 15 MG tablet Take 15 mg by mouth daily.    . naproxen sodium (ANAPROX) 220 MG tablet  Take 220 mg by mouth 2 (two) times daily with a meal.    . traMADol (ULTRAM) 50 MG tablet Take 1 tablet (50 mg total) by mouth every 6 (six) hours as needed for moderate pain. (Patient not taking: Reported on 06/30/2016) 12 tablet 0   No current facility-administered medications for this visit.     OBJECTIVE: Vitals:   07/10/16 1202  BP: 104/67  Pulse: 77  Resp: 18  Temp: 97.3 F (36.3 C)     Body mass index is 32.78 kg/m.    ECOG  FS:0 - Asymptomatic  General: Well-developed, well-nourished, no acute distress. Eyes: Pink conjunctiva, anicteric sclera. Lungs: Clear to auscultation bilaterally. Heart: Regular rate and rhythm. No rubs, murmurs, or gallops. Abdomen: Soft, nontender, nondistended. No organomegaly noted, normoactive bowel sounds. Musculoskeletal: No edema, cyanosis, or clubbing. Neuro: Alert, answering all questions appropriately. Cranial nerves grossly intact. Skin: No rashes or petechiae noted. Psych: Normal affect. Lymphatics: No cervical, calvicular, axillary or inguinal LAD.   LAB RESULTS:  Lab Results  Component Value Date   NA 138 07/02/2016   K 4.3 07/02/2016   CL 108 07/02/2016   CO2 23 07/02/2016   GLUCOSE 172 (H) 07/02/2016   BUN 13 07/02/2016   CREATININE 0.86 07/02/2016   CALCIUM 8.2 (L) 07/02/2016   PROT 6.6 03/28/2015   ALBUMIN 4.0 03/28/2015   AST 28 03/28/2015   ALT 28 03/28/2015   ALKPHOS 60 03/28/2015   BILITOT 0.5 03/28/2015   GFRNONAA >60 07/02/2016   GFRAA >60 07/02/2016    Lab Results  Component Value Date   WBC 84.5 (HH) 07/03/2016   NEUTROABS 4.4 11/15/2014   HGB 13.6 07/03/2016   HCT 41.2 07/03/2016   MCV 89.2 07/03/2016   PLT 220 07/03/2016     STUDIES: Dg Chest 2 View  Result Date: 06/30/2016 CLINICAL DATA:  Pt sent over via PCP with possible DVT to right leg. Pt reports pain, swelling to RLE x 6 days, pt reports intermittent shortness of breath on exertion. 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 Never a smoker EXAM: CHEST  2 VIEW COMPARISON:  03/28/2015 FINDINGS: Normal mediastinum and cardiac silhouette. Normal pulmonary vasculature. No evidence of effusion, infiltrate, or pneumothorax. No acute bony abnormality. IMPRESSION: Normal chest radiograph. Electronically Signed   By: Suzy Bouchard M.D.   On: 06/30/2016 16:17   Ct Angio Chest Pe W Or Wo Contrast  Result Date: 07/01/2016 CLINICAL DATA:  Shortness of breath. Acute deep  venous thrombosis right lower extremity EXAM: CT ANGIOGRAPHY CHEST WITH CONTRAST TECHNIQUE: Multidetector CT imaging of the chest was performed using the standard protocol during bolus administration of intravenous contrast. Multiplanar CT image reconstructions and MIPs were obtained to evaluate the vascular anatomy. CONTRAST:  75 mL Isovue 370 nonionic COMPARISON:  Chest radiograph June 30, 2016 FINDINGS: Cardiovascular: There are lower lobe pulmonary emboli involving multiple bilateral lower lobe pulmonary arterial segments. Pulmonary embolus arises from the distal most aspect of the left main pulmonary artery. The right ventricle to left ventricle diameter ratio is less than 0.9, not consistent with right heart strain. There is no thoracic aortic aneurysm or dissection. There are scattered foci of coronary artery calcification. Visualized great vessels appear unremarkable. Pericardium is not thickened. Mediastinum/Nodes: Thyroid appears normal. There is a mildly prominent sub- carinal lymph node measuring 1.5 x 1.5 cm. No other lymph node enlargement is evident. Lungs/Pleura: There is either scarring or atelectasis in the extreme lung apex on the left.  There is patchy airspace consolidation in portions of the posterior and lateral segments the left lower lobe. There is bibasilar atelectasis as well. There is no appreciable pleural effusion or pleural thickening. No well-defined infarct evident. Upper Abdomen: Spleen is enlarged although incompletely visualized. Visualized upper abdominal structures otherwise appear unremarkable. Musculoskeletal: There are no blastic or lytic bone lesions. No evident chest wall lesions. Review of the MIP images confirms the above findings. IMPRESSION: Multiple lower lobe pulmonary emboli bilaterally. Pulmonary embolus arises from the distal most aspect the left main pulmonary artery. No right heart strain. Patchy infiltrate posterior and lateral segments left lower lobe  peripherally, most consistent with pneumonia. Areas of atelectatic change in both lower lobes as well as in the extreme left apex. Single prominent lymph node in the sub- carinal region of uncertain etiology. No other lymph node enlargement. Incomplete visualization of splenic enlargement. Etiology for splenomegaly uncertain. Critical Value/emergent results were called by telephone at the time of interpretation on 07/01/2016 at 12:48 pm to Dr. Gladstone Lighter , who verbally acknowledged these results. Electronically Signed   By: Lowella Grip III M.D.   On: 07/01/2016 12:48   US Venous Img Lower Unilateral Right  Result Date: 06/30/2016 CLINICAL DATA:  Lower extremity edema for 6 days EXAM: RIGHT LOWER EXTREMITY VENOUS DUPLEX ULTRASOUND TECHNIQUE: Gray-scale sonography with graded compression, as well as color Doppler and duplex ultrasound were performed to evaluate the right lower extremity deep venous system from the level of the common femoral vein and including the common femoral, femoral, profunda femoral, popliteal and calf veins including the posterior tibial, peroneal and gastrocnemius veins when visible. The superficial great saphenous vein was also interrogated. Spectral Doppler was utilized to evaluate flow at rest and with distal augmentation maneuvers in the common femoral, femoral and popliteal veins. COMPARISON:  None. FINDINGS: Contralateral Common Femoral Vein: Respiratory phasicity is normal and symmetric with the symptomatic side. No evidence of thrombus. Normal compressibility. Common Femoral Vein: No evidence of thrombus. Normal compressibility, respiratory phasicity and response to augmentation. Saphenofemoral Junction: No evidence of thrombus. Normal compressibility and flow on color Doppler imaging. Profunda Femoral Vein: No evidence of thrombus. Normal compressibility and flow on color Doppler imaging. Femoral Vein: There is acute appearing thrombus throughout much of the mid the  distal aspect of the right femoral vein. There is loss of Doppler signal in the areas of acute thrombus. There is loss of compression and augmentation. Popliteal Vein: There is acute thrombus throughout this vessel with loss of Doppler signal. No compression or augmentation. Calf Veins: Acute thrombus is seen throughout the visualized calf veins. There is loss of Doppler signal. No evident compression or augmentation. Superficial Great Saphenous Vein: No evidence of thrombus. Normal compressibility and flow on color Doppler imaging. Venous Reflux:  None. Other Findings: Several prominent lymph nodes are noted in the right inguinal region with central fatty hila. IMPRESSION: Extensive acute deep venous thrombosis throughout much of the right femoral vein as well as throughout the entire right popliteal vein and visualized right calf vein regions. Contralateral left common femoral vein patent. Multiple prominent lymph nodes in the right inguinal region may well have reactive etiology. These results were called by telephone at the time of interpretation on 06/30/2016 at 5:00 pm to Dr. Rudene Re , who verbally acknowledged these results. Electronically Signed   By: Lowella Grip III M.D.   On: 06/30/2016 17:01    ASSESSMENT: CLL, Rai stage 0, right femoral DVT  PLAN:  1. CLL: Patient's most recent white blood cell count was reported at 84.5, which is slightly above his baseline. Previously, peripheral blood flow cytometry confirmed the diagnosis.  Patient was noted to have a CD7 aberrant T cell antigen which occasionally can be considered poor prognosis. CT scan results from October 2015 revealed only mild lymphadenopathy. This only needs to be repeated if there is concern of progression of disease and treatment is necessary.  Patient does not require treatment at this time. He does not require bone marrow biopsy. Return to clinic in 6 months with repeat laboratory work and further evaluation.   2.  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 Despite this, will do a full hypercoagulable workup for completeness. He has been instructed to continue Eliquis for a minimum of 6 months. Return to clinic at that time for further evaluation and to discuss whether or not extended anticoagulation is necessary.  Approximately 30 minutes was spent in discussion of which greater than 50% was consultation.  Patient expressed understanding and was in agreement with this plan. He also understands that He can call clinic at any time with any questions, concerns, or complaints.     [name], MD   07/10/2016 1:14 PM

## 2016-07-10 ENCOUNTER — Inpatient Hospital Stay: Payer: Medicaid Other

## 2016-07-10 ENCOUNTER — Encounter: Payer: Self-pay | Admitting: Oncology

## 2016-07-10 ENCOUNTER — Inpatient Hospital Stay: Payer: Medicaid Other | Attending: Oncology | Admitting: Oncology

## 2016-07-10 VITALS — BP 104/67 | HR 77 | Temp 97.3°F | Resp 18 | Wt 262.2 lb

## 2016-07-10 DIAGNOSIS — Z7901 Long term (current) use of anticoagulants: Secondary | ICD-10-CM | POA: Insufficient documentation

## 2016-07-10 DIAGNOSIS — Z7984 Long term (current) use of oral hypoglycemic drugs: Secondary | ICD-10-CM | POA: Insufficient documentation

## 2016-07-10 DIAGNOSIS — Z809 Family history of malignant neoplasm, unspecified: Secondary | ICD-10-CM | POA: Diagnosis not present

## 2016-07-10 DIAGNOSIS — I82411 Acute embolism and thrombosis of right femoral vein: Secondary | ICD-10-CM | POA: Diagnosis not present

## 2016-07-10 DIAGNOSIS — C9110 Chronic lymphocytic leukemia of B-cell type not having achieved remission: Secondary | ICD-10-CM | POA: Diagnosis present

## 2016-07-10 DIAGNOSIS — E119 Type 2 diabetes mellitus without complications: Secondary | ICD-10-CM | POA: Insufficient documentation

## 2016-07-10 DIAGNOSIS — I2699 Other pulmonary embolism without acute cor pulmonale: Secondary | ICD-10-CM | POA: Diagnosis not present

## 2016-07-10 DIAGNOSIS — Z79899 Other long term (current) drug therapy: Secondary | ICD-10-CM | POA: Diagnosis not present

## 2016-07-10 DIAGNOSIS — R161 Splenomegaly, not elsewhere classified: Secondary | ICD-10-CM | POA: Insufficient documentation

## 2016-07-10 LAB — ANTITHROMBIN III: ANTITHROMB III FUNC: 84 % (ref 75–120)

## 2016-07-11 LAB — CARDIOLIPIN ANTIBODIES, IGG, IGM, IGA
Anticardiolipin IgA: <9 U/mL (ref 0–11)
Anticardiolipin IgG: <9 GPL U/mL (ref 0–14)
Anticardiolipin IgM: <9 [MPL'U]/mL (ref 0–12)

## 2016-07-11 LAB — PROTEIN S ACTIVITY: Protein S Activity: 114 % (ref 63–140)

## 2016-07-11 LAB — PROTEIN C ACTIVITY: Protein C Activity: 129 % (ref 73–180)

## 2016-07-11 LAB — PROTEIN S, TOTAL: Protein S Ag, Total: 126 % (ref 60–150)

## 2016-07-12 LAB — LUPUS ANTICOAGULANT PANEL
DRVVT: 81 s — ABNORMAL HIGH (ref 0.0–47.0)
PTT LA: 37.7 s (ref 0.0–51.9)

## 2016-07-12 LAB — BETA-2-GLYCOPROTEIN I ABS, IGG/M/A
Beta-2 Glyco I IgG: <9 GPI IgG units (ref 0–20)
Beta-2-Glycoprotein I IgM: <9 GPI IgM units (ref 0–32)

## 2016-07-12 LAB — PROTEIN C, TOTAL: Protein C, Total: 98 % (ref 60–150)

## 2016-07-12 LAB — DRVVT CONFIRM: dRVVT Confirm: 1.4 ratio — ABNORMAL HIGH (ref 0.8–1.2)

## 2016-07-12 LAB — DRVVT MIX: DRVVT MIX: 52.2 s — AB (ref 0.0–47.0)

## 2016-07-13 LAB — HOMOCYSTEINE: Homocysteine: 14.2 umol/L (ref 0.0–15.0)

## 2016-07-14 LAB — FACTOR 5 LEIDEN

## 2016-07-15 ENCOUNTER — Inpatient Hospital Stay: Payer: Medicaid Other | Admitting: Oncology

## 2016-07-21 ENCOUNTER — Encounter (INDEPENDENT_AMBULATORY_CARE_PROVIDER_SITE_OTHER): Payer: Medicaid Other

## 2016-07-24 LAB — PROTHROMBIN GENE MUTATION

## 2016-09-08 ENCOUNTER — Encounter (INDEPENDENT_AMBULATORY_CARE_PROVIDER_SITE_OTHER): Payer: Medicaid Other

## 2016-09-08 ENCOUNTER — Encounter (INDEPENDENT_AMBULATORY_CARE_PROVIDER_SITE_OTHER): Payer: Medicaid Other | Admitting: Vascular Surgery

## 2016-10-28 ENCOUNTER — Emergency Department: Payer: Medicaid Other

## 2016-10-28 ENCOUNTER — Inpatient Hospital Stay
Admission: EM | Admit: 2016-10-28 | Discharge: 2016-11-04 | DRG: 271 | Disposition: A | Discharge status: Home / Self Care | Payer: Medicaid Other | Attending: Internal Medicine | Admitting: Internal Medicine

## 2016-10-28 DIAGNOSIS — I82409 Acute embolism and thrombosis of unspecified deep veins of unspecified lower extremity: Secondary | ICD-10-CM | POA: Diagnosis not present

## 2016-10-28 DIAGNOSIS — I82411 Acute embolism and thrombosis of right femoral vein: Secondary | ICD-10-CM | POA: Diagnosis present

## 2016-10-28 DIAGNOSIS — I82403 Acute embolism and thrombosis of unspecified deep veins of lower extremity, bilateral: Secondary | ICD-10-CM

## 2016-10-28 DIAGNOSIS — Z8249 Family history of ischemic heart disease and other diseases of the circulatory system: Secondary | ICD-10-CM | POA: Diagnosis not present

## 2016-10-28 DIAGNOSIS — R9431 Abnormal electrocardiogram [ECG] [EKG]: Secondary | ICD-10-CM | POA: Diagnosis present

## 2016-10-28 DIAGNOSIS — M79605 Pain in left leg: Secondary | ICD-10-CM

## 2016-10-28 DIAGNOSIS — E1165 Type 2 diabetes mellitus with hyperglycemia: Secondary | ICD-10-CM | POA: Diagnosis present

## 2016-10-28 DIAGNOSIS — D6859 Other primary thrombophilia: Secondary | ICD-10-CM | POA: Diagnosis present

## 2016-10-28 DIAGNOSIS — C9110 Chronic lymphocytic leukemia of B-cell type not having achieved remission: Secondary | ICD-10-CM | POA: Diagnosis present

## 2016-10-28 DIAGNOSIS — M79604 Pain in right leg: Secondary | ICD-10-CM

## 2016-10-28 DIAGNOSIS — R001 Bradycardia, unspecified: Secondary | ICD-10-CM | POA: Diagnosis present

## 2016-10-28 DIAGNOSIS — Z86718 Personal history of other venous thrombosis and embolism: Secondary | ICD-10-CM

## 2016-10-28 DIAGNOSIS — N179 Acute kidney failure, unspecified: Secondary | ICD-10-CM | POA: Diagnosis present

## 2016-10-28 DIAGNOSIS — Z7901 Long term (current) use of anticoagulants: Secondary | ICD-10-CM | POA: Diagnosis not present

## 2016-10-28 DIAGNOSIS — Z833 Family history of diabetes mellitus: Secondary | ICD-10-CM

## 2016-10-28 DIAGNOSIS — Z95828 Presence of other vascular implants and grafts: Secondary | ICD-10-CM

## 2016-10-28 DIAGNOSIS — R600 Localized edema: Secondary | ICD-10-CM | POA: Diagnosis not present

## 2016-10-28 DIAGNOSIS — D696 Thrombocytopenia, unspecified: Secondary | ICD-10-CM | POA: Diagnosis present

## 2016-10-28 DIAGNOSIS — I82431 Acute embolism and thrombosis of right popliteal vein: Secondary | ICD-10-CM | POA: Diagnosis present

## 2016-10-28 DIAGNOSIS — Z794 Long term (current) use of insulin: Secondary | ICD-10-CM | POA: Diagnosis not present

## 2016-10-28 DIAGNOSIS — Z7984 Long term (current) use of oral hypoglycemic drugs: Secondary | ICD-10-CM | POA: Diagnosis not present

## 2016-10-28 DIAGNOSIS — D72829 Elevated white blood cell count, unspecified: Secondary | ICD-10-CM | POA: Diagnosis not present

## 2016-10-28 DIAGNOSIS — E119 Type 2 diabetes mellitus without complications: Secondary | ICD-10-CM | POA: Diagnosis not present

## 2016-10-28 DIAGNOSIS — M7989 Other specified soft tissue disorders: Secondary | ICD-10-CM | POA: Diagnosis not present

## 2016-10-28 DIAGNOSIS — R739 Hyperglycemia, unspecified: Secondary | ICD-10-CM | POA: Diagnosis present

## 2016-10-28 DIAGNOSIS — Z823 Family history of stroke: Secondary | ICD-10-CM

## 2016-10-28 DIAGNOSIS — I824Y3 Acute embolism and thrombosis of unspecified deep veins of proximal lower extremity, bilateral: Secondary | ICD-10-CM

## 2016-10-28 DIAGNOSIS — Z79899 Other long term (current) drug therapy: Secondary | ICD-10-CM | POA: Diagnosis not present

## 2016-10-28 DIAGNOSIS — Z809 Family history of malignant neoplasm, unspecified: Secondary | ICD-10-CM | POA: Diagnosis not present

## 2016-10-28 LAB — BASIC METABOLIC PANEL
ANION GAP: 7 (ref 5–15)
BUN: 9 mg/dL (ref 6–20)
CALCIUM: 8.7 mg/dL — AB (ref 8.9–10.3)
CO2: 27 mmol/L (ref 22–32)
Chloride: 101 mmol/L (ref 101–111)
Creatinine, Ser: 1.25 mg/dL — ABNORMAL HIGH (ref 0.61–1.24)
GFR calc non Af Amer: >60 mL/min (ref >60)
Glucose, Bld: 450 mg/dL — ABNORMAL HIGH (ref 65–99)
Potassium: 4.8 mmol/L (ref 3.5–5.1)
SODIUM: 135 mmol/L (ref 135–145)

## 2016-10-28 LAB — CBC
HCT: 44.9 % (ref 40.0–52.0)
HEMOGLOBIN: 14.2 g/dL (ref 13.0–18.0)
MCH: 28.5 pg (ref 26.0–34.0)
MCHC: 31.6 g/dL — ABNORMAL LOW (ref 32.0–36.0)
MCV: 90.3 fL (ref 80.0–100.0)
PLATELETS: 118 10*3/uL — AB (ref 150–440)
RBC: 4.98 MIL/uL (ref 4.40–5.90)
RDW: 16.1 % — ABNORMAL HIGH (ref 11.5–14.5)
WBC: 88.1 10*3/uL (ref 3.8–10.6)

## 2016-10-28 LAB — PROTIME-INR
INR: 1.03
Prothrombin Time: 13.5 seconds (ref 11.4–15.2)

## 2016-10-28 LAB — APTT: aPTT: 26 seconds (ref 24–36)

## 2016-10-28 LAB — HEPARIN LEVEL (UNFRACTIONATED): Heparin Unfractionated: 0.59 IU/mL (ref 0.30–0.70)

## 2016-10-28 MED ORDER — HEPARIN (PORCINE) IN NACL 100-0.45 UNIT/ML-% IJ SOLN
1700.0000 [IU]/h | INTRAMUSCULAR | Status: DC
  Administered 2016-10-28: 1500 [IU]/h via INTRAVENOUS
  Filled 2016-10-28: qty 250

## 2016-10-28 MED ORDER — HEPARIN BOLUS VIA INFUSION
4000.0000 [IU] | Freq: Once | INTRAVENOUS | Status: AC
  Administered 2016-10-28: 4000 [IU] via INTRAVENOUS
  Filled 2016-10-28: qty 4000

## 2016-10-28 MED ORDER — HYDROCODONE-ACETAMINOPHEN 5-325 MG PO TABS
1.0000 | ORAL_TABLET | Freq: Once | ORAL | Status: AC
  Administered 2016-10-28: 1 via ORAL
  Filled 2016-10-28: qty 1

## 2016-10-28 NOTE — ED Triage Notes (Signed)
Patient thinks he might have a blood clot in bilateral legs. States both legs have been hurting for about 5 days. 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 he has hx of DVT in right leg 6 months ago. He states he is on Eliquis.

## 2016-10-28 NOTE — Progress Notes (Signed)
ANTICOAGULATION CONSULT NOTE - Initial Consult  Pharmacy Consult for heparin drip Indication: DVT  No Known Allergies  Patient Measurements: Height: 6\' 3"  (190.5 cm) Weight: 266 lb (120.7 kg) IBW/kg (Calculated) : 84.5 Heparin Dosing Weight: 110.1 kg  Vital Signs: Temp: 98.1 F (36.7 C) (07/18 1922) Temp Source: Oral (07/18 1922) BP: 110/67 (07/18 2300) Pulse Rate: 62 (07/18 2300)  Labs:  Recent Labs  10/28/16 1924  HGB 14.2  HCT 44.9  PLT 118*  CREATININE 1.25*    Estimated Creatinine Clearance: 90.2 mL/min (A) (by C-G formula based on SCr of 1.25 mg/dL (H)).   Medical History: Past Medical History:  Diagnosis Date  . Cancer ([place])    CLL monitored   . Clotting disorder ([place])   . 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 Patient takes apixaban 5mg  bid at home.  Goal of Therapy:  Heparin level 0.3-0.7 units/ml Monitor platelets by anticoagulation protocol: Yes   Plan:  Give 4000 units bolus x 1 Start heparin infusion at 1500 units/hr  Baseline labs ordered. Will order HL/aPTT @ 0500 since patient is on a DOAC If levels do not correlate will dose off of aPTT until both HL/aPTT correlated.  Tobie Lords, PharmD, BCPS Clinical Pharmacist 10/28/2016

## 2016-10-28 NOTE — ED Notes (Signed)
Admission MD at bedside.  

## 2016-10-28 NOTE — ED Notes (Signed)
Elevated WBC reported to Vladimir Crofts NP - charge nurse then notified and pt moved to main for work up Delilah Shan RN and Dr Mable Paris informed

## 2016-10-28 NOTE — ED Provider Notes (Signed)
[facility] Emergency Department Provider Note  ____________________________________________   First MD Initiated Contact with Patient 10/28/16 2004     (approximate)  I have reviewed the triage vital signs and the nursing notes.   HISTORY  Chief Complaint Leg Pain and Weakness    HPI Anthony Hart. is a 61 y.o. male who comes to the emergency department with several days of bilateral lower extremity swelling. 5 month ago he was diagnosed with a deep vein thrombosis after surgery and he has been taking Eliquis ever since. He reports compliance to medication. He denies chest pain or shortness of breath. His leg swelling has been gradual progressive. No erythema or warmth. He does have mild to moderate severity aching discomfort in his legs worse when walking improved with rest.   Past Medical History:  Diagnosis Date  . Cancer ([place])    CLL monitored   . Clotting disorder ([place])   . Diabetes mellitus without complication ([place])    Type II    Patient Active Problem List   Diagnosis Date Noted  . Abnormal EKG 10/29/2016  . Hyperglycemia 10/29/2016  . Leg DVT (deep venous thromboembolism), acute, bilateral ([place]) 10/28/2016  . CLL (chronic lymphocytic leukemia) ([place]) 07/07/2016  . DVT (deep venous thrombosis) ([place]) 06/30/2016    Past Surgical History:  Procedure Laterality Date  . APPENDECTOMY    . COLONOSCOPY WITH PROPOFOL  02/04/2015   Procedure: COLONOSCOPY WITH PROPOFOL;  Surgeon: [name], MD;  Location: [facility] ENDOSCOPY;  Service: Gastroenterology;;  . ESOPHAGOGASTRODUODENOSCOPY (EGD) WITH PROPOFOL N/A 02/04/2015   Procedure: ESOPHAGOGASTRODUODENOSCOPY (EGD) WITH PROPOFOL;  Surgeon: [name], MD;  Location[facility] END[facility]COPY;  Service: Gastroenterology;  Laterality: N/A;  . PERIPHERAL VASCULAR THROMBECTOMY Right 07/02/2016   Procedure: Peripheral Vascular Thrombectomy and IVC filter;  Surgeon: [name], MD;  Location: [place] CV LAB;   Service: Cardiovascular;  Laterality: Right;    Prior to Admission medications   Medication Sig Start Date End Date Taking? Authorizing Provider  apixaban (ELIQUIS) 5 MG TABS tablet Take 5 mg by mouth 2 (two) times daily.   Yes [provider]  metFORMIN (GLUCOPHAGE-XR) 750 MG 24 hr tablet Take 750 mg by mouth daily. 03/09/16 03/09/17 Yes [provider]    Allergies Patient has no known allergies.  Family History  Problem Relation Age of Onset  . Diabetes Mother   . Cancer Brother   . Cancer Maternal Aunt   . Stroke Maternal Uncle     Social History Social History  Substance Use Topics  . Smoking status: Never Smoker  . Smokeless tobacco: Never Used  . Alcohol use No    Review of Systems Constitutional: No fever/chills Eyes: No visual changes. ENT: No sore throat. Cardiovascular: Denies chest pain. Respiratory: Denies shortness of breath. Gastrointestinal: No abdominal pain.  No nausea, no vomiting.  No diarrhea.  No constipation. Genitourinary: Negative for dysuria. Musculoskeletal: Negative for back pain. Skin: Negative for rash. Neurological: Negative for headaches, focal weakness or numbness.   ____________________________________________   PHYSICAL EXAM:  VITAL SIGNS: ED Triage Vitals  Enc Vitals Group     BP --      Pulse --      Resp 10/28/16 1922 19     Temp 10/28/16 1922 98.1 F (36.7 C)     Temp Source 10/28/16 1922 Oral     SpO2 10/28/16 1922 97 %     Weight 10/28/16 1922 266 lb (120.7 kg)  Height 10/28/16 1922 6\' 3"  (1.905 m)     Head Circumference --      Peak Flow --      Pain Score 10/28/16 1920 8     Pain Loc --      Pain Edu? --      Excl. in [place]? --     Constitutional: Alert and oriented 4 well appearing nontoxic no diaphoresis Eyes: PERRL EOMI. Head: Atraumatic. Nose: No congestion/rhinnorhea. Mouth/Throat: No trismus Neck: No stridor.   Cardiovascular: Normal rate, regular rhythm. Grossly normal heart  sounds.  Good peripheral circulation. Respiratory: Normal respiratory effort.  No retractions. Lungs CTAB and moving good air Gastrointestinal: Soft nontender Musculoskeletal: Legs are equal in size but both have mild swelling. 2+ dorsalis pedis pulse bilaterally Neurologic:  Normal speech and language. No gross focal neurologic deficits are appreciated. Skin:  Skin is warm, dry and intact. No rash noted. Psychiatric: Mood and affect are normal. Speech and behavior are normal.    ____________________________________________   DIFFERENTIAL includes but not limited to  DVT, Baker's cyst, cellulitis, venous insufficiency, claudication ____________________________________________   LABS (all labs ordered are listed, but only abnormal results are displayed)  Labs Reviewed  BASIC METABOLIC PANEL - Abnormal; Notable for the following:       Result Value   Glucose, Bld 450 (*)    Creatinine, Ser 1.25 (*)    Calcium 8.7 (*)    All other components within normal limits  CBC - Abnormal; Notable for the following:    WBC 88.1 (*)    MCHC 31.6 (*)    RDW 16.1 (*)    Platelets 118 (*)    All other components within normal limits  APTT - Abnormal; Notable for the following:    aPTT 43 (*)    All other components within normal limits  BASIC METABOLIC PANEL - Abnormal; Notable for the following:    Glucose, Bld 365 (*)    Calcium 8.5 (*)    All other components within normal limits  CBC - Abnormal; Notable for the following:    WBC 91.5 (*)    RDW 15.9 (*)    Platelets 112 (*)    All other components within normal limits  GLUCOSE, CAPILLARY - Abnormal; Notable for the following:    Glucose-Capillary 377 (*)    All other components within normal limits  GLUCOSE, CAPILLARY - Abnormal; Notable for the following:    Glucose-Capillary 308 (*)    All other components within normal limits  APTT - Abnormal; Notable for the following:    aPTT 51 (*)    All other components within normal  limits  GLUCOSE, CAPILLARY - Abnormal; Notable for the following:    Glucose-Capillary 216 (*)    All other components within normal limits  APTT - Abnormal; Notable for the following:    aPTT 60 (*)    All other components within normal limits  GLUCOSE, CAPILLARY - Abnormal; Notable for the following:    Glucose-Capillary 198 (*)    All other components within normal limits  GLUCOSE, CAPILLARY - Abnormal; Notable for the following:    Glucose-Capillary 302 (*)    All other components within normal limits  HEPARIN LEVEL (UNFRACTIONATED)  HEPARIN LEVEL (UNFRACTIONATED)  PROTIME-INR  APTT  TROPONIN I  PROTIME-INR  HEPARIN LEVEL (UNFRACTIONATED)  HEPARIN LEVEL (UNFRACTIONATED)  HEMOGLOBIN A1C  CBC  BASIC METABOLIC PANEL  APTT    Labs are unremarkable __________________________________________  EKG   ____________________________________________  RADIOLOGY  Ultrasound shows bilateral lower extremity DVTs ____________________________________________   PROCEDURES  Procedure(s) performed: no  Procedures  Critical Care performed: no  Observation: no ____________________________________________   INITIAL IMPRESSION / ASSESSMENT AND PLAN / ED COURSE  Pertinent labs & imaging results that were available during my care of the patient were reviewed by me and considered in my medical decision making (see chart for details).  The patient arrives with Bilateral lower extremity swelling with a history of DVT that was thought to be provoked. He reports compliance with his request however I'm concerned that he may have a failure. Ultrasound pending.  ----------------------------------------- 10:12 PM on 10/28/2016 -----------------------------------------  I discussed the case with on-call hematologist Dr. Rogue Bussing who recommends IV heparin infusion in the setting of eliquis failure and inpatient admission. He'll kindly consult on the  patient. ____________________________________________   FINAL CLINICAL IMPRESSION(S) / ED DIAGNOSES  Final diagnoses:  Lower extremity pain, bilateral  Acute deep vein thrombosis (DVT) of proximal vein of both lower extremities ([place])      NEW MEDICATIONS STARTED DURING THIS VISIT:  Current Discharge Medication List       Note:  This document was prepared using Dragon voice recognition software and may include unintentional dictation errors.     Darel Hong, MD 10/30/16 [phone]

## 2016-10-28 NOTE — ED Notes (Addendum)
Checked with Dr. Quentin Cornwall for verbal orders said to send to flex since patient is already on eliquis and to just get blood work and stated did not need ultrasound.

## 2016-10-28 NOTE — H&P (Addendum)
History and Physical   [facility] - [place] @ [facility] Admission History and Physical [employer], D.O.    Patient Name: Anthony Hart MR#: [id number] Date of Birth: 07/17/1955 Date of Admission: 10/28/2016  Referring MD/NP/PA: Dr. Mable Paris Primary Care Physician: [name], MD Patient coming from: Home Outpatient Specialists: Dr. [name]   Chief Complaint:  Chief Complaint  Patient presents with  . Leg Pain  . Weakness    HPI: Anthony Hart is a 61 y.o. male with a known history of CLL which was being monitored but was never treated, diabetes, recent (March '18) right femoral DVT status post thrombectomy and on Eliquis presents to the emergency department for evaluation of bilateral leg pain.  Patient was in a usual state of health until about 5 days ago when he describes the onset of bilateral lower extremity pain, weakness and swelling. Patient states that he has been compliant with his Eliquis.  Patient denies fevers/chills, weakness, dizziness, chest pain, shortness of breath, N/V/C/D, abdominal pain, dysuria/frequency, changes in mental status.    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 Patient has been taking medication as prescribed and there has been no recent change in medication or diet.  No recent antibiotics.  There has been no recent illness, hospitalizations, travel or sick contacts.    EMS/ED Course: Patient received  heparin, Norco. Medical admission was requested for IV heparin drip and heme all consult given risk factors of prolonged immobility, history of CLL and acute DVT despite Eliquis therapy.  Review of Systems:  CONSTITUTIONAL: No fever/chills, fatigue, weakness, weight gain/loss, headache. EYES: No blurry or double vision. ENT: No tinnitus, postnasal drip, redness or soreness of the oropharynx. RESPIRATORY: No cough, dyspnea, wheeze.  No hemoptysis.   CARDIOVASCULAR: No chest pain, palpitations, syncope, orthopnea. No lower extremity edema.  GASTROINTESTINAL: No nausea, vomiting, abdominal pain, diarrhea, constipation.  No hematemesis, melena or hematochezia. GENITOURINARY: No dysuria, frequency, hematuria. ENDOCRINE: No polyuria or nocturia. No heat or cold intolerance. HEMATOLOGY: No anemia, bruising, bleeding. INTEGUMENTARY: No rashes, ulcers, lesions. MUSCULOSKELETAL:  Positive bilateral lower extremity pain, swelling and weakness as described in history of present illness.No arthritis, gout, dyspnea. NEUROLOGIC: No numbness, tingling, ataxia, seizure-type activity, weakness. PSYCHIATRIC: No anxiety, depression, insomnia.   Past Medical History:  Diagnosis Date  . Cancer ([place])    CLL monitored   . Clotting disorder ([place])   . Diabetes mellitus without complication ([place])    Type II    Past Surgical History:  Procedure Laterality Date  . APPENDECTOMY    . COLONOSCOPY WITH PROPOFOL  02/04/2015   Procedure: COLONOSCOPY WITH PROPOFOL;  Surgeon: [name], MD;  Location: [facility] ENDOSCOPY;  Service: Gastroenterology;;  . ESOPHAGOGASTRODUODENOSCOPY (EGD) WITH PROPOFOL N/A 02/04/2015   Procedure: ESOPHAGOGASTRODUODENOSCOPY (EGD) WITH PROPOFOL;  Surgeon: [name], MD;  Location: [facility] ENDOSCOPY;  Service: Gastroenterology;  Laterality: N/A;  . PERIPHERAL VASCULAR THROMBECTOMY Right 07/02/2016   Procedure: Peripheral Vascular Thrombectomy and IVC filter;  Surgeon: [name], MD;  Location: [facility] LAB;  Service: Cardiovascular;  Laterality: Right;     reports that he has never smoked. He has never used smokeless tobacco. He reports that he does not drink alcohol or use drugs.  No Known Allergies  Family History  Problem Relation Age of Onset  . Diabetes Mother   . Cancer Brother   . Cancer Maternal Aunt   . Stroke Maternal Uncle  Prior to Admission medications   Medication Sig Start Date End Date Taking?  Authorizing Provider  apixaban (ELIQUIS) 5 MG TABS tablet Take 5 mg by mouth 2 (two) times daily.   Yes [provider]  metFORMIN (GLUCOPHAGE-XR) 750 MG 24 hr tablet Take 750 mg by mouth daily. 03/09/16 03/09/17 Yes [provider]    Physical Exam: Vitals:   10/28/16 2150 10/28/16 2200 10/28/16 2230 10/28/16 2300  BP: 115/85 115/87 110/79 110/67  Pulse: 69 68 (!) 58 62  Resp: 16 14 15 11   Temp:      TempSrc:      SpO2: 98% 99% 97% 97%  Weight:      Height:        GENERAL: 61 y.o.-year-old Male patient, well-developed, well-nourished lying in the bed in no acute distress.  Pleasant and cooperative.   HEENT: Head atraumatic, normocephalic. Pupils equal, round, reactive to light and accommodation. No scleral icterus. Extraocular muscles intact. Nares are patent. Oropharynx is clear. Mucus membranes moist. NECK: Supple, full range of motion. No JVD, no bruit heard. No thyroid enlargement, no tenderness, no cervical lymphadenopathy. CHEST: Normal breath sounds bilaterally. No wheezing, rales, rhonchi or crackles. No use of accessory muscles of respiration.  No reproducible chest wall tenderness.  CARDIOVASCULAR: S1, S2 normal. No murmurs, rubs, or gallops. Cap refill <2 seconds. Pulses intact distally.  ABDOMEN: Soft, nondistended, nontender. No rebound, guarding, rigidity. Normoactive bowel sounds present in all four quadrants. No organomegaly or mass. EXTREMITIES:  Mild bilateral lower extremity edema, nonpitting. Positive calf tenderness bilaterally. No Homan's sign.  NEUROLOGIC: The patient is alert and oriented x 3. Cranial nerves II through XII are grossly intact with no focal sensorimotor deficit. Muscle strength 5/5 in all extremities. Sensation intact. Gait not checked. PSYCHIATRIC:  Normal affect, mood, thought content. SKIN: Warm, dry, and intact without obvious rash, lesion, or ulcer.    Labs on Admission:  CBC:  Recent Labs Lab 10/28/16 1924  WBC 88.1*   HGB 14.2  HCT 44.9  MCV 90.3  PLT 700*   Basic Metabolic Panel:  Recent Labs Lab 10/28/16 1924  NA 135  K 4.8  CL 101  CO2 27  GLUCOSE 450*  BUN 9  CREATININE 1.25*  CALCIUM 8.7*   GFR: Estimated Creatinine Clearance: 90.2 mL/min (A) (by C-G formula based on SCr of 1.25 mg/dL (H)). Liver Function Tests: No results for input(s): AST, ALT, ALKPHOS, BILITOT, PROT, ALBUMIN in the last 168 hours. No results for input(s): LIPASE, AMYLASE in the last 168 hours. No results for input(s): AMMONIA in the last 168 hours. Coagulation Profile: No results for input(s): INR, PROTIME in the last 168 hours. Cardiac Enzymes: No results for input(s): CKTOTAL, CKMB, CKMBINDEX, TROPONINI in the last 168 hours. BNP (last 3 results) No results for input(s): PROBNP in the last 8760 hours. HbA1C: No results for input(s): HGBA1C in the last 72 hours. CBG: No results for input(s): GLUCAP in the last 168 hours. Lipid Profile: No results for input(s): CHOL, HDL, LDLCALC, TRIG, CHOLHDL, LDLDIRECT in the last 72 hours. Thyroid Function Tests: No results for input(s): TSH, T4TOTAL, FREET4, T3FREE, THYROIDAB in the last 72 hours. Anemia Panel: No results for input(s): VITAMINB12, FOLATE, FERRITIN, TIBC, IRON, RETICCTPCT in the last 72 hours. Urine analysis:    Component Value Date/Time   COLORURINE Yellow 12/23/2013 1350   APPEARANCEUR Clear 12/23/2013 1350   LABSPEC 1.020 12/23/2013 1350   PHURINE 5.0 12/23/2013 1350   GLUCOSEU Negative 12/23/2013 1350  HGBUR 1+ 12/23/2013 1350   BILIRUBINUR Negative 12/23/2013 1350   KETONESUR Negative 12/23/2013 1350   PROTEINUR Negative 12/23/2013 1350   NITRITE Negative 12/23/2013 1350   LEUKOCYTESUR Negative 12/23/2013 1350   Sepsis Labs: @LABRCNTIP (procalcitonin:4,lacticidven:4) )No results found for this or any previous visit (from the past 240 hour(s)).   Radiological Exams on Admission: US Venous Img Lower Bilateral  Result Date:  10/28/2016 CLINICAL DATA:  Initial evaluation for acute bilateral lower extremity pain. EXAM: BILATERAL LOWER EXTREMITY VENOUS DOPPLER ULTRASOUND TECHNIQUE: Gray-scale sonography with graded compression, as well as color Doppler and duplex ultrasound were performed to evaluate the lower extremity deep venous systems from the level of the common femoral vein and including the common femoral, femoral, profunda femoral, popliteal and calf veins including the posterior tibial, peroneal and gastrocnemius veins when visible. The superficial great saphenous vein was also interrogated. Spectral Doppler was utilized to evaluate flow at rest and with distal augmentation maneuvers in the common femoral, femoral and popliteal veins. COMPARISON:  None. FINDINGS: RIGHT LOWER EXTREMITY Common Femoral Vein: No evidence of thrombus. Normal compressibility, respiratory phasicity and response to augmentation. Saphenofemoral Junction: No evidence of thrombus. Normal compressibility and flow on color Doppler imaging. Profunda Femoral Vein: No evidence of thrombus. Normal compressibility and flow on color Doppler imaging. 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 Occlusive and nonocclusive thrombus within the right femoral vein extending inferiorly into the right popliteal vein. Loss of compressibility. Popliteal Vein: Occlusive thrombus within the right popliteal vein. Loss of compressibility. Calf Veins: Not visualized.  DVT not entirely excluded. Superficial Great Saphenous Vein: No evidence of thrombus. Normal compressibility and flow on color Doppler imaging. Other Findings: Mildly prominent right inguinal lymph node demonstrates normal architecture with normal fatty hilum. LEFT LOWER EXTREMITY Common Femoral Vein: No evidence of thrombus. Normal compressibility, respiratory phasicity and response to augmentation. Saphenofemoral Junction: No evidence of thrombus. Normal compressibility and flow on color Doppler imaging.  Profunda Femoral Vein: No evidence of thrombus. Normal compressibility and flow on color Doppler imaging. Femoral Vein: No evidence of thrombus. Normal compressibility, respiratory phasicity and response to augmentation. Popliteal Vein: Occlusive thrombus within the distal left popliteal vein. Loss of compressibility. Calf Veins: Not visualized.  DVT not entirely excluded. Superficial Great Saphenous Vein: No evidence of thrombus. Normal compressibility and flow on color Doppler imaging. Other Findings:  None. IMPRESSION: Positive study with occlusive DVT in the bilateral lower extremities, involving the femoral and popliteal veins on the right and the distal popliteal vein on the left. Critical Value/emergent results were called by telephone at the time of interpretation on 10/28/2016 at 9:54 pm to the nurse practitioner CARI TRIPLETT , who verbally acknowledged these results. Electronically Signed   By: Jeannine Boga M.D.   On: 10/28/2016 21:58    EKG: Pending  Assessment/Plan  This is a 61 y.o. male with a history of CLL which was being monitored but was never treated, diabetes, recent (March '18) right femoral DVT status post thrombectomy and on Eliquis now being admitted with:  #. Acute bilateral lower extremity DVT which are occlusive, involving femoral and popliteal on the right and distal popliteal left -Admit to inpatient -IV heparin drip per pharmacy -Hematology/oncology consultation -Interventional radiology for consideration of a filter -DC Eliquis  #.  History of CLL. White blood cell count has been slowly increasing over the past 6 months. -Heme of consult -Check chest x-ray -monitor CBC  #. Acute kidney injury  - IV fluids and repeat BMP in AM.  -  Avoid nephrotoxic medications - Bladder scan and place foley catheter if evidence of urinary retention  #. History of H/o Diabetes - Accuchecks achs with RISS coverage - Heart healthy, carb controlled diet  Admission  status: Inpatient IV Fluids: NS Diet/Nutrition: HH, CC Consults called: Heme/Onc, IR  DVT Px: Heparin, SCDs and early ambulation. Code Status: Full Code  Disposition Plan: To home in 1-2 days  All the records are reviewed and case discussed with ED provider. Management plans discussed with the patient and/or family who express understanding and agree with plan of care.  Yanett Conkright D.O. on 10/28/2016 at 11:12 PM Between 7am to 6pm - Pager - [phone] After 6pm go to [email or web address] - [employer] [facility] [place] [facility] Office [phone] CC: Primary care physician; [name], MD   10/28/2016, 11:12 PM    Addendum to the initial dictation: After patient's initial admission EKG was done which revealed sinus bradycardia at 54 bpm with normal axis, minimal ST segment elevation in lead 2, 0.1 mm ST elevation in leads 3 and aVF as well as V4 through 6. Patient again was questioned about chest pain which he denies. Without reciprocal changes and without meeting criteria for ST segment elevation I am suspicious for PE with heart strain, therefore we'll order CT angiogram as well as echocardiogram. I did place an inpatient cardiology consult as well. Troponin is pending.

## 2016-10-29 ENCOUNTER — Inpatient Hospital Stay (HOSPITAL_COMMUNITY)
Admit: 2016-10-29 | Discharge: 2016-10-29 | Disposition: A | Discharge status: Home / Self Care | Payer: Medicaid Other | Attending: Family Medicine | Admitting: Family Medicine

## 2016-10-29 ENCOUNTER — Inpatient Hospital Stay: Payer: Medicaid Other

## 2016-10-29 DIAGNOSIS — M79604 Pain in right leg: Secondary | ICD-10-CM

## 2016-10-29 DIAGNOSIS — R739 Hyperglycemia, unspecified: Secondary | ICD-10-CM | POA: Diagnosis present

## 2016-10-29 DIAGNOSIS — I82409 Acute embolism and thrombosis of unspecified deep veins of unspecified lower extremity: Secondary | ICD-10-CM

## 2016-10-29 DIAGNOSIS — R600 Localized edema: Secondary | ICD-10-CM

## 2016-10-29 DIAGNOSIS — R9431 Abnormal electrocardiogram [ECG] [EKG]: Secondary | ICD-10-CM

## 2016-10-29 DIAGNOSIS — E119 Type 2 diabetes mellitus without complications: Secondary | ICD-10-CM

## 2016-10-29 LAB — BASIC METABOLIC PANEL
ANION GAP: 7 (ref 5–15)
BUN: 9 mg/dL (ref 6–20)
CALCIUM: 8.5 mg/dL — AB (ref 8.9–10.3)
CO2: 24 mmol/L (ref 22–32)
Chloride: 105 mmol/L (ref 101–111)
Creatinine, Ser: 1.17 mg/dL (ref 0.61–1.24)
GFR calc Af Amer: >60 mL/min (ref >60)
GLUCOSE: 365 mg/dL — AB (ref 65–99)
Potassium: 4.9 mmol/L (ref 3.5–5.1)
SODIUM: 136 mmol/L (ref 135–145)

## 2016-10-29 LAB — GLUCOSE, CAPILLARY
GLUCOSE-CAPILLARY: 377 mg/dL — AB (ref 65–99)
GLUCOSE-CAPILLARY: 308 mg/dL — AB (ref 65–99)
GLUCOSE-CAPILLARY: 198 mg/dL — AB (ref 65–99)
GLUCOSE-CAPILLARY: 302 mg/dL — AB (ref 65–99)
Glucose-Capillary: 216 mg/dL — ABNORMAL HIGH (ref 65–99)

## 2016-10-29 LAB — ECHOCARDIOGRAM COMPLETE
Height: 75 in
Weight: 4105.6 oz

## 2016-10-29 LAB — CBC
HCT: 42.5 % (ref 40.0–52.0)
HEMOGLOBIN: 14.1 g/dL (ref 13.0–18.0)
MCH: 29.3 pg (ref 26.0–34.0)
MCHC: 33.2 g/dL (ref 32.0–36.0)
MCV: 88.2 fL (ref 80.0–100.0)
Platelets: 112 10*3/uL — ABNORMAL LOW (ref 150–440)
RBC: 4.82 MIL/uL (ref 4.40–5.90)
RDW: 15.9 % — AB (ref 11.5–14.5)
WBC: 91.5 10*3/uL — AB (ref 3.8–10.6)

## 2016-10-29 LAB — PROTIME-INR
INR: 1.11
PROTHROMBIN TIME: 14.3 s (ref 11.4–15.2)

## 2016-10-29 LAB — APTT
APTT: 60 s — AB (ref 24–36)
aPTT: 43 seconds — ABNORMAL HIGH (ref 24–36)
aPTT: 51 seconds — ABNORMAL HIGH (ref 24–36)

## 2016-10-29 LAB — HEPARIN LEVEL (UNFRACTIONATED)
HEPARIN UNFRACTIONATED: 0.57 [IU]/mL (ref 0.30–0.70)
HEPARIN UNFRACTIONATED: 0.58 [IU]/mL (ref 0.30–0.70)
Heparin Unfractionated: 0.44 IU/mL (ref 0.30–0.70)

## 2016-10-29 LAB — TROPONIN I

## 2016-10-29 MED ORDER — SENNOSIDES-DOCUSATE SODIUM 8.6-50 MG PO TABS: 1.0000 | ORAL_TABLET | Freq: Every evening | ORAL | Status: DC | PRN

## 2016-10-29 MED ORDER — SODIUM CHLORIDE 0.9 % IV SOLN: INTRAVENOUS | Status: DC

## 2016-10-29 MED ORDER — ZOLPIDEM TARTRATE 5 MG PO TABS: 5.0000 mg | ORAL_TABLET | Freq: Every evening | ORAL | Status: DC | PRN

## 2016-10-29 MED ORDER — INSULIN GLARGINE 100 UNIT/ML ~~LOC~~ SOLN
12.0000 [IU] | Freq: Every day | SUBCUTANEOUS | Status: DC
  Administered 2016-10-29 – 2016-11-01 (×4): 12 [IU] via SUBCUTANEOUS
  Filled 2016-10-29 (×6): qty 0.12

## 2016-10-29 MED ORDER — HEPARIN (PORCINE) IN NACL 100-0.45 UNIT/ML-% IJ SOLN
1800.0000 [IU]/h | INTRAMUSCULAR | Status: DC
  Administered 2016-10-29 (×2): 1900 [IU]/h via INTRAVENOUS
  Administered 2016-10-30 – 2016-11-01 (×4): 2100 [IU]/h via INTRAVENOUS
  Administered 2016-11-02 – 2016-11-03 (×4): 1800 [IU]/h via INTRAVENOUS
  Filled 2016-10-29 (×10): qty 250

## 2016-10-29 MED ORDER — ONDANSETRON HCL 4 MG PO TABS: 4.0000 mg | ORAL_TABLET | Freq: Four times a day (QID) | ORAL | Status: DC | PRN

## 2016-10-29 MED ORDER — INSULIN ASPART 100 UNIT/ML ~~LOC~~ SOLN: 0.0000 [IU] | Freq: Every day | SUBCUTANEOUS | Status: DC

## 2016-10-29 MED ORDER — ALBUTEROL SULFATE (2.5 MG/3ML) 0.083% IN NEBU: 2.5000 mg | INHALATION_SOLUTION | Freq: Four times a day (QID) | RESPIRATORY_TRACT | Status: DC | PRN

## 2016-10-29 MED ORDER — BISACODYL 5 MG PO TBEC: 5.0000 mg | DELAYED_RELEASE_TABLET | Freq: Every day | ORAL | Status: DC | PRN

## 2016-10-29 MED ORDER — DEXTROSE 5 % IV SOLN
1.5000 g | INTRAVENOUS | Status: AC
  Administered 2016-10-30: 1.5 g via INTRAVENOUS
  Filled 2016-10-29: qty 1.5

## 2016-10-29 MED ORDER — ASPIRIN 325 MG PO TABS: 325.0000 mg | ORAL_TABLET | Freq: Once | ORAL | Status: DC

## 2016-10-29 MED ORDER — ASPIRIN 81 MG PO CHEW
CHEWABLE_TABLET | ORAL | Status: AC
  Filled 2016-10-29: qty 4

## 2016-10-29 MED ORDER — SODIUM CHLORIDE 0.9 % IV SOLN
INTRAVENOUS | Status: DC
  Administered 2016-10-29 – 2016-11-02 (×7): via INTRAVENOUS

## 2016-10-29 MED ORDER — ACETAMINOPHEN 650 MG RE SUPP: 650.0000 mg | Freq: Four times a day (QID) | RECTAL | Status: DC | PRN

## 2016-10-29 MED ORDER — INSULIN ASPART 100 UNIT/ML ~~LOC~~ SOLN
0.0000 [IU] | SUBCUTANEOUS | Status: DC
  Administered 2016-10-29: 11 [IU] via SUBCUTANEOUS
  Administered 2016-10-29: 7 [IU] via SUBCUTANEOUS
  Administered 2016-10-29: 15 [IU] via SUBCUTANEOUS
  Administered 2016-10-29: 4 [IU] via SUBCUTANEOUS
  Administered 2016-10-30: 15 [IU] via SUBCUTANEOUS
  Administered 2016-10-30: 11 [IU] via SUBCUTANEOUS
  Administered 2016-10-30: 3 [IU] via SUBCUTANEOUS
  Administered 2016-10-30: 4 [IU] via SUBCUTANEOUS
  Administered 2016-10-31: 7 [IU] via SUBCUTANEOUS
  Administered 2016-10-31 (×2): 4 [IU] via SUBCUTANEOUS
  Administered 2016-10-31 – 2016-11-01 (×3): 7 [IU] via SUBCUTANEOUS
  Administered 2016-11-01: 4 [IU] via SUBCUTANEOUS
  Administered 2016-11-01: 7 [IU] via SUBCUTANEOUS
  Administered 2016-11-01: 3 [IU] via SUBCUTANEOUS
  Administered 2016-11-01: 11 [IU] via SUBCUTANEOUS
  Administered 2016-11-01: 7 [IU] via SUBCUTANEOUS
  Administered 2016-11-02: 4 [IU] via SUBCUTANEOUS
  Administered 2016-11-02: 7 [IU] via SUBCUTANEOUS
  Administered 2016-11-02 (×2): 4 [IU] via SUBCUTANEOUS
  Administered 2016-11-03: 7 [IU] via SUBCUTANEOUS
  Administered 2016-11-03 (×2): 4 [IU] via SUBCUTANEOUS
  Administered 2016-11-03 (×2): 3 [IU] via SUBCUTANEOUS
  Administered 2016-11-03 – 2016-11-04 (×3): 7 [IU] via SUBCUTANEOUS
  Administered 2016-11-04: 3 [IU] via SUBCUTANEOUS
  Administered 2016-11-04: 4 [IU] via SUBCUTANEOUS
  Filled 2016-10-29 (×33): qty 1

## 2016-10-29 MED ORDER — ASPIRIN 81 MG PO CHEW
324.0000 mg | CHEWABLE_TABLET | Freq: Once | ORAL | Status: AC
  Administered 2016-10-29: 324 mg via ORAL

## 2016-10-29 MED ORDER — IPRATROPIUM BROMIDE 0.02 % IN SOLN: 0.5000 mg | Freq: Four times a day (QID) | RESPIRATORY_TRACT | Status: DC | PRN

## 2016-10-29 MED ORDER — IOPAMIDOL (ISOVUE-370) INJECTION 76%
100.0000 mL | Freq: Once | INTRAVENOUS | Status: AC | PRN
  Administered 2016-10-29: 100 mL via INTRAVENOUS

## 2016-10-29 MED ORDER — INSULIN ASPART 100 UNIT/ML ~~LOC~~ SOLN: 0.0000 [IU] | Freq: Three times a day (TID) | SUBCUTANEOUS | Status: DC

## 2016-10-29 MED ORDER — ONDANSETRON HCL 4 MG/2ML IJ SOLN: 4.0000 mg | Freq: Four times a day (QID) | INTRAMUSCULAR | Status: DC | PRN

## 2016-10-29 MED ORDER — OXYCODONE HCL 5 MG PO TABS
5.0000 mg | ORAL_TABLET | ORAL | Status: DC | PRN
  Administered 2016-10-29 – 2016-10-31 (×4): 5 mg via ORAL
  Filled 2016-10-29 (×4): qty 1

## 2016-10-29 MED ORDER — ACETAMINOPHEN 325 MG PO TABS: 650.0000 mg | ORAL_TABLET | Freq: Four times a day (QID) | ORAL | Status: DC | PRN

## 2016-10-29 MED ORDER — MAGNESIUM CITRATE PO SOLN: 1.0000 | Freq: Once | ORAL | Status: DC | PRN

## 2016-10-29 NOTE — ED Notes (Signed)
EKG was performed and acute MI read on EKG and Dr. Joseph Art paged to be aware and to see EKG. Dr. Joseph Art asked to draw troponin and hold patient in ED until after she talks to cardiology.

## 2016-10-29 NOTE — Consult Note (Signed)
Cardiology Consultation Note  Patient ID: Anthony Hart., MRN: [id number], DOB/AGE: 59-Jan-1957 59 y.o. Admit date: 10/28/2016   Date of Consult: 10/29/2016 Primary Physician: Anthony Castle, MD Primary Cardiologist: New to [facility] - consult by Anthony Hart Requesting Physician: Dr. Ara Kussmaul, DO  Chief Complaint: Leg pain Reason for Consult: Abnormal EKG  HPI: Anthony Hart. is a 61 y.o. male who is being seen today for the evaluation of abnromal EKG at the request of Dr. Ara Kussmaul, DO. Patient has a h/o untreated CLL, recent right femoral DVT in March 2018 status post thrombectomy on Eliquis, and poorly controlled diabetes who presented to [facility] on 7/18 with a five-day history of bilateral leg pain and was found to have acute bilateral DVT.  Patient without any previously known cardiac history. 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 He has not missed any doses of his Eliquis since being initiated on this therapy in March 2018. Never with any chest pain or shortness of breath. Patient decided he needed to follow one more load in his truck prior to coming to the ED and eventually made his way to the ED on 7/18 where he was found to have acute bilateral DVT via lower extremity ultrasound. He was placed on heparin drip. EKG was checked and showed sinus bradycardia with nonspecific inferolateral ST elevation not meeting diagnostic criteria for STEMI. Upon review of prior EKG done in March 2018 it is relatively consistent with prior study. Cardiology is asked to further evaluate. Vital signs have been stable. CTA chest negative for PE. Chest x-ray nonacute. CBC showed white blood cell count 88,000 consistent with his known CLL along with a thrombocytopenia of 118. Serum creatinine upon admission noted to be elevated at 1.25 the baseline of 0.82 1.0. His renal function has improved with IV fluids. Troponin negative 1. Vascular consult pending. Never with  chest pain or shortness of breath.  Past Medical History:  Diagnosis Date  . Cancer ([place])    CLL monitored   . Clotting disorder ([place])   . Diabetes mellitus without complication ([place])    Type II      Most Recent Cardiac Studies: none   Surgical History:  Past Surgical History:  Procedure Laterality Date  . APPENDECTOMY    . COLONOSCOPY WITH PROPOFOL  02/04/2015   Procedure: COLONOSCOPY WITH PROPOFOL;  Surgeon: Anthony Luster, MD;  Location: [facility] ENDOSCOPY;  Service: Gastroenterology;;  . ESOPHAGOGASTRODUODENOSCOPY (EGD) WITH PROPOFOL N/A 02/04/2015   Procedure: ESOPHAGOGASTRODUODENOSCOPY (EGD) WITH PROPOFOL;  Surgeon: Anthony Luster, MD;  Location: [facility] ENDOSCOPY;  Service: Gastroenterology;  Laterality: N/A;  . PERIPHERAL VASCULAR THROMBECTOMY Right 07/02/2016   Procedure: Peripheral Vascular Thrombectomy and IVC filter;  Surgeon: Anthony Huxley, MD;  Location: [facility] LAB;  Service: Cardiovascular;  Laterality: Right;     Home Meds: Prior to Admission medications   Medication Sig Start Date End Date Taking? Authorizing Provider  apixaban (ELIQUIS) 5 MG TABS tablet Take 5 mg by mouth 2 (two) times daily.   Yes [provider]  metFORMIN (GLUCOPHAGE-XR) 750 MG 24 hr tablet Take 750 mg by mouth daily. 03/09/16 03/09/17 Yes [provider]    Inpatient Medications:  . insulin aspart  0-20 Units Subcutaneous Q4H   . sodium chloride 75 mL/hr at 10/29/16 0522  . heparin      Allergies: No Known Allergies  Social History   Social History  . Marital status: Single  Spouse name: N/A  . Number of children: N/A  . Years of education: N/A   Occupational History  . Not on file.   Social History Main Topics  . Smoking status: Never Smoker  . Smokeless tobacco: Never Used  . Alcohol use No  . Drug use: No  . Sexual activity: Not on file   Other Topics Concern  . Not on file   Social History Narrative  . No narrative on file     Family History    Problem Relation Age of Onset  . Diabetes Mother   . Cancer Brother   . Cancer Maternal Aunt   . Stroke Maternal Uncle      Review of Systems: Review of Systems  Constitutional: Positive for malaise/fatigue. Negative for chills, diaphoresis, fever and weight loss.  HENT: Negative for congestion.   Eyes: Negative for discharge and redness.  Respiratory: Negative for cough, hemoptysis, sputum production, shortness of breath and wheezing.   Cardiovascular: Positive for leg swelling. Negative for chest pain, palpitations, orthopnea, claudication and PND.  Gastrointestinal: Negative for abdominal pain, blood in stool, heartburn, melena, nausea and vomiting.  Genitourinary: Negative for hematuria.  Musculoskeletal: Positive for myalgias. Negative for falls.  Skin: Negative for rash.  Neurological: Positive for weakness. Negative for dizziness, tingling, tremors, sensory change, speech change, focal weakness and loss of consciousness.  Endo/Heme/Allergies: Does not bruise/bleed easily.  Psychiatric/Behavioral: Negative for substance abuse. The patient is not nervous/anxious.   All other systems reviewed and are negative.   Labs:  Recent Labs  10/29/16 0121  TROPONINI <0.03   Lab Results  Component Value Date   WBC 91.5 (HH) 10/29/2016   HGB 14.1 10/29/2016   HCT 42.5 10/29/2016   MCV 88.2 10/29/2016   PLT 112 (L) 10/29/2016     Recent Labs Lab 10/29/16 0507  NA 136  K 4.9  CL 105  CO2 24  BUN 9  CREATININE 1.17  CALCIUM 8.5*  GLUCOSE 365*   No results found for: CHOL, HDL, LDLCALC, TRIG No results found for: DDIMER  Radiology/Studies:  Dg Chest 1 View  Result Date: 10/29/2016 IMPRESSION: No active disease. Electronically Signed   By: Anthony Hart M.D.   On: 10/29/2016 01:29   Ct Angio Chest Pe W And/or Wo Contrast  Result Date: 10/29/2016 IMPRESSION: 1. Limited evaluation of the pulmonary arteries due to streak artifact and suboptimal opacification of the  peripheral branches. No central or lobar pulmonary artery embolus identified. 2. Subsegmental bibasilar atelectatic changes, left greater right. Pneumonia is less likely. Clinical correlation is recommend. Electronically Signed   By: Anthony Hart M.D.   On: 10/29/2016 02:46   US Venous Img Lower Bilateral  Result Date: 10/28/2016 IMPRESSION: Positive study with occlusive DVT in the bilateral lower extremities, involving the femoral and popliteal veins on the right and the distal popliteal vein on the left. Critical Value/emergent results were called by telephone at the time of interpretation on 10/28/2016 at 9:54 pm to the nurse practitioner CARI TRIPLETT , who verbally acknowledged these results. Electronically Signed   By: Jeannine Boga M.D.   On: 10/28/2016 21:58    EKG: Interpreted by me showed: sinus bradycardia, 54 bpm, nonspecific inferolateral ST elevation not meeting diagnostic criteria for ST elevation MI, T-wave inversion aVL/V2 Telemetry: Interpreted by me showed: NSR  Weights: Filed Weights   10/28/16 1922 10/29/16 0441  Weight: 266 lb (120.7 kg) 256 lb 9.6 oz (116.4 kg)     Physical Exam: Blood pressure  118/76, pulse 61, temperature (!) 97.5 F (36.4 C), temperature source Oral, resp. rate 20, height 6\' 3"  (1.905 m), weight 256 lb 9.6 oz (116.4 kg), SpO2 97 %. Body mass index is 32.07 kg/m. General: Well developed, well nourished, in no acute distress. Head: Normocephalic, atraumatic, sclera non-icteric, no xanthomas, nares are without discharge.  Neck: Negative for carotid bruits. JVD not elevated. Lungs: Clear bilaterally to auscultation without wheezes, rales, or rhonchi. Breathing is unlabored. Heart: RRR with S1 S2. No murmurs, rubs, or gallops appreciated. Abdomen: Soft, non-tender, non-distended with normoactive bowel sounds. No hepatomegaly. No rebound/guarding. No obvious abdominal masses. Msk:  Strength and tone appear normal for age. Extremities: No  clubbing or cyanosis. No edema. Distal pedal pulses are 2+ and equal bilaterally. Neuro: Alert and oriented X 3. No facial asymmetry. No focal deficit. Moves all extremities spontaneously. Psych:  Responds to questions appropriately with a normal affect.    Assessment and Plan:  Principal Problem:   Leg DVT (deep venous thromboembolism), acute, bilateral (HCC) Active Problems:   CLL (chronic lymphocytic leukemia) (HCC)   Abnormal EKG   Hyperglycemia    1. Abnormal EKG: -Completely asymptomatic -EKG is somewhat consistent with prior evaluation done in March 2018 -Recommend checking transthoracic echocardiogram. If normal no further inpatient ischemic evaluation is recommended  2. 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 Poorly controlled diabetes: -Blood sugars have ranged in the 300s to 400s -Recommend checking hemoglobin A1c, will defer to internal medicine -Recommend diabetic coordinator consult, will defer to IM  4. CLL: -Per IM   Signed, Christell Faith, PA-C [facility] Pager: [phone] 10/29/2016, 10:46 AM

## 2016-10-29 NOTE — Progress Notes (Signed)
Inpatient Diabetes Program Recommendations  AACE/ADA: New Consensus Statement on Inpatient Glycemic Control (2015)  Target Ranges:  Prepandial:   less than 140 mg/dL      Peak postprandial:   less than 180 mg/dL (1-2 hours)      Critically ill patients:  140 - 180 mg/dL  Results for Anthony, Hart (MRN [id number]) as of 10/29/2016 08:47  Ref. Range 10/29/2016 04:45 10/29/2016 07:39  Glucose-Capillary Latest Ref Range: 65 - 99 mg/dL 377 (H) 308 (H)   Results for Anthony, Hart (MRN [id number]) as of 10/29/2016 08:47  Ref. Range 10/28/2016 19:24 10/29/2016 05:07  Glucose Latest Ref Range: 65 - 99 mg/dL 450 (H) 365 (H)  Results for Anthony, Hart (MRN [id number]) as of 10/29/2016 08:47  Ref. Range 07/01/2016 03:34  Hemoglobin A1C Latest Ref Range: 4.8 - 5.6 % 7.8 (H)   Review of Glycemic Control  Diabetes history: DM2 Outpatient Diabetes medications: Metformin 750 mg daily Current orders for Inpatient glycemic control: Novolog 0-20 units Q4H  Inpatient Diabetes Program Recommendations: HgbA1C: Last A1C in the chart was 7.8% on 07/01/16. Initial glucose was 450 mg/dl on 10/27/16. Please order an A1C to evaluate glycemic control over the past 2-3 months.  Thanks, Barnie Alderman, RN, MSN, CDE Diabetes Coordinator Inpatient Diabetes Program [phone] (Team Pager from 8am to 5pm)

## 2016-10-29 NOTE — Progress Notes (Signed)
ANTICOAGULATION CONSULT NOTE  Pharmacy Consult for heparin drip Indication: DVT  No Known Allergies  Patient Measurements: Height: 6\' 3"  (190.5 cm) Weight: 256 lb 9.6 oz (116.4 kg) IBW/kg (Calculated) : 84.5 Heparin Dosing Weight: 110.1 kg  Vital Signs: Temp: 97.9 F (36.6 C) (07/19 1935) Temp Source: Oral (07/19 1935) BP: 111/75 (07/19 1935) Pulse Rate: 72 (07/19 1935)  Labs:  Recent Labs  10/28/16 1924  10/28/16 2308 10/29/16 0121 10/29/16 0507 10/29/16 0958 10/29/16 1909  HGB 14.2  --   --   --  14.1  --   --   HCT 44.9  --   --   --  42.5  --   --   PLT 118*  --   --   --  112*  --   --   APTT  --   < > 26  --  43* 51* 60*  LABPROT  --   --  13.5  --  14.3  --   --   INR  --   --  1.03  --  1.11  --   --   HEPARINUNFRC  --   < > 0.59  --  0.57 0.58 0.44  CREATININE 1.25*  --   --   --  1.17  --   --   TROPONINI  --   --   --  <0.03  --   --   --   < > = values in this interval not displayed.  Estimated Creatinine Clearance: 94.7 mL/min (by C-G formula based on SCr of 1.17 mg/dL).   Medical History: Past Medical History:  Diagnosis Date  . Cancer ([place])    CLL monitored   . Clotting disorder ([place])   . 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 insulin aspart  0-20 Units Subcutaneous Q4H  . insulin glargine  12 Units Subcutaneous QHS    Assessment: Patient admitted w/ bilateral leg pain, found to have occlusive DVT being started on heparin drip. Patient takes apixaban 5mg  bid at home.  Goal of Therapy:  Heparin level 0.3-0.7 units/ml corresponding Target aPTT is 66-102s Monitor platelets by anticoagulation protocol: Yes   Plan:  7/19 @ 1900 aPTT 60, HL 0.44 subtherapeutic. Levels do not correlate. Will increase rate to 2100 units/hr. Recheck APTT in 6 hours  Miquan Tandon D Burley [place], Pharm.D, BCPS Clinical Pharmacist 10/29/2016

## 2016-10-29 NOTE — Consult Note (Signed)
[facility] Vascular Consult Note  MRN : [id number]  Anthony Hart. is a 61 y.o. (10-01-1955) male who presents with chief complaint of  Chief Complaint  Patient presents with  . Leg Pain  . Weakness  .  History of Present Illness: I am asked to see the patient by Internal Medicine, Dr. Tressia Miners for recurrent DVT. He is a gentleman I last saw about 4 months ago when he had a very extensive right lower extremity DVT including right iliac vein occlusion. He had massive right leg swelling at that time and we performed venous intervention with some improvement in his right lower extremity swelling although far from resolution. He had an IVC filter placed at that time. He has not return to clinic in follow-up and this remains in place. 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 He began having pain and swelling in the left leg. This was not nearly as severe as it was in the right leg several months ago. He has also noticed more swelling in the right leg. He does not have any chest pain or shortness of breath. A CT scan of the chest did not show any obvious pulmonary embolus. He reports the swelling to be some better after the initiation of heparin here. He was on Eliquis. A DVT study performed on admission here shows bilateral lower extremity DVT with femoral and popliteal DVT on the right and popliteal DVT on the left. 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 There is also a distinct possibility of IVC filter thrombosis.  Current Facility-Administered Medications  Medication Dose Route Frequency Provider Last Rate Last Dose  . 0.9 %  sodium chloride infusion   Intravenous Continuous Hugelmeyer, Alexis, DO 75 mL/hr at 10/29/16 0522    . acetaminophen (TYLENOL) tablet 650 mg  650 mg Oral Q6H PRN Hugelmeyer,  Alexis, DO       Or  . acetaminophen (TYLENOL) suppository 650 mg  650 mg Rectal Q6H PRN Hugelmeyer, Alexis, DO      . albuterol (PROVENTIL) (2.5 MG/3ML) 0.083% nebulizer solution 2.5 mg  2.5 mg Nebulization Q6H PRN Hugelmeyer, Alexis, DO      . bisacodyl (DULCOLAX) EC tablet 5 mg  5 mg Oral Daily PRN Hugelmeyer, Alexis, DO      . heparin ADULT infusion 100 units/mL (25000 units/248mL sodium chloride 0.45%)  1,900 Units/hr Intravenous Continuous Gladstone Lighter, MD 19 mL/hr at 10/29/16 1426 1,900 Units/hr at 10/29/16 1426  . insulin aspart (novoLOG) injection 0-20 Units  0-20 Units Subcutaneous Q4H Hugelmeyer, Alexis, DO   7 Units at 10/29/16 1141  . insulin glargine (LANTUS) injection 12 Units  12 Units Subcutaneous QHS Gladstone Lighter, MD      . ipratropium (ATROVENT) nebulizer solution 0.5 mg  0.5 mg Nebulization Q6H PRN Hugelmeyer, Alexis, DO      . magnesium citrate solution 1 Bottle  1 Bottle Oral Once PRN Hugelmeyer, Alexis, DO      . ondansetron (ZOFRAN) tablet 4 mg  4 mg Oral Q6H PRN Hugelmeyer, Alexis, DO       Or  . ondansetron (ZOFRAN) injection 4 mg  4 mg Intravenous Q6H PRN Hugelmeyer, Alexis, DO      . oxyCODONE (Oxy IR/ROXICODONE) immediate release tablet 5 mg  5 mg Oral Q4H PRN Hugelmeyer, Alexis,  DO      . senna-docusate (Senokot-S) tablet 1 tablet  1 tablet Oral QHS PRN Hugelmeyer, Alexis, DO      . zolpidem (AMBIEN) tablet 5 mg  5 mg Oral QHS PRN,MR X 1 Hugelmeyer, Alexis, DO        Past Medical History:  Diagnosis Date  . Cancer ([place])    CLL monitored   . Clotting disorder ([place])   . Diabetes mellitus without complication ([place])    Type II    Past Surgical History:  Procedure Laterality Date  . APPENDECTOMY    . COLONOSCOPY WITH PROPOFOL  02/04/2015   Procedure: COLONOSCOPY WITH PROPOFOL;  Surgeon: [name], MD;  Location: [facility] ENDOSCOPY;  Service: Gastroenterology;;  . ESOPHAGOGASTRODUODENOSCOPY (EGD) WITH PROPOFOL N/A 02/04/2015   Procedure:  ESOPHAGOGASTRODUODENOSCOPY (EGD) WITH PROPOFOL;  Surgeon: [name], MD;  Location[facility] ENDOSCOPY;  Service: Gastroenterology;  Laterality: N/A;  . PERIPHERAL VASCULAR THROMBECTOMY Right 07/02/2016   Procedure: Peripheral Vascular Thrombectomy and IVC filter;  Surgeon: [name], MD;  Location: [facility];  Service: Cardiovascular;  Laterality: Right;    Social History Social History  Substance Use Topics  . Smoking status: Never Smoker  . Smokeless tobacco: Never Used  . Alcohol use No  No IV drug use  Family History Family History  Problem Relation Age of Onset  . Diabetes Mother   . Cancer Brother   . Cancer Maternal Aunt   . Stroke Maternal Uncle     No Known Allergies   REVIEW OF SYSTEMS (Negative unless checked)  Constitutional: [] Weight loss  [] Fever  [] Chills Cardiac: [] Chest pain   [] Chest pressure   [] Palpitations   [] Shortness of breath when laying flat   [] Shortness of breath at rest   [] Shortness of breath with exertion. Vascular:  [] Pain in legs with walking   [x] Pain in legs at rest   [] Pain in legs when laying flat   [] Claudication   [] Pain in feet when walking  [] Pain in feet at rest  [] Pain in feet when laying flat   [x] History of DVT   [] Phlebitis   [x] Swelling in legs   [] Varicose veins   [] Non-healing ulcers Pulmonary:   [] Uses home oxygen   [] Productive cough   [] Hemoptysis   [] Wheeze  [] COPD   [] Asthma Neurologic:  [] Dizziness  [] Blackouts   [] Seizures   [] History of stroke   [] History of TIA  [] Aphasia   [] Temporary blindness   [] Dysphagia   [] Weakness or numbness in arms   [] Weakness or numbness in legs Musculoskeletal:  [] Arthritis   [] Joint swelling   [] Joint pain   [] Low back pain Hematologic:  [] Easy bruising  [] Easy bleeding   [x] Hypercoagulable state   [] Anemic  [] Hepatitis Gastrointestinal:  [] Blood in stool   [] Vomiting blood  [] Gastroesophageal reflux/heartburn   [] Difficulty swallowing. Genitourinary:  [] Chronic kidney disease    [] Difficult urination  [] Frequent urination  [] Burning with urination   [] Blood in urine Skin:  [] Rashes   [] Ulcers   [] Wounds Psychological:  [] History of anxiety   []  History of major depression.  Physical Examination  Vitals:   10/29/16 0400 10/29/16 0441 10/29/16 0755 10/29/16 1141  BP: 117/79 117/73 118/76 113/70  Pulse: (!) 51 (!) 58 61 64  Resp: 12 16 20 18   Temp:  (!) 97.5 F (36.4 C)  98.4 F (36.9 C)  TempSrc:  Oral    SpO2: 95% 95% 97% 97%  Weight:  116.4 kg (256 lb 9.6 oz)  Height:  6\' 3"  (1.905 m)     Body mass index is 32.07 kg/m. Gen:  WD/WN, NAD Head: [place]/AT, No temporalis wasting. Ear/Nose/Throat: Hearing grossly intact, nares w/o erythema or drainage, oropharynx w/o Erythema/Exudate Eyes: Sclera non-icteric, conjunctiva clear Neck: Trachea midline.  No JVD.  Pulmonary:  Good air movement, respirations not labored, equal bilaterally.  Cardiac: RRR, normal S1, S2. Vascular:  Vessel Right Left  Radial Palpable Palpable                          PT Palpable Palpable  DP Palpable Palpable   Gastrointestinal: soft, non-tender/non-distended. No guarding/reflex.  Musculoskeletal: M/S 5/5 throughout.  Extremities without ischemic changes.  No deformity or atrophy. 2+ right lower extremity swelling, 1+ left lower extremity swelling. Neurologic: Sensation grossly intact in extremities.  Symmetrical.  Speech is fluent. Motor exam as listed above. Psychiatric: Judgment intact, Mood & affect appropriate for pt's clinical situation. Dermatologic: No rashes or ulcers noted.  No cellulitis or open wounds.       CBC Lab Results  Component Value Date   WBC 91.5 (HH) 10/29/2016   HGB 14.1 10/29/2016   HCT 42.5 10/29/2016   MCV 88.2 10/29/2016   PLT 112 (L) 10/29/2016    BMET    Component Value Date/Time   NA 136 10/29/2016 0507   NA 141 12/23/2013 1350   K 4.9 10/29/2016 0507   K 4.9 12/23/2013 1350   CL 105 10/29/2016 0507   CL 108 (H) 12/23/2013  1350   CO2 24 10/29/2016 0507   CO2 28 12/23/2013 1350   GLUCOSE 365 (H) 10/29/2016 0507   GLUCOSE 90 12/23/2013 1350   BUN 9 10/29/2016 0507   BUN 9 12/23/2013 1350   CREATININE 1.17 10/29/2016 0507   CREATININE 1.17 12/23/2013 1350   CALCIUM 8.5 (L) 10/29/2016 0507   CALCIUM 9.1 12/23/2013 1350   GFRNONAA >60 10/29/2016 0507   GFRNONAA >60 12/23/2013 1350   GFRAA >60 10/29/2016 0507   GFRAA >60 12/23/2013 1350   Estimated Creatinine Clearance: 94.7 mL/min (by C-G formula based on SCr of 1.17 mg/dL).  COAG Lab Results  Component Value Date   INR 1.11 10/29/2016   INR 1.03 10/28/2016   INR 1.08 06/30/2016    Radiology Dg Chest 1 View  Result Date: 10/29/2016 CLINICAL DATA:  61 year old male with history of CLL.  No symptoms. EXAM: CHEST 1 VIEW COMPARISON:  Chest CT dated 07/01/2016 FINDINGS: The heart size and mediastinal contours are within normal limits. Both lungs are clear. The visualized skeletal structures are unremarkable. IMPRESSION: No active disease. Electronically Signed   By: Anner Crete M.D.   On: 10/29/2016 01:29   Ct Angio Chest Pe W And/or Wo Contrast  Result Date: 10/29/2016 CLINICAL DATA:  61 year old male with bilateral lower extremity tenderness and pain. Concern for PE. EXAM: CT ANGIOGRAPHY CHEST WITH CONTRAST TECHNIQUE: Multidetector CT imaging of the chest was performed using the standard protocol during bolus administration of intravenous contrast. Multiplanar CT image reconstructions and MIPs were obtained to evaluate the vascular anatomy. CONTRAST:  100 cc Isovue 370 COMPARISON:  Chest radiograph dated 10/29/2016 FINDINGS: Cardiovascular: Top-normal cardiac size. No pericardial effusion. The thoracic aorta is unremarkable. The origins of the great vessels of the aortic arch appear patent as visualized. Evaluation of the pulmonary arteries is limited due to streak artifact caused by patient's arms and suboptimal opacification of the peripheral branches.  No central or lobar pulmonary artery emboli  identified. Mediastinum/Nodes: There is no hilar or mediastinal adenopathy. Top-normal right hilar lymph nodes, likely reactive. The esophagus and the thyroid gland are grossly unremarkable. No mediastinal fluid collection. Lungs/Pleura: Bibasilar atelectatic changes, left greater right. Pneumonia is not excluded. There is no pleural effusion or pneumothorax. The central airways are patent. Upper Abdomen: Apparent fatty infiltration of the liver. The visualized upper abdomen is otherwise unremarkable. Musculoskeletal: No chest wall abnormality. No acute or significant osseous findings. Review of the MIP images confirms the above findings. IMPRESSION: 1. Limited evaluation of the pulmonary arteries due to streak artifact and suboptimal opacification of the peripheral branches. No central or lobar pulmonary artery embolus identified. 2. Subsegmental bibasilar atelectatic changes, left greater right. Pneumonia is less likely. Clinical correlation is recommend. Electronically Signed   By: Anner Crete M.D.   On: 10/29/2016 02:46   US Venous Img Lower Bilateral  Result Date: 10/28/2016 CLINICAL DATA:  Initial evaluation for acute bilateral lower extremity pain. EXAM: BILATERAL LOWER EXTREMITY VENOUS DOPPLER ULTRASOUND TECHNIQUE: Gray-scale sonography with graded compression, as well as color Doppler and duplex ultrasound were performed to evaluate the lower extremity deep venous systems from the level of the common femoral vein and including the common femoral, femoral, profunda femoral, popliteal and calf veins including the posterior tibial, peroneal and gastrocnemius veins when visible. The superficial great saphenous vein was also interrogated. Spectral Doppler was utilized to evaluate flow at rest and with distal augmentation maneuvers in the common femoral, femoral and popliteal veins. COMPARISON:  None. FINDINGS: RIGHT LOWER EXTREMITY Common Femoral Vein: No  evidence of thrombus. Normal compressibility, respiratory phasicity and response to augmentation. Saphenofemoral Junction: No evidence of thrombus. Normal compressibility and flow on color Doppler imaging. Profunda Femoral Vein: No evidence of thrombus. Normal compressibility and flow on color Doppler imaging. 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 Occlusive and nonocclusive thrombus within the right femoral vein extending inferiorly into the right popliteal vein. Loss of compressibility. Popliteal Vein: Occlusive thrombus within the right popliteal vein. Loss of compressibility. Calf Veins: Not visualized.  DVT not entirely excluded. Superficial Great Saphenous Vein: No evidence of thrombus. Normal compressibility and flow on color Doppler imaging. Other Findings: Mildly prominent right inguinal lymph node demonstrates normal architecture with normal fatty hilum. LEFT LOWER EXTREMITY Common Femoral Vein: No evidence of thrombus. Normal compressibility, respiratory phasicity and response to augmentation. Saphenofemoral Junction: No evidence of thrombus. Normal compressibility and flow on color Doppler imaging. Profunda Femoral Vein: No evidence of thrombus. Normal compressibility and flow on color Doppler imaging. Femoral Vein: No evidence of thrombus. Normal compressibility, respiratory phasicity and response to augmentation. Popliteal Vein: Occlusive thrombus within the distal left popliteal vein. Loss of compressibility. Calf Veins: Not visualized.  DVT not entirely excluded. Superficial Great Saphenous Vein: No evidence of thrombus. Normal compressibility and flow on color Doppler imaging. Other Findings:  None. IMPRESSION: Positive study with occlusive DVT in the bilateral lower extremities, involving the femoral and popliteal veins on the right and the distal popliteal vein on the left. Critical Value/emergent results were called by telephone at the time of interpretation on 10/28/2016 at 9:54 pm to  the nurse practitioner CARI TRIPLETT , who verbally acknowledged these results. Electronically Signed   By: Jeannine Boga M.D.   On: 10/28/2016 21:58      Assessment/Plan 1. Recurrent, bilateral lower extremity DVT. I have had a long discussion today with the patient regarding treatment options. If it were just the documented thrombus burden in the left lower extremity,  venous thrombectomy and thrombolytic therapy would not typically be performed. I have a high index suspicion of iliac vein and caval thrombosis given his previous history and for that reason that would certainly be a reasonable option. He has an IVC filter already in place. I would not remove it on this admission with active DVT. He is actually more symptomatic on the left leg than the right leg at this time, although I suspect that he has more clot on the right leg is just older and more well collateralized. I think a venous thrombectomy and thrombolytic procedure on the left leg and potentially on the right are reasonable and have discussed that with the patient. Changing anticoagulants is also a reasonable option. He prefers a venous intervention and I will schedule this for tomorrow. 2. CLL. Certainly could contribute to a hypercoagulable state. Hematology/oncology is seeing him 3. Diabetes. Stable on outpatient medications and blood glucose control important in reducing the progression of atherosclerotic disease. Also, involved in wound healing. On appropriate medications.    [name], MD  10/29/2016 5:51 PM    This note was created with Dragon medical transcription system.  Any error is purely unintentional

## 2016-10-29 NOTE — Progress Notes (Signed)
Anthony Hartwas transferred from the Er following c/o bilateral lower extremities pain. On admission patient denied pain on both legs, was A&O X4, VSS ,asymptomatic sinus brady and has heparin infusing. Patient has palpable  Pulses on all extremities.  Patient and wife were oriented to the room and allowed to contribute to his care planPatient has no acute event overnight. Wife was at bedside overnight.

## 2016-10-29 NOTE — Progress Notes (Signed)
[facility] NAME: Anthony Hart    MR#:  [id number]  DATE OF BIRTH:  [birth year]  SUBJECTIVE:  CHIEF COMPLAINT:   Chief Complaint  Patient presents with  . Leg Pain  . Weakness   -Bilateral leg swelling and pain worse on the left side for a week now. -Bilateral DVT noted while on eliquis.  REVIEW OF SYSTEMS:  Review of Systems  Constitutional: Negative for chills, fever and malaise/fatigue.  HENT: Negative for ear discharge, hearing loss and nosebleeds.   Eyes: Negative for blurred vision and double vision.  Respiratory: Negative for cough, shortness of breath and wheezing.   Cardiovascular: Positive for leg swelling. Negative for chest pain and palpitations.  Gastrointestinal: Negative for abdominal pain, constipation, diarrhea, nausea and vomiting.  Genitourinary: Negative for dysuria.  Musculoskeletal: Positive for myalgias.  Neurological: Negative for dizziness, speech change, focal weakness, seizures and headaches.  Psychiatric/Behavioral: Negative for depression.    DRUG ALLERGIES:  No Known Allergies  VITALS:  Blood pressure 113/70, pulse 64, temperature 98.4 F (36.9 C), resp. rate 18, height 6\' 3"  (1.905 m), weight 116.4 kg (256 lb 9.6 oz), SpO2 97 %.  PHYSICAL EXAMINATION:  Physical Exam  GENERAL:  61 y.o.-year-old patient lying in the bed with no acute distress.  EYES: Pupils equal, round, reactive to light and accommodation. No scleral icterus. Extraocular muscles intact.  HEENT: Head atraumatic, normocephalic. Oropharynx and nasopharynx clear.  NECK:  Supple, no jugular venous distention. No thyroid enlargement, no tenderness.  LUNGS: Normal breath sounds bilaterally, no wheezing, rales,rhonchi or crepitation. No use of accessory muscles of respiration.  CARDIOVASCULAR: S1, S2 normal. No murmurs, rubs, or gallops.  ABDOMEN: Soft, nontender, nondistended. Bowel sounds present. No organomegaly or mass.    EXTREMITIES: No pedal edema, cyanosis, or clubbing. Minimal calf tenderness noted on the left leg NEUROLOGIC: Cranial nerves II through XII are intact. Muscle strength 5/5 in all extremities. Sensation intact. Gait not checked.  PSYCHIATRIC: The patient is alert and oriented x 3.  SKIN: No obvious rash, lesion, or ulcer.    LABORATORY PANEL:   CBC  Recent Labs Lab 10/29/16 0507  WBC 91.5*  HGB 14.1  HCT 42.5  PLT 112*   ------------------------------------------------------------------------------------------------------------------  Chemistries   Recent Labs Lab 10/29/16 0507  NA 136  K 4.9  CL 105  CO2 24  GLUCOSE 365*  BUN 9  CREATININE 1.17  CALCIUM 8.5*   ------------------------------------------------------------------------------------------------------------------  Cardiac Enzymes  Recent Labs Lab 10/29/16 0121  TROPONINI <0.03   ------------------------------------------------------------------------------------------------------------------  RADIOLOGY:  Dg Chest 1 View  Result Date: 10/29/2016 CLINICAL DATA:  61 year old male with history of CLL.  No symptoms. EXAM: CHEST 1 VIEW COMPARISON:  Chest CT dated 07/01/2016 FINDINGS: The heart size and mediastinal contours are within normal limits. Both lungs are clear. The visualized skeletal structures are unremarkable. IMPRESSION: No active disease. Electronically Signed   By: Anner Crete M.D.   On: 10/29/2016 01:29   Ct Angio Chest Pe W And/or Wo Contrast  Result Date: 10/29/2016 CLINICAL DATA:  61 year old male with bilateral lower extremity tenderness and pain. Concern for PE. EXAM: CT ANGIOGRAPHY CHEST WITH CONTRAST TECHNIQUE: Multidetector CT imaging of the chest was performed using the standard protocol during bolus administration of intravenous contrast. Multiplanar CT image reconstructions and MIPs were obtained to evaluate the vascular anatomy. CONTRAST:  100 cc Isovue 370 COMPARISON:  Chest  radiograph dated 10/29/2016 FINDINGS: Cardiovascular: Top-normal cardiac size. No pericardial effusion. The  thoracic aorta is unremarkable. The origins of the great vessels of the aortic arch appear patent as visualized. Evaluation of the pulmonary arteries is limited due to streak artifact caused by patient's arms and suboptimal opacification of the peripheral branches. No central or lobar pulmonary artery emboli identified. Mediastinum/Nodes: There is no hilar or mediastinal adenopathy. Top-normal right hilar lymph nodes, likely reactive. The esophagus and the thyroid gland are grossly unremarkable. No mediastinal fluid collection. Lungs/Pleura: Bibasilar atelectatic changes, left greater right. Pneumonia is not excluded. There is no pleural effusion or pneumothorax. The central airways are patent. Upper Abdomen: Apparent fatty infiltration of the liver. The visualized upper abdomen is otherwise unremarkable. Musculoskeletal: No chest wall abnormality. No acute or significant osseous findings. Review of the MIP images confirms the above findings. IMPRESSION: 1. Limited evaluation of the pulmonary arteries due to streak artifact and suboptimal opacification of the peripheral branches. No central or lobar pulmonary artery embolus identified. 2. Subsegmental bibasilar atelectatic changes, left greater right. Pneumonia is less likely. Clinical correlation is recommend. Electronically Signed   By: Anner Crete M.D.   On: 10/29/2016 02:46   US Venous Img Lower Bilateral  Result Date: 10/28/2016 CLINICAL DATA:  Initial evaluation for acute bilateral lower extremity pain. EXAM: BILATERAL LOWER EXTREMITY VENOUS DOPPLER ULTRASOUND TECHNIQUE: Gray-scale sonography with graded compression, as well as color Doppler and duplex ultrasound were performed to evaluate the lower extremity deep venous systems from the level of the common femoral vein and including the common femoral, femoral, profunda femoral, popliteal and  calf veins including the posterior tibial, peroneal and gastrocnemius veins when visible. The superficial great saphenous vein was also interrogated. Spectral Doppler was utilized to evaluate flow at rest and with distal augmentation maneuvers in the common femoral, femoral and popliteal veins. COMPARISON:  None. FINDINGS: RIGHT LOWER EXTREMITY Common Femoral Vein: No evidence of thrombus. Normal compressibility, respiratory phasicity and response to augmentation. Saphenofemoral Junction: No evidence of thrombus. Normal compressibility and flow on color Doppler imaging. Profunda Femoral Vein: No evidence of thrombus. Normal compressibility and flow on color Doppler imaging. 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 Occlusive and nonocclusive thrombus within the right femoral vein extending inferiorly into the right popliteal vein. Loss of compressibility. Popliteal Vein: Occlusive thrombus within the right popliteal vein. Loss of compressibility. Calf Veins: Not visualized.  DVT not entirely excluded. Superficial Great Saphenous Vein: No evidence of thrombus. Normal compressibility and flow on color Doppler imaging. Other Findings: Mildly prominent right inguinal lymph node demonstrates normal architecture with normal fatty hilum. LEFT LOWER EXTREMITY Common Femoral Vein: No evidence of thrombus. Normal compressibility, respiratory phasicity and response to augmentation. Saphenofemoral Junction: No evidence of thrombus. Normal compressibility and flow on color Doppler imaging. Profunda Femoral Vein: No evidence of thrombus. Normal compressibility and flow on color Doppler imaging. Femoral Vein: No evidence of thrombus. Normal compressibility, respiratory phasicity and response to augmentation. Popliteal Vein: Occlusive thrombus within the distal left popliteal vein. Loss of compressibility. Calf Veins: Not visualized.  DVT not entirely excluded. Superficial Great Saphenous Vein: No evidence of thrombus. Normal  compressibility and flow on color Doppler imaging. Other Findings:  None. IMPRESSION: Positive study with occlusive DVT in the bilateral lower extremities, involving the femoral and popliteal veins on the right and the distal popliteal vein on the left. Critical Value/emergent results were called by telephone at the time of interpretation on 10/28/2016 at 9:54 pm to the nurse practitioner CARI TRIPLETT , who verbally acknowledged these results. Electronically Signed   By:  Jeannine Boga M.D.   On: 10/28/2016 21:58    EKG:   Orders placed or performed during the hospital encounter of 10/28/16  . EKG 12-Lead  . EKG 12-Lead  . EKG 12-Lead  . EKG 12-Lead  . EKG 12-Lead    ASSESSMENT AND PLAN:   61 year old male with past medical history significant for CLL, diabetes mellitus, right leg DVT status post thrombectomy and on eliquis from March 2018 presents to hospital secondary to bilateral leg swelling and pain, worse on the left side.  #1 bilateral extensive DVT-patient has been on eliquis since March 2018 after diagnosis of his extensive right leg DVT. -He is already status post IVC filter placement at that time. CT of the chest this admission is negative for any PE. -Now bilateral DVT noted. Swelling and pain are improved. -Hematology consult for anticoagulation failure. On IV heparin at this time. Might need to be discharged on Coumadin. -Vascular consult to see if patient will need any thrombolysis.  #2 uncontrolled diabetes mellitus- in March 2018, A1c of the time was 7.8. Only takes metformin at home. -Sugars are not adequately controlled. Hold metformin due to CT with contrast. -Started on Lantus and sliding scale insulin at this time. Recheck A1c  #3 CLL-has chronic leukocytosis. Monitor, follow up as outpatient  #4 EKG changes-appreciate cardiology consult. Noted to be early repolarization changes. No acute intervention needed at this time.  #5 acute renal  insufficiency-improved with fluids.    All the records are reviewed and case discussed with Care Management/Social Workerr. Management plans discussed with the patient, family and they are in agreement.  CODE STATUS: Full code  TOTAL TIME TAKING CARE OF THIS PATIENT: 38 minutes.   POSSIBLE D/C IN 1-2 DAYS, DEPENDING ON CLINICAL CONDITION.   Gladstone Lighter M.D on 10/29/2016 at 1:50 PM  Between 7am to 6pm - Pager - [phone]  After 6pm go to [email or web address] - password [email or web address] [facility]  Office  [phone]  CC: Primary care physician; [name], MD

## 2016-10-29 NOTE — Progress Notes (Signed)
ANTICOAGULATION CONSULT NOTE  Pharmacy Consult for heparin drip Indication: DVT  No Known Allergies  Patient Measurements: Height: 6\' 3"  (190.5 cm) Weight: 256 lb 9.6 oz (116.4 kg) IBW/kg (Calculated) : 84.5 Heparin Dosing Weight: 110.1 kg  Vital Signs: Temp: 97.5 F (36.4 C) (07/19 0441) Temp Source: Oral (07/19 0441) BP: 118/76 (07/19 0755) Pulse Rate: 61 (07/19 0755)  Labs:  Recent Labs  10/28/16 1924 10/28/16 2308 10/29/16 0121 10/29/16 0507 10/29/16 0958  HGB 14.2  --   --  14.1  --   HCT 44.9  --   --  42.5  --   PLT 118*  --   --  112*  --   APTT  --  26  --  43* 51*  LABPROT  --  13.5  --  14.3  --   INR  --  1.03  --  1.11  --   HEPARINUNFRC  --  0.59  --  0.57 0.58  CREATININE 1.25*  --   --  1.17  --   TROPONINI  --   --  <0.03  --   --     Estimated Creatinine Clearance: 94.7 mL/min (by C-G formula based on SCr of 1.17 mg/dL).   Medical History: Past Medical History:  Diagnosis Date  . Cancer ([employer])    CLL monitored   . Clotting disorder ([place])   . 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 insulin aspart  0-20 Units Subcutaneous Q4H    Assessment: Patient admitted w/ bilateral leg pain, found to have occlusive DVT being started on heparin drip. Patient takes apixaban 5mg  bid at home.  Goal of Therapy:  Heparin level 0.3-0.7 units/ml corresponding Target aPTT is 66-102s Monitor platelets by anticoagulation protocol: Yes   Plan:  Give 4000 units bolus x 1 Start heparin infusion at 1500 units/hr  Baseline labs ordered. Will order HL/aPTT @ 0500 since patient is on a DOAC If levels do not correlate will dose off of aPTT until both HL/aPTT correlated.  7/19 @ 0500 aPTT 43, HL 0.57. APTT subtherapeutic, will increase rate to 1700 units/hr and will recheck aPTT/HL @ 1100. Will dose off of HL once both aPTT/ HL correlate. 7/19@ 1130 aPTT 51, HL 0.58, APTT subtherapeutic, will increase rate to 1900  units/hr and will recheck aPTT/HL @ 1800. Will dose off of HL once both aPTT/ HL correlate.  Thomasenia Sales, PharmD, MBA, [place] Pharmacist [facility]    10/29/2016

## 2016-10-29 NOTE — Progress Notes (Signed)
ANTICOAGULATION CONSULT NOTE - Initial Consult  Pharmacy Consult for heparin drip Indication: DVT  No Known Allergies  Patient Measurements: Height: 6\' 3"  (190.5 cm) Weight: 256 lb 9.6 oz (116.4 kg) IBW/kg (Calculated) : 84.5 Heparin Dosing Weight: 110.1 kg  Vital Signs: Temp: 97.5 F (36.4 C) (07/19 0441) Temp Source: Oral (07/19 0441) BP: 117/73 (07/19 0441) Pulse Rate: 58 (07/19 0441)  Labs:  Recent Labs  10/28/16 1924 10/28/16 2308 10/29/16 0121 10/29/16 0507  HGB 14.2  --   --  14.1  HCT 44.9  --   --  42.5  PLT 118*  --   --  112*  APTT  --  26  --  43*  LABPROT  --  13.5  --  14.3  INR  --  1.03  --  1.11  HEPARINUNFRC  --  0.59  --  0.57  CREATININE 1.25*  --   --  1.17  TROPONINI  --   --  <0.03  --     Estimated Creatinine Clearance: 94.7 mL/min (by C-G formula based on SCr of 1.17 mg/dL).   Medical History: Past Medical History:  Diagnosis Date  . Cancer ([place])    CLL monitored   . Clotting disorder ([place])   . 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 insulin aspart  0-20 Units Subcutaneous Q4H  . insulin aspart  0-5 Units Subcutaneous QHS  . insulin aspart  0-9 Units Subcutaneous TID WC    Assessment: Patient admitted w/ bilateral leg pain, found to have occlusive DVT being started on heparin drip. Patient takes apixaban 5mg  bid at home.  Goal of Therapy:  Heparin level 0.3-0.7 units/ml Monitor platelets by anticoagulation protocol: Yes   Plan:  Give 4000 units bolus x 1 Start heparin infusion at 1500 units/hr  Baseline labs ordered. Will order HL/aPTT @ 0500 since patient is on a DOAC If levels do not correlate will dose off of aPTT until both HL/aPTT correlated.  7/19 @ 0500 aPTT 43, HL 0.57. APTT subtherapeutic, will increase rate to 1700 units/hr and will recheck aPTT/HL @ 1100. Will dose off of HL once both aPTT/ HL correlate.  Tobie Lords, PharmD, BCPS Clinical  Pharmacist 10/29/2016

## 2016-10-29 NOTE — Progress Notes (Signed)
*  PRELIMINARY RESULTS* Echocardiogram 2D Echocardiogram has been performed.  Sherrie Sport 10/29/2016, 2:31 PM

## 2016-10-29 NOTE — ED Notes (Signed)
Dr. Joseph Art at bedside discussing with patient the reason to get a CT scan.

## 2016-10-30 ENCOUNTER — Encounter
Admission: EM | Disposition: A | Discharge status: Home / Self Care | Payer: Self-pay | Source: Home / Self Care | Attending: Internal Medicine

## 2016-10-30 ENCOUNTER — Encounter: Payer: Self-pay | Admitting: *Deleted

## 2016-10-30 DIAGNOSIS — Z86718 Personal history of other venous thrombosis and embolism: Secondary | ICD-10-CM

## 2016-10-30 DIAGNOSIS — Z794 Long term (current) use of insulin: Secondary | ICD-10-CM

## 2016-10-30 DIAGNOSIS — Z7901 Long term (current) use of anticoagulants: Secondary | ICD-10-CM

## 2016-10-30 DIAGNOSIS — M79605 Pain in left leg: Secondary | ICD-10-CM

## 2016-10-30 DIAGNOSIS — I82403 Acute embolism and thrombosis of unspecified deep veins of lower extremity, bilateral: Secondary | ICD-10-CM

## 2016-10-30 DIAGNOSIS — M7989 Other specified soft tissue disorders: Secondary | ICD-10-CM

## 2016-10-30 DIAGNOSIS — Z79899 Other long term (current) drug therapy: Secondary | ICD-10-CM

## 2016-10-30 DIAGNOSIS — C9110 Chronic lymphocytic leukemia of B-cell type not having achieved remission: Secondary | ICD-10-CM

## 2016-10-30 DIAGNOSIS — Z809 Family history of malignant neoplasm, unspecified: Secondary | ICD-10-CM

## 2016-10-30 DIAGNOSIS — D72829 Elevated white blood cell count, unspecified: Secondary | ICD-10-CM

## 2016-10-30 HISTORY — PX: PERIPHERAL VASCULAR THROMBECTOMY: CATH118306

## 2016-10-30 LAB — CBC
HEMATOCRIT: 40.3 % (ref 40.0–52.0)
HEMOGLOBIN: 13.2 g/dL (ref 13.0–18.0)
MCH: 28.5 pg (ref 26.0–34.0)
MCHC: 32.7 g/dL (ref 32.0–36.0)
MCV: 87.3 fL (ref 80.0–100.0)
Platelets: 112 10*3/uL — ABNORMAL LOW (ref 150–440)
RBC: 4.61 MIL/uL (ref 4.40–5.90)
RDW: 15.9 % — ABNORMAL HIGH (ref 11.5–14.5)
WBC: 89.3 10*3/uL (ref 3.8–10.6)

## 2016-10-30 LAB — BASIC METABOLIC PANEL
Anion gap: 5 (ref 5–15)
BUN: 10 mg/dL (ref 6–20)
CHLORIDE: 110 mmol/L (ref 101–111)
CO2: 24 mmol/L (ref 22–32)
Calcium: 8.3 mg/dL — ABNORMAL LOW (ref 8.9–10.3)
Creatinine, Ser: 0.94 mg/dL (ref 0.61–1.24)
GFR calc Af Amer: >60 mL/min (ref >60)
GFR calc non Af Amer: >60 mL/min (ref >60)
GLUCOSE: 148 mg/dL — AB (ref 65–99)
POTASSIUM: 3.7 mmol/L (ref 3.5–5.1)
Sodium: 139 mmol/L (ref 135–145)

## 2016-10-30 LAB — GLUCOSE, CAPILLARY
GLUCOSE-CAPILLARY: 120 mg/dL — AB (ref 65–99)
Glucose-Capillary: 134 mg/dL — ABNORMAL HIGH (ref 65–99)
Glucose-Capillary: 161 mg/dL — ABNORMAL HIGH (ref 65–99)
Glucose-Capillary: 178 mg/dL — ABNORMAL HIGH (ref 65–99)
Glucose-Capillary: 208 mg/dL — ABNORMAL HIGH (ref 65–99)
Glucose-Capillary: 258 mg/dL — ABNORMAL HIGH (ref 65–99)
Glucose-Capillary: 316 mg/dL — ABNORMAL HIGH (ref 65–99)

## 2016-10-30 LAB — PROTIME-INR
INR: 1.04
PROTHROMBIN TIME: 13.6 s (ref 11.4–15.2)

## 2016-10-30 LAB — HEPARIN LEVEL (UNFRACTIONATED): Heparin Unfractionated: 0.67 IU/mL (ref 0.30–0.70)

## 2016-10-30 LAB — APTT
APTT: 84 s — AB (ref 24–36)
APTT: 96 s — AB (ref 24–36)

## 2016-10-30 SURGERY — PERIPHERAL VASCULAR THROMBECTOMY
Anesthesia: Moderate Sedation | Laterality: Bilateral

## 2016-10-30 MED ORDER — FENTANYL CITRATE (PF) 100 MCG/2ML IJ SOLN
INTRAMUSCULAR | Status: DC | PRN
  Administered 2016-10-30: 25 ug via INTRAVENOUS
  Administered 2016-10-30: 50 ug via INTRAVENOUS
  Administered 2016-10-30: 25 ug via INTRAVENOUS

## 2016-10-30 MED ORDER — HEPARIN (PORCINE) IN NACL 2-0.9 UNIT/ML-% IJ SOLN
INTRAMUSCULAR | Status: AC
  Filled 2016-10-30: qty 1000

## 2016-10-30 MED ORDER — WARFARIN - PHARMACIST DOSING INPATIENT
Freq: Every day | Status: DC
  Administered 2016-10-30 – 2016-11-02 (×3)

## 2016-10-30 MED ORDER — WARFARIN SODIUM 10 MG PO TABS
10.0000 mg | ORAL_TABLET | Freq: Once | ORAL | Status: AC
  Administered 2016-10-30: 10 mg via ORAL
  Filled 2016-10-30: qty 1

## 2016-10-30 MED ORDER — HEPARIN SODIUM (PORCINE) 1000 UNIT/ML IJ SOLN
INTRAMUSCULAR | Status: DC | PRN
  Administered 2016-10-30: 3000 [IU] via INTRAVENOUS

## 2016-10-30 MED ORDER — HEPARIN SODIUM (PORCINE) 1000 UNIT/ML IJ SOLN
INTRAMUSCULAR | Status: AC
  Filled 2016-10-30: qty 1

## 2016-10-30 MED ORDER — LIVING WELL WITH DIABETES BOOK
Freq: Once | Status: AC
  Administered 2016-10-30: 17:00:00
  Filled 2016-10-30: qty 1

## 2016-10-30 MED ORDER — LIDOCAINE-EPINEPHRINE (PF) 2 %-1:200000 IJ SOLN
INTRAMUSCULAR | Status: AC
  Filled 2016-10-30: qty 20

## 2016-10-30 MED ORDER — MIDAZOLAM HCL 5 MG/5ML IJ SOLN
INTRAMUSCULAR | Status: AC
  Filled 2016-10-30: qty 5

## 2016-10-30 MED ORDER — FENTANYL CITRATE (PF) 100 MCG/2ML IJ SOLN
INTRAMUSCULAR | Status: AC
  Filled 2016-10-30: qty 2

## 2016-10-30 MED ORDER — MIDAZOLAM HCL 2 MG/2ML IJ SOLN
INTRAMUSCULAR | Status: DC | PRN
  Administered 2016-10-30: 1 mg via INTRAVENOUS
  Administered 2016-10-30: 2 mg via INTRAVENOUS
  Administered 2016-10-30 (×2): 1 mg via INTRAVENOUS

## 2016-10-30 MED ORDER — ALTEPLASE 2 MG IJ SOLR
INTRAMUSCULAR | Status: DC | PRN
  Administered 2016-10-30: 2 mg

## 2016-10-30 SURGICAL SUPPLY — 18 items
BALLN ULTRVRSE 12X80X75 (BALLOONS) ×3
BALLN ULTRVRSE 6X250X130 (BALLOONS) ×3
BALLOON ULTRVRSE 12X80X75 (BALLOONS) ×1 IMPLANT
BALLOON ULTRVRSE 6X250X130 (BALLOONS) ×1 IMPLANT
CANISTER PENUMBRA MAX (MISCELLANEOUS) ×3 IMPLANT
CANNULA 5F STIFF (CANNULA) ×3 IMPLANT
CATH BEACON 5 .035 65 KMP TIP (CATHETERS) ×3 IMPLANT
CATH INDIGO D 50CM (CATHETERS) ×3 IMPLANT
DEVICE PRESTO INFLATION (MISCELLANEOUS) ×3 IMPLANT
GLIDEWIRE ADV .035X180CM (WIRE) ×3 IMPLANT
PACK ANGIOGRAPHY (CUSTOM PROCEDURE TRAY) ×3 IMPLANT
SHEATH BRITE TIP 8FRX11 (SHEATH) ×6 IMPLANT
STENT LIFESTAR 14X60 (Permanent Stent) ×3 IMPLANT
SYR MEDRAD MARK V 150ML (SYRINGE) ×3 IMPLANT
TUBING CONTRAST HIGH PRESS 72 (TUBING) ×3 IMPLANT
VALVE HEMO TOUHY BORST Y (VALVE) ×3 IMPLANT
WIRE J 3MM .035X145CM (WIRE) ×3 IMPLANT
WIRE MAGIC TORQUE 260C (WIRE) ×6 IMPLANT

## 2016-10-30 NOTE — Plan of Care (Signed)
Problem: Pain Managment: Goal: General experience of comfort will improve Outcome: Not Progressing Patient still c/o bilateral leg pain. Medicated for this pain earlier in shift. Wenda Low [facility]

## 2016-10-30 NOTE — Progress Notes (Signed)
[place] for heparin drip and warfarin initial  Indication: DVT  No Known Allergies  Patient Measurements: Height: 6\' 3"  (190.5 cm) Weight: 256 lb (116.1 kg) IBW/kg (Calculated) : 84.5 Heparin Dosing Weight: 110.1 kg  Vital Signs: Temp: 98.3 F (36.8 C) (07/20 1400) Temp Source: Oral (07/20 1400) BP: 124/90 (07/20 1400) Pulse Rate: 56 (07/20 1400)  Labs:  Recent Labs  10/28/16 1924  10/28/16 2308 10/29/16 0121 10/29/16 0507 10/29/16 0958 10/29/16 1909 10/30/16 0313 10/30/16 0901  HGB 14.2  --   --   --  14.1  --   --  13.2  --   HCT 44.9  --   --   --  42.5  --   --  40.3  --   PLT 118*  --   --   --  112*  --   --  112*  --   APTT  --   < > 26  --  43* 51* 60* 84* 96*  LABPROT  --   --  13.5  --  14.3  --   --   --  13.6  INR  --   --  1.03  --  1.11  --   --   --  1.04  HEPARINUNFRC  --   < > 0.59  --  0.57 0.58 0.44  --  0.67  CREATININE 1.25*  --   --   --  1.17  --   --  0.94  --   TROPONINI  --   --   --  <0.03  --   --   --   --   --   < > = values in this interval not displayed.  Estimated Creatinine Clearance: 117.6 mL/min (by C-G formula based on SCr of 0.94 mg/dL). Lab Results  Component Value Date   INR 1.04 10/30/2016   INR 1.11 10/29/2016   INR 1.03 10/28/2016     Medical History: Past Medical History:  Diagnosis Date  . Cancer ([place])    CLL monitored   . Clotting disorder ([place])   . 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 [MAR Hold] insulin aspart  0-20 Units Subcutaneous Q4H  . [MAR Hold] insulin glargine  12 Units Subcutaneous QHS  . [MAR Hold] living well with diabetes book   Does not apply Once  . warfarin  10 mg Oral ONCE-1800  . Warfarin - Pharmacist Dosing Inpatient   Does not apply q1800    Assessment: Patient admitted w/ bilateral leg pain, found to have occlusive DVT being started on heparin drip. Patient takes apixaban 5mg  bid at  home.  Goal of Therapy:  Heparin level 0.3-0.7 units/ml corresponding Target aPTT is 66-102s Monitor platelets by anticoagulation protocol: Yes   Plan:  7/19 @ 1900 aPTT 60, HL 0.44 subtherapeutic. Levels do not correlate. Will increase rate to 2100 units/hr. Recheck APTT in 6 hours  7/20 @ 0313 aPTT 84 therapeutic. No HL was collected. Will continue current rate and will recheck both aPTT/HL @ 0900. If levels correlate, will dose off of HL.   7/20 @ 0901 aPTT 96 therapeutic, HL 0.67. Both aPTT/HL correlate, will use HL for monitoring moving forward. Continue current rate of 2100 units/hr (23ml/hr). HL order for 7/21 @ 0500 AM labs   7/20@1420  INR 1.04, warfarin 10mg  po x 1 dose at 1800. Goal INR 2.0 - 3.0, INR followed daily  by pharmacy until pt therapeutic. Hgb/HCT 13.2/40.3 Plt 112 from 10/29/16  Thomasenia Sales, PharmD, MBA, [facility]     10/30/2016

## 2016-10-30 NOTE — Consult Note (Signed)
[place]  Telephone:([phone] [phone]  ID: Anthony Hart. OB: 06/01/1955  MR#: [id number]  KXF#:[id number]  Patient Care Team: [name], MD as PCP - General (Family Medicine)  CHIEF COMPLAINT: CLL, bilateral DVT and possible Eliquis failure.  INTERVAL HISTORY: Patient is a 61 year old male with known history of untreated CLL who presented to emergency room with painful and swollen left leg. 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 He also has an  IVC filter in place. He continues to have left leg pain, but states it has mildly improved since admission. He has no neurologic complaints. He denies any fevers, night sweats, or weight loss. He has no chest pain or shortness of breath. He denies any nausea, vomiting, constipation, or diarrhea. He has no urinary complaints. Patient otherwise feels well and offers no further specific complaints.  REVIEW OF SYSTEMS:   Review of Systems  Constitutional: Negative.  Negative for fever, malaise/fatigue and weight loss.  Respiratory: Negative.  Negative for cough, hemoptysis and shortness of breath.   Cardiovascular: Positive for leg swelling. Negative for chest pain.  Gastrointestinal: Negative.  Negative for abdominal pain.  Genitourinary: Negative.   Musculoskeletal: Negative.   Skin: Negative.  Negative for rash.  Neurological: Negative.  Negative for sensory change and weakness.  Psychiatric/Behavioral: Negative.  The patient is not nervous/anxious.     As per HPI. Otherwise, a complete review of systems is negative.  PAST MEDICAL HISTORY: Past Medical History:  Diagnosis Date  . Cancer ([place])    CLL monitored   . Clotting disorder ([place])   . Diabetes mellitus without complication ([place])    Type II    PAST SURGICAL HISTORY: Past Surgical History:  Procedure Laterality Date  . APPENDECTOMY    . COLONOSCOPY WITH PROPOFOL  02/04/2015   Procedure: COLONOSCOPY WITH PROPOFOL;  Surgeon:  [name], MD;  Location: [facility] ENDOSCOPY;  Service: Gastroenterology;;  . ESOPHAGOGASTRODUODENOSCOPY (EGD) WITH PROPOFOL N/A 02/04/2015   Procedure: ESOPHAGOGASTRODUODENOSCOPY (EGD) WITH PROPOFOL;  Surgeon: [name], MD;  Location: [facility] ENDOSCOPY;  Service: Gastroenterology;  Laterality: N/A;  . PERIPHERAL VASCULAR THROMBECTOMY Right 07/02/2016   Procedure: Peripheral Vascular Thrombectomy and IVC filter;  Surgeon: [name], MD;  Location: [facility] CV LAB;  Service: Cardiovascular;  Laterality: Right;    FAMILY HISTORY: Family History  Problem Relation Age of Onset  . Diabetes Mother   . Cancer Brother   . Cancer Maternal Aunt   . Stroke Maternal Uncle     ADVANCED DIRECTIVES (Y/N):  @ADVDIR @  HEALTH MAINTENANCE: Social History  Substance Use Topics  . Smoking status: Never Smoker  . Smokeless tobacco: Never Used  . Alcohol use No     Colonoscopy:  PAP:  Bone density:  Lipid panel:  No Known Allergies  Current Facility-Administered Medications  Medication Dose Route Frequency Provider Last Rate Last Dose  . 0.9 %  sodium chloride infusion   Intravenous Continuous Hugelmeyer, Alexis, DO 75 mL/hr at 10/30/16 0553    . 0.9 %  sodium chloride infusion   Intravenous Continuous Dew, Erskine Squibb, MD      . acetaminophen (TYLENOL) tablet 650 mg  650 mg Oral Q6H PRN Hugelmeyer, Alexis, DO       Or  . acetaminophen (TYLENOL) suppository 650 mg  650 mg Rectal Q6H PRN Hugelmeyer, Alexis, DO      . albuterol (PROVENTIL) (2.5 MG/3ML) 0.083% nebulizer solution 2.5 mg  2.5 mg Nebulization Q6H  PRN Hugelmeyer, Alexis, DO      . bisacodyl (DULCOLAX) EC tablet 5 mg  5 mg Oral Daily PRN Hugelmeyer, Alexis, DO      . cefUROXime (ZINACEF) 1.5 g in dextrose 5 % 50 mL IVPB  1.5 g Intravenous 30 min Pre-Op [name], MD      . heparin ADULT infusion 100 units/mL (25000 units/232mL sodium chloride 0.45%)  2,100 Units/hr Intravenous Continuous Ramond Dial, RPH 21 mL/hr at 10/30/16 0552  2,100 Units/hr at 10/30/16 0552  . insulin aspart (novoLOG) injection 0-20 Units  0-20 Units Subcutaneous Q4H Hugelmeyer, Alexis, DO   11 Units at 10/30/16 0040  . insulin glargine (LANTUS) injection 12 Units  12 Units Subcutaneous QHS Gladstone Lighter, MD   12 Units at 10/29/16 2245  . ipratropium (ATROVENT) nebulizer solution 0.5 mg  0.5 mg Nebulization Q6H PRN Hugelmeyer, Alexis, DO      . magnesium citrate solution 1 Bottle  1 Bottle Oral Once PRN Hugelmeyer, Alexis, DO      . ondansetron (ZOFRAN) tablet 4 mg  4 mg Oral Q6H PRN Hugelmeyer, Alexis, DO       Or  . ondansetron (ZOFRAN) injection 4 mg  4 mg Intravenous Q6H PRN Hugelmeyer, Alexis, DO      . oxyCODONE (Oxy IR/ROXICODONE) immediate release tablet 5 mg  5 mg Oral Q4H PRN Hugelmeyer, Alexis, DO   5 mg at 10/29/16 2251  . senna-docusate (Senokot-S) tablet 1 tablet  1 tablet Oral QHS PRN Hugelmeyer, Alexis, DO      . zolpidem (AMBIEN) tablet 5 mg  5 mg Oral QHS PRN,MR X 1 Hugelmeyer, Alexis, DO        OBJECTIVE: Vitals:   10/29/16 1935 10/30/16 0429  BP: 111/75 108/68  Pulse: 72 62  Resp: 18 18  Temp: 97.9 F (36.6 C) 98.5 F (36.9 C)     Body mass index is 32.07 kg/m.    ECOG FS:0 - Asymptomatic  General: Well-developed, well-nourished, no acute distress. Eyes: Pink conjunctiva, anicteric sclera. HEENT: Normocephalic, moist mucous membranes, clear oropharnyx. Lungs: Clear to auscultation bilaterally. Heart: Regular rate and rhythm. No rubs, murmurs, or gallops. Abdomen: Soft, nontender, nondistended. No organomegaly noted, normoactive bowel sounds. Musculoskeletal: No edema, cyanosis, or clubbing. Neuro: Alert, answering all questions appropriately. Cranial nerves grossly intact. Skin: Left leg edema and erythema.  Psych: Normal affect. Lymphatics: No cervical, calvicular, axillary or inguinal LAD.   LAB RESULTS:  Lab Results  Component Value Date   NA 139 10/30/2016   K 3.7 10/30/2016   CL 110 10/30/2016    CO2 24 10/30/2016   GLUCOSE 148 (H) 10/30/2016   BUN 10 10/30/2016   CREATININE 0.94 10/30/2016   CALCIUM 8.3 (L) 10/30/2016   PROT 6.6 03/28/2015   ALBUMIN 4.0 03/28/2015   AST 28 03/28/2015   ALT 28 03/28/2015   ALKPHOS 60 03/28/2015   BILITOT 0.5 03/28/2015   GFRNONAA >60 10/30/2016   GFRAA >60 10/30/2016    Lab Results  Component Value Date   WBC 89.3 (HH) 10/30/2016   NEUTROABS 4.4 11/15/2014   HGB 13.2 10/30/2016   HCT 40.3 10/30/2016   MCV 87.3 10/30/2016   PLT 112 (L) 10/30/2016     STUDIES: Dg Chest 1 View  Result Date: 10/29/2016 CLINICAL DATA:  61 year old male with history of CLL.  No symptoms. EXAM: CHEST 1 VIEW COMPARISON:  Chest CT dated 07/01/2016 FINDINGS: The heart size and mediastinal contours are within normal limits. Both lungs are clear.  The visualized skeletal structures are unremarkable. IMPRESSION: No active disease. Electronically Signed   By: Anner Crete M.D.   On: 10/29/2016 01:29   Ct Angio Chest Pe W And/or Wo Contrast  Result Date: 10/29/2016 CLINICAL DATA:  61 year old male with bilateral lower extremity tenderness and pain. Concern for PE. EXAM: CT ANGIOGRAPHY CHEST WITH CONTRAST TECHNIQUE: Multidetector CT imaging of the chest was performed using the standard protocol during bolus administration of intravenous contrast. Multiplanar CT image reconstructions and MIPs were obtained to evaluate the vascular anatomy. CONTRAST:  100 cc Isovue 370 COMPARISON:  Chest radiograph dated 10/29/2016 FINDINGS: Cardiovascular: Top-normal cardiac size. No pericardial effusion. The thoracic aorta is unremarkable. The origins of the great vessels of the aortic arch appear patent as visualized. Evaluation of the pulmonary arteries is limited due to streak artifact caused by patient's arms and suboptimal opacification of the peripheral branches. No central or lobar pulmonary artery emboli identified. Mediastinum/Nodes: There is no hilar or mediastinal adenopathy.  Top-normal right hilar lymph nodes, likely reactive. The esophagus and the thyroid gland are grossly unremarkable. No mediastinal fluid collection. Lungs/Pleura: Bibasilar atelectatic changes, left greater right. Pneumonia is not excluded. There is no pleural effusion or pneumothorax. The central airways are patent. Upper Abdomen: Apparent fatty infiltration of the liver. The visualized upper abdomen is otherwise unremarkable. Musculoskeletal: No chest wall abnormality. No acute or significant osseous findings. Review of the MIP images confirms the above findings. IMPRESSION: 1. Limited evaluation of the pulmonary arteries due to streak artifact and suboptimal opacification of the peripheral branches. No central or lobar pulmonary artery embolus identified. 2. Subsegmental bibasilar atelectatic changes, left greater right. Pneumonia is less likely. Clinical correlation is recommend. Electronically Signed   By: Anner Crete M.D.   On: 10/29/2016 02:46   US Venous Img Lower Bilateral  Result Date: 10/28/2016 CLINICAL DATA:  Initial evaluation for acute bilateral lower extremity pain. EXAM: BILATERAL LOWER EXTREMITY VENOUS DOPPLER ULTRASOUND TECHNIQUE: Gray-scale sonography with graded compression, as well as color Doppler and duplex ultrasound were performed to evaluate the lower extremity deep venous systems from the level of the common femoral vein and including the common femoral, femoral, profunda femoral, popliteal and calf veins including the posterior tibial, peroneal and gastrocnemius veins when visible. The superficial great saphenous vein was also interrogated. Spectral Doppler was utilized to evaluate flow at rest and with distal augmentation maneuvers in the common femoral, femoral and popliteal veins. COMPARISON:  None. FINDINGS: RIGHT LOWER EXTREMITY Common Femoral Vein: No evidence of thrombus. Normal compressibility, respiratory phasicity and response to augmentation. Saphenofemoral Junction:  No evidence of thrombus. Normal compressibility and flow on color Doppler imaging. Profunda Femoral Vein: No evidence of thrombus. Normal compressibility and flow on color Doppler imaging. 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 Occlusive and nonocclusive thrombus within the right femoral vein extending inferiorly into the right popliteal vein. Loss of compressibility. Popliteal Vein: Occlusive thrombus within the right popliteal vein. Loss of compressibility. Calf Veins: Not visualized.  DVT not entirely excluded. Superficial Great Saphenous Vein: No evidence of thrombus. Normal compressibility and flow on color Doppler imaging. Other Findings: Mildly prominent right inguinal lymph node demonstrates normal architecture with normal fatty hilum. LEFT LOWER EXTREMITY Common Femoral Vein: No evidence of thrombus. Normal compressibility, respiratory phasicity and response to augmentation. Saphenofemoral Junction: No evidence of thrombus. Normal compressibility and flow on color Doppler imaging. Profunda Femoral Vein: No evidence of thrombus. Normal compressibility and flow on color Doppler imaging. Femoral Vein: No evidence of thrombus.  Normal compressibility, respiratory phasicity and response to augmentation. Popliteal Vein: Occlusive thrombus within the distal left popliteal vein. Loss of compressibility. Calf Veins: Not visualized.  DVT not entirely excluded. Superficial Great Saphenous Vein: No evidence of thrombus. Normal compressibility and flow on color Doppler imaging. Other Findings:  None. IMPRESSION: Positive study with occlusive DVT in the bilateral lower extremities, involving the femoral and popliteal veins on the right and the distal popliteal vein on the left. Critical Value/emergent results were called by telephone at the time of interpretation on 10/28/2016 at 9:54 pm to the nurse practitioner CARI TRIPLETT , who verbally acknowledged these results. Electronically Signed   By: Jeannine Boga M.D.   On: 10/28/2016 21:58    ASSESSMENT: CLL, bilateral DVT and possible Eliquis failure.  PLAN:    1. CLL: Possibly contributing to his hypercoagulable state. Patient's white count is elevated, but not significantly increased from over 3 months ago. We will get CT of the abdomen and pelvis to further evaluate for abdominal lymphadenopathy which possibly could be contributing to his lower extremity DVTs. Given his worsening hypercoagulable state, would consider treatment at this time. 2. Recurrent DVT: Patient is adamant that he has not missed any doses of Eliquis, therefore I suspect this is a true Eliquis failure unless he has evidence of obstructive lymphadenopathy on abdominal and pelvic CT which has been ordered. Patient is currently on heparin drip, and recommended discharging on Coumadin maintaining an INR of 2.0-3.0. Appreciate vascular surgery input. 3. Disposition: Please insure patient has follow-up in the [facility] approximately one week after discharge for further evaluation, laboratory work, and treatment planning if necessary.  Appreciate consult, will follow.   [name], MD   10/30/2016 7:58 AM

## 2016-10-30 NOTE — Progress Notes (Signed)
[facility] [place] NAME: Anthony Hart    MR#:  [id number]  DATE OF BIRTH:  [birth year]  SUBJECTIVE:  CHIEF COMPLAINT:   Chief Complaint  Patient presents with  . Leg Pain  . Weakness   - symptomatic bilateral lower extremity DVT. Patient going for thrombolysis of his left leg today -CT of the abdomen scheduled for this afternoon. Plan to start Coumadin after the procedure  REVIEW OF SYSTEMS:  Review of Systems  Constitutional: Negative for chills, fever and malaise/fatigue.  HENT: Negative for ear discharge, hearing loss and nosebleeds.   Eyes: Negative for blurred vision and double vision.  Respiratory: Negative for cough, shortness of breath and wheezing.   Cardiovascular: Positive for leg swelling. Negative for chest pain and palpitations.  Gastrointestinal: Negative for abdominal pain, constipation, diarrhea, nausea and vomiting.  Genitourinary: Negative for dysuria.  Musculoskeletal: Positive for myalgias.  Neurological: Negative for dizziness, speech change, focal weakness, seizures and headaches.  Psychiatric/Behavioral: Negative for depression.    DRUG ALLERGIES:  No Known Allergies  VITALS:  Blood pressure 108/68, pulse 62, temperature 98.5 F (36.9 C), temperature source Oral, resp. rate 18, height 6\' 3"  (1.905 m), weight 116.4 kg (256 lb 9.6 oz), SpO2 94 %.  PHYSICAL EXAMINATION:  Physical Exam  GENERAL:  61 y.o.-year-old patient lying in the bed with no acute distress.  EYES: Pupils equal, round, reactive to light and accommodation. No scleral icterus. Extraocular muscles intact.  HEENT: Head atraumatic, normocephalic. Oropharynx and nasopharynx clear.  NECK:  Supple, no jugular venous distention. No thyroid enlargement, no tenderness.  LUNGS: Normal breath sounds bilaterally, no wheezing, rales,rhonchi or crepitation. No use of accessory muscles of respiration.  CARDIOVASCULAR: S1, S2 normal. No murmurs, rubs, or  gallops.  ABDOMEN: Soft, nontender, nondistended. Bowel sounds present. No organomegaly or mass.  EXTREMITIES: No pedal edema, cyanosis, or clubbing. Minimal calf tenderness noted on the left leg NEUROLOGIC: Cranial nerves II through XII are intact. Muscle strength 5/5 in all extremities. Sensation intact. Gait not checked.  PSYCHIATRIC: The patient is alert and oriented x 3.  SKIN: No obvious rash, lesion, or ulcer.    LABORATORY PANEL:   CBC  Recent Labs Lab 10/30/16 0313  WBC 89.3*  HGB 13.2  HCT 40.3  PLT 112*   ------------------------------------------------------------------------------------------------------------------  Chemistries   Recent Labs Lab 10/30/16 0313  NA 139  K 3.7  CL 110  CO2 24  GLUCOSE 148*  BUN 10  CREATININE 0.94  CALCIUM 8.3*   ------------------------------------------------------------------------------------------------------------------  Cardiac Enzymes  Recent Labs Lab 10/29/16 0121  TROPONINI <0.03   ------------------------------------------------------------------------------------------------------------------  RADIOLOGY:  Dg Chest 1 View  Result Date: 10/29/2016 CLINICAL DATA:  61 year old male with history of CLL.  No symptoms. EXAM: CHEST 1 VIEW COMPARISON:  Chest CT dated 07/01/2016 FINDINGS: The heart size and mediastinal contours are within normal limits. Both lungs are clear. The visualized skeletal structures are unremarkable. IMPRESSION: No active disease. Electronically Signed   By: Anner Crete M.D.   On: 10/29/2016 01:29   Ct Angio Chest Pe W And/or Wo Contrast  Result Date: 10/29/2016 CLINICAL DATA:  61 year old male with bilateral lower extremity tenderness and pain. Concern for PE. EXAM: CT ANGIOGRAPHY CHEST WITH CONTRAST TECHNIQUE: Multidetector CT imaging of the chest was performed using the standard protocol during bolus administration of intravenous contrast. Multiplanar CT image reconstructions and  MIPs were obtained to evaluate the vascular anatomy. CONTRAST:  100 cc Isovue 370 COMPARISON:  Chest  radiograph dated 10/29/2016 FINDINGS: Cardiovascular: Top-normal cardiac size. No pericardial effusion. The thoracic aorta is unremarkable. The origins of the great vessels of the aortic arch appear patent as visualized. Evaluation of the pulmonary arteries is limited due to streak artifact caused by patient's arms and suboptimal opacification of the peripheral branches. No central or lobar pulmonary artery emboli identified. Mediastinum/Nodes: There is no hilar or mediastinal adenopathy. Top-normal right hilar lymph nodes, likely reactive. The esophagus and the thyroid gland are grossly unremarkable. No mediastinal fluid collection. Lungs/Pleura: Bibasilar atelectatic changes, left greater right. Pneumonia is not excluded. There is no pleural effusion or pneumothorax. The central airways are patent. Upper Abdomen: Apparent fatty infiltration of the liver. The visualized upper abdomen is otherwise unremarkable. Musculoskeletal: No chest wall abnormality. No acute or significant osseous findings. Review of the MIP images confirms the above findings. IMPRESSION: 1. Limited evaluation of the pulmonary arteries due to streak artifact and suboptimal opacification of the peripheral branches. No central or lobar pulmonary artery embolus identified. 2. Subsegmental bibasilar atelectatic changes, left greater right. Pneumonia is less likely. Clinical correlation is recommend. Electronically Signed   By: Anner Crete M.D.   On: 10/29/2016 02:46   US Venous Img Lower Bilateral  Result Date: 10/28/2016 CLINICAL DATA:  Initial evaluation for acute bilateral lower extremity pain. EXAM: BILATERAL LOWER EXTREMITY VENOUS DOPPLER ULTRASOUND TECHNIQUE: Gray-scale sonography with graded compression, as well as color Doppler and duplex ultrasound were performed to evaluate the lower extremity deep venous systems from the level of  the common femoral vein and including the common femoral, femoral, profunda femoral, popliteal and calf veins including the posterior tibial, peroneal and gastrocnemius veins when visible. The superficial great saphenous vein was also interrogated. Spectral Doppler was utilized to evaluate flow at rest and with distal augmentation maneuvers in the common femoral, femoral and popliteal veins. COMPARISON:  None. FINDINGS: RIGHT LOWER EXTREMITY Common Femoral Vein: No evidence of thrombus. Normal compressibility, respiratory phasicity and response to augmentation. Saphenofemoral Junction: No evidence of thrombus. Normal compressibility and flow on color Doppler imaging. Profunda Femoral Vein: No evidence of thrombus. Normal compressibility and flow on color Doppler imaging. 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 Occlusive and nonocclusive thrombus within the right femoral vein extending inferiorly into the right popliteal vein. Loss of compressibility. Popliteal Vein: Occlusive thrombus within the right popliteal vein. Loss of compressibility. Calf Veins: Not visualized.  DVT not entirely excluded. Superficial Great Saphenous Vein: No evidence of thrombus. Normal compressibility and flow on color Doppler imaging. Other Findings: Mildly prominent right inguinal lymph node demonstrates normal architecture with normal fatty hilum. LEFT LOWER EXTREMITY Common Femoral Vein: No evidence of thrombus. Normal compressibility, respiratory phasicity and response to augmentation. Saphenofemoral Junction: No evidence of thrombus. Normal compressibility and flow on color Doppler imaging. Profunda Femoral Vein: No evidence of thrombus. Normal compressibility and flow on color Doppler imaging. Femoral Vein: No evidence of thrombus. Normal compressibility, respiratory phasicity and response to augmentation. Popliteal Vein: Occlusive thrombus within the distal left popliteal vein. Loss of compressibility. Calf Veins: Not  visualized.  DVT not entirely excluded. Superficial Great Saphenous Vein: No evidence of thrombus. Normal compressibility and flow on color Doppler imaging. Other Findings:  None. IMPRESSION: Positive study with occlusive DVT in the bilateral lower extremities, involving the femoral and popliteal veins on the right and the distal popliteal vein on the left. Critical Value/emergent results were called by telephone at the time of interpretation on 10/28/2016 at 9:54 pm to the nurse practitioner CARI  TRIPLETT , who verbally acknowledged these results. Electronically Signed   By: Jeannine Boga M.D.   On: 10/28/2016 21:58    EKG:   Orders placed or performed during the hospital encounter of 10/28/16  . EKG 12-Lead  . EKG 12-Lead  . EKG 12-Lead  . EKG 12-Lead  . EKG 12-Lead    ASSESSMENT AND PLAN:   61 year old male with past medical history significant for CLL, diabetes mellitus, right leg DVT status post thrombectomy and on eliquis from March 2018 presents to hospital secondary to bilateral leg swelling and pain, worse on the left side.  #1 bilateral extensive DVT-patient has been on eliquis since March 2018 after diagnosis of his extensive right leg DVT. States that he has been compliant. -He is already status post IVC filter placement at that time. CT of the chest this admission is negative for any PE. Concern for filter thrombus as well. -Now bilateral DVT noted. Appreciate vascular consult. Plan for thrombectomy and thrombolysis today especially on the left side. -Can be considered eliquis failure? - Will be started on Coumadin after the procedure today and bridging with IV heparin -appreciate hematology consult   #2 uncontrolled diabetes mellitus- in March 2018, A1c of the time was 7.8. Only takes metformin at home. -Sugars are not adequately controlled. Hold metformin due to CT with contrast. -Started on Lantus and sliding scale insulin at this time. Recheck A1c  #3 CLL-has  chronic leukocytosis. Monitor, follow up as outpatient -could be the reason for hypercoagulable state. WBC is elevated but not significantly when compared to outpatient labs. -Going for CT of the abdomen to evaluate for lymphadenopathy  #4 EKG changes-appreciate cardiology consult. Noted to be early repolarization changes. No acute intervention needed at this time.  #5 acute renal insufficiency-improved with fluids.    All the records are reviewed and case discussed with Care Management/Social Workerr. Management plans discussed with the patient, family and they are in agreement.  CODE STATUS: Full code  TOTAL TIME TAKING CARE OF THIS PATIENT: 33 minutes.   POSSIBLE D/C IN 1-2 DAYS once INR is therapeutic, DEPENDING ON CLINICAL CONDITION.   Elbert Spickler M.D on 10/30/2016 at 9:19 AM  Between 7am to 6pm - Pager - [phone]  After 6pm go to [email or web address] - password [email or web address] [facility]  Office  [phone]  CC: Primary care physician; [name], MD

## 2016-10-30 NOTE — Op Note (Signed)
[facility]   OPERATIVE NOTE   PRE-OPERATIVE DIAGNOSIS: extensive recurrent bilateral lower extremity DVT  POST-OPERATIVE DIAGNOSIS: same   PROCEDURE: 1. US guidance for vascular access to left popliteal vein and right popliteal vein 2. Catheter placement into right common iliac vein from right popliteal approach and left common iliac vein from left popliteal approach 3. IVC gram and bilateral lower extremity venogram 4.  Catheter directed thrombolysis with 8 mg of TPA to the right iliac vein, common femoral vein, superficial femoral vein, and popliteal vein 5. Mechanical thrombectomy to right superficial femoral vein and popliteal vein with the penumbra cat D device 6. PTA of right superficial femoral vein and popliteal vein with 6 mm diameter by 25 cm length angioplasty balloon 7. PTA of right common and external iliac veins with 12 mm by 8 cm length angioplasty balloon 8.   Self-expanding stent placement to the right iliac vein for greater than 50% residual stenosis with angioplasty using a 14 mm diameter by 6 cm length self-expanding stent   SURGEON: [name], MD  ASSISTANT(S): none  ANESTHESIA: local with moderate conscious sedation for 50 minutes using 5 mg of Versed and 100 mcg of Fentanyl  ESTIMATED BLOOD LOSS: 300 cc  FINDING(S): 1. No significant thrombus within the left lower extremity or left iliac veins on venogram. The left iliac vein was widely patent without significant stenosis. No intervention required in the LLE. There was somewhat chronic appearing thrombus in the right popliteal vein and superficial femoral vein which was seen in the more lateral of the 2 paired veins at the knee. By duplex this was felt to have a more acute component as well, so treatment was planned. The common femoral vein that appeared widely patent. The right external and common iliac veins had a recurrent stenosis in the 65-70% range. There may have been  a small amount of thrombus in this area but it was not significant. The IVC was widely patent and the IVC filter was widely patent in good position.  SPECIMEN(S): none  INDICATIONS:  Patient is a 61 y.o. male who presents with recurrent DVT seen on a duplex on admission 2 days ago. This time, he has left lower extremity DVT by duplex which was not present at his last admission and intervention about 4 months ago. He also has recurrent right lower extremity DVT. Patient has marked leg swelling and pain. Venous intervention is performed to reduce the symtpoms and avoid long term postphlebitic symptoms.   DESCRIPTION: After obtaining full informed written consent, the patient was brought back to the vascular suite and placed supine upon the table.Moderate conscious sedation was administered during a face to face encounter with the patient throughout the procedure with my supervision of the RN administering medicines and monitoring the patient's vital signs, pulse oximetry, telemetry and mental status throughout from the start of the procedure until the patient was taken to the recovery room. After obtaining adequate anesthesia, the patient was prepped and draped in the standard fashion. The patient was then placed into the prone position. The right popliteal vein was then accessed first under direct ultrasound guidance without difficulty with a micropuncture needle and a permanent image was recorded. I then upsized to an 8Fr sheath over a J wire. Similarly, the left popliteal vein was then accessed under direct ultrasound guidance without difficulty with a micropuncture needle and a permanent image was recorded. Although his preoperative duplex suggested left popliteal vein DVT, that was not well seen  today. This was accessed without difficulty and a micropuncture wire and sheath were then placed. I then upsized to an 8 [place] sheath over a J-wire. 3000 units of heparin were then given. A Kumpe  catheter and Advantage wire were then advanced into the CFV and external iliac veins on both sides to evaluate the iliac veins and IVC and images were performed. Findings were as follows.No significant thrombus within the left lower extremity or left iliac veins on venogram. The left iliac vein was widely patent without significant stenosis. No intervention required in the LLE. There was somewhat chronic appearing thrombus in the right popliteal vein and superficial femoral vein which was seen in the more lateral of the 2 paired veins at the knee. By duplex this was felt to have a more acute component as well, so treatment was planned. The common femoral vein then appeared widely patent. The right external and common iliac veins had a recurrent stenosis in the 65-70% range. There may have been a small amount of thrombus in this area but it was not significant. The IVC was widely patent and the IVC filter was widely patent in good position.  I was able to cross the thrombus and stenosis and advance into the right iliac vein first.The Kumpe catheter had a [employer] placed on it and instilled 8 mg of tpa throughout the right popliteal vein, SFV, and iliac veins.  After this dwelled for 15 minutes, I used the penumbra cat D device in the right popliteal vein and superficial femoral vein. 300 cc of effluent returned. This had mild improvement. I then treated the right popliteal vein and SFV with 6 mm diameter x 25 cm angioplasty balloon to open a channel. This resulted in significant resolution of the thrombus and narrowing and improved flow.The residual stenosis appeared to be <30%. I then turned my attention to the iliac veins. The stenosis was treated with a 12 mm diameter by 8 cm length angioplasty balloon.  This was inflated up to 8 atm for 1 minute. Completion venogram showed a greater than 50% residual stenosis. I then placed a 14 mm diameter by 6 cm length self expanding stent and postdilated this with a  12 mm balloon. There was only about a 20% residual stenosis after this with brisk flow. I then elected to terminate the procedure. The sheath was removed and a dressing was placed. She was taken to the recovery room in stable condition having tolerated the procedure well.   COMPLICATIONS: None  CONDITION: Stable  [name] 10/30/2016 3:24 PM

## 2016-10-30 NOTE — Progress Notes (Signed)
[facility] for heparin drip Indication: DVT  No Known Allergies  Patient Measurements: Height: 6\' 3"  (190.5 cm) Weight: 256 lb 9.6 oz (116.4 kg) IBW/kg (Calculated) : 84.5 Heparin Dosing Weight: 110.1 kg  Vital Signs: Temp: 97.9 F (36.6 C) (07/19 1935) Temp Source: Oral (07/19 1935) BP: 111/75 (07/19 1935) Pulse Rate: 72 (07/19 1935)  Labs:  Recent Labs  10/28/16 1924  10/28/16 2308 10/29/16 0121 10/29/16 0507 10/29/16 0958 10/29/16 1909 10/30/16 0313  HGB 14.2  --   --   --  14.1  --   --  13.2  HCT 44.9  --   --   --  42.5  --   --  40.3  PLT 118*  --   --   --  112*  --   --  112*  APTT  --   < > 26  --  43* 51* 60* 84*  LABPROT  --   --  13.5  --  14.3  --   --   --   INR  --   --  1.03  --  1.11  --   --   --   HEPARINUNFRC  --   < > 0.59  --  0.57 0.58 0.44  --   CREATININE 1.25*  --   --   --  1.17  --   --  0.94  TROPONINI  --   --   --  <0.03  --   --   --   --   < > = values in this interval not displayed.  Estimated Creatinine Clearance: 117.9 mL/min (by C-G formula based on SCr of 0.94 mg/dL).   Medical History: Past Medical History:  Diagnosis Date  . Cancer ([place])    CLL monitored   . Clotting disorder ([place])   . 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 insulin aspart  0-20 Units Subcutaneous Q4H  . insulin glargine  12 Units Subcutaneous QHS    Assessment: Patient admitted w/ bilateral leg pain, found to have occlusive DVT being started on heparin drip. Patient takes apixaban 5mg  bid at home.  Goal of Therapy:  Heparin level 0.3-0.7 units/ml corresponding Target aPTT is 66-102s Monitor platelets by anticoagulation protocol: Yes   Plan:  7/19 @ 1900 aPTT 60, HL 0.44 subtherapeutic. Levels do not correlate. Will increase rate to 2100 units/hr. Recheck APTT in 6 hours  7/20 @ 0313 aPTT 84 therapeutic. No HL was collected. Will continue current rate and  will recheck both aPTT/HL @ 0900. If levels correlate, will dose off of HL.  Tobie Lords, PharmD, BCPS Clinical Pharmacist 10/30/2016

## 2016-10-30 NOTE — Progress Notes (Signed)
Patient remains clinically stable post procedure, eating supper, bilateral legs wrapped past upper knee with coban per orders. Denies complaints, no bleeding nor hematoma. Dr Lucky Cowboy out to speak with patient with questions answered.report called to Josefina Do care nurse with questions answered.restarting heparin gtt per orders with no bolus per Dr Lucky Cowboy. Plan for patient to begin coumadin.

## 2016-10-30 NOTE — H&P (Signed)
[facility] History & Physical Update  The patient was interviewed and re-examined.  The patient's previous History and Physical has been reviewed and is unchanged.  There is no change in the plan of care. We plan to proceed with the scheduled procedure.  [name], MD  10/30/2016, 1:47 PM

## 2016-10-30 NOTE — Progress Notes (Signed)
Procedure done, pt tolerated well with vitals stable. Received fentanyl 138mcg iv as well as versed 5mg  iv for procedure. Sinus rhythm per monitor,denies complaints, arouses easily.

## 2016-10-30 NOTE — Progress Notes (Signed)
[facility] for heparin drip Indication: DVT  No Known Allergies  Patient Measurements: Height: 6\' 3"  (190.5 cm) Weight: 256 lb 9.6 oz (116.4 kg) IBW/kg (Calculated) : 84.5 Heparin Dosing Weight: 110.1 kg  Vital Signs: Temp: 98.5 F (36.9 C) (07/20 0429) Temp Source: Oral (07/20 0429) BP: 115/77 (07/20 0939) Pulse Rate: 62 (07/20 0429)  Labs:  Recent Labs  10/28/16 1924  10/28/16 2308 10/29/16 0121 10/29/16 0507 10/29/16 0958 10/29/16 1909 10/30/16 0313 10/30/16 0901  HGB 14.2  --   --   --  14.1  --   --  13.2  --   HCT 44.9  --   --   --  42.5  --   --  40.3  --   PLT 118*  --   --   --  112*  --   --  112*  --   APTT  --   < > 26  --  43* 51* 60* 84* 96*  LABPROT  --   --  13.5  --  14.3  --   --   --  13.6  INR  --   --  1.03  --  1.11  --   --   --  1.04  HEPARINUNFRC  --   < > 0.59  --  0.57 0.58 0.44  --  0.67  CREATININE 1.25*  --   --   --  1.17  --   --  0.94  --   TROPONINI  --   --   --  <0.03  --   --   --   --   --   < > = values in this interval not displayed.  Estimated Creatinine Clearance: 117.9 mL/min (by C-G formula based on SCr of 0.94 mg/dL).   Medical History: Past Medical History:  Diagnosis Date  . Cancer ([place])    CLL monitored   . Clotting disorder ([place])   . 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 insulin aspart  0-20 Units Subcutaneous Q4H  . insulin glargine  12 Units Subcutaneous QHS    Assessment: Patient admitted w/ bilateral leg pain, found to have occlusive DVT being started on heparin drip. Patient takes apixaban 5mg  bid at home.  Goal of Therapy:  Heparin level 0.3-0.7 units/ml corresponding Target aPTT is 66-102s Monitor platelets by anticoagulation protocol: Yes   Plan:  7/19 @ 1900 aPTT 60, HL 0.44 subtherapeutic. Levels do not correlate. Will increase rate to 2100 units/hr. Recheck APTT in 6 hours  7/20 @ 0313 aPTT 84  therapeutic. No HL was collected. Will continue current rate and will recheck both aPTT/HL @ 0900. If levels correlate, will dose off of HL.   7/20 @ 0901 aPTT 96 therapeutic, HL 0.67. Both aPTT/HL correlate, will use HL for monitoring moving forward. Continue current rate of 2100 units/hr (19ml/hr). HL order for 7/21 @ 0500 AM labs   Thomasenia Sales, PharmD, [employer], [facility]     10/30/2016

## 2016-10-30 NOTE — Care Management (Signed)
It is anticipated when patient discharges, it will be on coumadin and not a cost prohibitive anticoagulant.  Patient has active medicaid.  Discussed mobilizing patient when he is medically stable

## 2016-10-30 NOTE — Progress Notes (Addendum)
Met with patient regarding diabetes and his personal diabetes care.  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  He admits to drinking [place] lately and eating at buffets while he is on the road.  Discussed the importance of checking blood sugars and getting his A1C close to 7%- his last A1C was 7.8% in March 2018 but recent blood sugars 302, 258, 216, 308 mg/dl much higher than an A1C of 7.8%.  Patient is not interested in additional medication but is interested in beginning exercising, and taking care of himself. I have told him he may need to have additional medications depending on what the A1C is.  I have ordered the Living Well with Diabetes book and have given him the number for the [facility] for diabetes education (he would need a referral from his MD, should he decide to go).  Patient is seen by Dr/ Riccardo [facility] in [place].    Gentry Fitz, RN, BA, MHA, CDE Diabetes Coordinator Inpatient Diabetes Program  [phone] (Team Pager) [phone] ([place]) 10/30/2016 1:57 PM

## 2016-10-30 NOTE — Progress Notes (Signed)
Patient here today for thrombectomy,previous ivc filter placement. Vitals stable. Attached to monitor pre procedure and will monitor vitals during and throughout entire procedure.

## 2016-10-31 ENCOUNTER — Encounter: Payer: Self-pay | Admitting: Radiology

## 2016-10-31 ENCOUNTER — Inpatient Hospital Stay: Payer: Medicaid Other

## 2016-10-31 LAB — CBC
HCT: 41.4 % (ref 40.0–52.0)
Hemoglobin: 13.4 g/dL (ref 13.0–18.0)
MCH: 29.2 pg (ref 26.0–34.0)
MCHC: 32.5 g/dL (ref 32.0–36.0)
MCV: 90 fL (ref 80.0–100.0)
Platelets: 106 10*3/uL — ABNORMAL LOW (ref 150–440)
RBC: 4.6 MIL/uL (ref 4.40–5.90)
RDW: 16.1 % — ABNORMAL HIGH (ref 11.5–14.5)
WBC: 81.4 10*3/uL — AB (ref 3.8–10.6)

## 2016-10-31 LAB — PROTIME-INR
INR: 1.1
Prothrombin Time: 14.2 seconds (ref 11.4–15.2)

## 2016-10-31 LAB — HEPARIN LEVEL (UNFRACTIONATED): Heparin Unfractionated: 0.63 IU/mL (ref 0.30–0.70)

## 2016-10-31 LAB — GLUCOSE, CAPILLARY
GLUCOSE-CAPILLARY: 222 mg/dL — AB (ref 65–99)
GLUCOSE-CAPILLARY: 244 mg/dL — AB (ref 65–99)
GLUCOSE-CAPILLARY: 235 mg/dL — AB (ref 65–99)
Glucose-Capillary: 178 mg/dL — ABNORMAL HIGH (ref 65–99)
Glucose-Capillary: 180 mg/dL — ABNORMAL HIGH (ref 65–99)
Glucose-Capillary: 228 mg/dL — ABNORMAL HIGH (ref 65–99)

## 2016-10-31 MED ORDER — WARFARIN SODIUM 10 MG PO TABS
10.0000 mg | ORAL_TABLET | Freq: Once | ORAL | Status: AC
  Administered 2016-10-31: 10 mg via ORAL
  Filled 2016-10-31: qty 1

## 2016-10-31 MED ORDER — IOPAMIDOL (ISOVUE-300) INJECTION 61%
125.0000 mL | Freq: Once | INTRAVENOUS | Status: AC | PRN
  Administered 2016-10-31: 125 mL via INTRAVENOUS

## 2016-10-31 MED ORDER — IOPAMIDOL (ISOVUE-300) INJECTION 61%: 30.0000 mL | Freq: Once | INTRAVENOUS | Status: DC | PRN

## 2016-10-31 NOTE — Progress Notes (Signed)
[facility] for heparin drip and warfarin initial  Indication: DVT  No Known Allergies  Patient Measurements: Height: 6\' 3"  (190.5 cm) Weight: 256 lb (116.1 kg) IBW/kg (Calculated) : 84.5 Heparin Dosing Weight: 110.1 kg  Vital Signs: Temp: 97.6 F (36.4 C) (07/21 0421) Temp Source: Oral (07/21 0421) BP: 128/76 (07/21 0421) Pulse Rate: 62 (07/21 0421)  Labs:  Recent Labs  10/28/16 1924  10/29/16 0121 10/29/16 0507  10/29/16 1909 10/30/16 0313 10/30/16 0901 10/31/16 0523  HGB 14.2  --   --  14.1  --   --  13.2  --  13.4  HCT 44.9  --   --  42.5  --   --  40.3  --  41.4  PLT 118*  --   --  112*  --   --  112*  --  106*  APTT  --   < >  --  43*  < > 60* 84* 96*  --   LABPROT  --   < >  --  14.3  --   --   --  13.6 14.2  INR  --   < >  --  1.11  --   --   --  1.04 1.10  HEPARINUNFRC  --   < >  --  0.57  < > 0.44  --  0.67 0.63  CREATININE 1.25*  --   --  1.17  --   --  0.94  --   --   TROPONINI  --   --  <0.03  --   --   --   --   --   --   < > = values in this interval not displayed.  Estimated Creatinine Clearance: 117.6 mL/min (by C-G formula based on SCr of 0.94 mg/dL). Lab Results  Component Value Date   INR 1.10 10/31/2016   INR 1.04 10/30/2016   INR 1.11 10/29/2016     Medical History: Past Medical History:  Diagnosis Date  . Cancer ([place])    CLL monitored   . Clotting disorder ([place])   . 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 insulin aspart  0-20 Units Subcutaneous Q4H  . insulin glargine  12 Units Subcutaneous QHS  . Warfarin - Pharmacist Dosing Inpatient   Does not apply q1800    Assessment: Patient admitted w/ bilateral leg pain, found to have occlusive DVT being started on heparin drip. Patient takes apixaban 5mg  bid at home.  Goal of Therapy:  Heparin level 0.3-0.7 units/ml corresponding Target aPTT is 66-102s Monitor platelets by anticoagulation protocol:  Yes   Plan:  7/19 @ 1900 aPTT 60, HL 0.44 subtherapeutic. Levels do not correlate. Will increase rate to 2100 units/hr. Recheck APTT in 6 hours  7/20 @ 0313 aPTT 84 therapeutic. No HL was collected. Will continue current rate and will recheck both aPTT/HL @ 0900. If levels correlate, will dose off of HL.   7/20 @ 0901 aPTT 96 therapeutic, HL 0.67. Both aPTT/HL correlate, will use HL for monitoring moving forward. Continue current rate of 2100 units/hr (49ml/hr). HL order for 7/21 @ 0500 AM labs   7/20@1420  INR 1.04, warfarin 10mg  po x 1 dose at 1800. Goal INR 2.0 - 3.0, INR followed daily by pharmacy until pt therapeutic. Hgb/HCT 13.2/40.3 Plt 112 from 10/29/16  7/21 @ 0523 HL 0.63 therapeutic. Will continue current rate and will recheck HL w/ am  labs. CBC stable WNL. Levels correlate therefore will dose off of HL's.  Tobie Lords, PharmD, BCPS Clinical Pharmacist 10/31/2016

## 2016-10-31 NOTE — Progress Notes (Signed)
[facility] NAME: Anthony Hart    MR#:  [id number]  DATE OF BIRTH:  [birth year]  SUBJECTIVE:  CHIEF COMPLAINT:   Chief Complaint  Patient presents with  . Leg Pain  . Weakness   - symptomatic bilateral lower extremity DVT. Patient had thrombolysis of his left leg  - started Coumadin after the procedure  REVIEW OF SYSTEMS:  Review of Systems  Constitutional: Negative for chills, fever and malaise/fatigue.  HENT: Negative for ear discharge, hearing loss and nosebleeds.   Eyes: Negative for blurred vision and double vision.  Respiratory: Negative for cough, shortness of breath and wheezing.   Cardiovascular: Positive for leg swelling. Negative for chest pain and palpitations.  Gastrointestinal: Negative for abdominal pain, constipation, diarrhea, nausea and vomiting.  Genitourinary: Negative for dysuria.  Musculoskeletal: Positive for myalgias.  Neurological: Negative for dizziness, speech change, focal weakness, seizures and headaches.  Psychiatric/Behavioral: Negative for depression.    DRUG ALLERGIES:  No Known Allergies  VITALS:  Blood pressure 124/81, pulse (!) 58, temperature 98 F (36.7 C), temperature source Oral, resp. rate 18, height 6\' 3"  (1.905 m), weight 116.1 kg (256 lb), SpO2 96 %.  PHYSICAL EXAMINATION:  Physical Exam  GENERAL:  61 y.o.-year-old patient lying in the bed with no acute distress.  EYES: Pupils equal, round, reactive to light and accommodation. No scleral icterus. Extraocular muscles intact.  HEENT: Head atraumatic, normocephalic. Oropharynx and nasopharynx clear.  NECK:  Supple, no jugular venous distention. No thyroid enlargement, no tenderness.  LUNGS: Normal breath sounds bilaterally, no wheezing, rales,rhonchi or crepitation. No use of accessory muscles of respiration.  CARDIOVASCULAR: S1, S2 normal. No murmurs, rubs, or gallops.  ABDOMEN: Soft, nontender, nondistended. Bowel sounds  present. No organomegaly or mass.  EXTREMITIES: No pedal edema, cyanosis, or clubbing. Minimal calf tenderness noted on the left leg, b/l leg elastic wraps in place. NEUROLOGIC: Cranial nerves II through XII are intact. Muscle strength 5/5 in all extremities. Sensation intact. Gait not checked.  PSYCHIATRIC: The patient is alert and oriented x 3.  SKIN: No obvious rash, lesion, or ulcer.    LABORATORY PANEL:   CBC  Recent Labs Lab 10/31/16 0523  WBC 81.4*  HGB 13.4  HCT 41.4  PLT 106*   ------------------------------------------------------------------------------------------------------------------  Chemistries   Recent Labs Lab 10/30/16 0313  NA 139  K 3.7  CL 110  CO2 24  GLUCOSE 148*  BUN 10  CREATININE 0.94  CALCIUM 8.3*   ------------------------------------------------------------------------------------------------------------------  Cardiac Enzymes  Recent Labs Lab 10/29/16 0121  TROPONINI <0.03   ------------------------------------------------------------------------------------------------------------------  RADIOLOGY:  Ct Abdomen Pelvis W Contrast  Result Date: 10/31/2016 CLINICAL DATA:  Status post right lower extremity DVT thrombolytic therapy and mechanical thrombectomy and placement of right iliac vein stent yesterday. EXAM: CT ABDOMEN AND PELVIS WITH CONTRAST TECHNIQUE: Multidetector CT imaging of the abdomen and pelvis was performed using the standard protocol following bolus administration of intravenous contrast. CONTRAST:  125 mL ISOVUE-300 IOPAMIDOL (ISOVUE-300) INJECTION 61% COMPARISON:  None. FINDINGS: Lower chest: Bibasilar atelectasis, left greater than right. Hepatobiliary: Liver demonstrates mild steatosis. The gallbladder and bile ducts are unremarkable. Pancreas: Unremarkable. No pancreatic ductal dilatation or surrounding inflammatory changes. Spleen: Normal in size without focal abnormality. Adrenals/Urinary Tract: Adrenal glands are  unremarkable. Kidneys are normal, without renal calculi, focal lesion, or hydronephrosis. Bladder is unremarkable. Stomach/Bowel: Bowel shows no evidence of inflammation or obstruction. No free air. No abnormal fluid collections. Vascular/Lymphatic: IVC filter in place in  the infrarenal IVC. No evidence of IVC thrombus. Metallic stent is positioned in the right common iliac vein and shows normal patency. The right external iliac vein is enlarged. The common femoral vein appears normally patent. Left-sided iliac veins and common femoral vein appear normally patent. No evidence of lymphadenopathy. Reproductive: Prostate is unremarkable. Other: No hemorrhage or free fluid.  No hernias identified. Musculoskeletal: Degenerative disc disease at L5-S1. IMPRESSION: Normally patent IVC, IVC filter and right-sided iliac common iliac vein stent. No evidence of bilateral iliac or common femoral DVT by CT. Electronically Signed   By: Aletta Edouard M.D.   On: 10/31/2016 14:44    EKG:   Orders placed or performed during the hospital encounter of 10/28/16  . EKG 12-Lead  . EKG 12-Lead  . EKG 12-Lead  . EKG 12-Lead  . EKG 12-Lead    ASSESSMENT AND PLAN:   61 year old male with past medical history significant for CLL, diabetes mellitus, right leg DVT status post thrombectomy and on eliquis from March 2018 presents to hospital secondary to bilateral leg swelling and pain, worse on the left side.  #1 bilateral extensive DVT-patient has been on eliquis since March 2018 after diagnosis of his extensive right leg DVT. States that he has been compliant. -He is already status post IVC filter placement at that time. CT of the chest this admission is negative for any PE. Concern for filter thrombus as well. -Now bilateral DVT noted.  - s/p thrombectomy 10/30/16 - started on Coumadin after the procedure and bridging with IV heparin - appreciate hematology consult   #2 uncontrolled diabetes mellitus- in March 2018, A1c  of the time was 7.8. Only takes metformin at home. -Sugars are not adequately controlled. Hold metformin due to CT with contrast. -Started on Lantus and sliding scale insulin at this time. Recheck A1c  #3 CLL-has chronic leukocytosis. Monitor, follow up as outpatient -could be the reason for hypercoagulable state. WBC is elevated but not significantly when compared to outpatient labs. -Going for CT of the abdomen to evaluate for lymphadenopathy  #4 EKG changes-appreciate cardiology consult. Noted to be early repolarization changes. No acute intervention needed at this time.  #5 acute renal insufficiency-improved with fluids.   All the records are reviewed and case discussed with Care Management/Social Worker. Management plans discussed with the patient, family and they are in agreement.  CODE STATUS: Full code  TOTAL TIME TAKING CARE OF THIS PATIENT: 33 minutes.   POSSIBLE D/C IN 1-2 DAYS once INR is therapeutic, DEPENDING ON CLINICAL CONDITION.   Vaughan Basta M.D on 10/31/2016 at 3:00 PM  Between 7am to 6pm - Pager - [phone]  After 6pm go to [email or web address] - password [email or web address] [facility]  Office  [phone]  CC: Primary care physician; [name], MD

## 2016-10-31 NOTE — Progress Notes (Signed)
[facility] for heparin drip and warfarin initiation Indication: DVT  No Known Allergies  Patient Measurements: Height: 6\' 3"  (190.5 cm) Weight: 256 lb (116.1 kg) IBW/kg (Calculated) : 84.5 Heparin Dosing Weight: 110.1 kg   Labs:  Recent Labs  10/28/16 1924  10/29/16 0121 10/29/16 0507  10/29/16 1909 10/30/16 0313 10/30/16 0901 10/31/16 0523  HGB 14.2  --   --  14.1  --   --  13.2  --  13.4  HCT 44.9  --   --  42.5  --   --  40.3  --  41.4  PLT 118*  --   --  112*  --   --  112*  --  106*  APTT  --   < >  --  43*  < > 60* 84* 96*  --   LABPROT  --   < >  --  14.3  --   --   --  13.6 14.2  INR  --   < >  --  1.11  --   --   --  1.04 1.10  HEPARINUNFRC  --   < >  --  0.57  < > 0.44  --  0.67 0.63  CREATININE 1.25*  --   --  1.17  --   --  0.94  --   --   TROPONINI  --   --  <0.03  --   --   --   --   --   --   < > = values in this interval not displayed.  Estimated Creatinine Clearance: 117.6 mL/min (by C-G formula based on SCr of 0.94 mg/dL). Lab Results  Component Value Date   INR 1.10 10/31/2016   INR 1.04 10/30/2016   INR 1.11 10/29/2016   Assessment: Patient admitted w/ bilateral leg pain, found to have occlusive DVT being started on heparin drip. Patient takes apixaban 5mg  bid at home.   Transitioning to warfarin due to apixaban failure.   Date INR Warfarin Dose 7/20 1.04 10mg  7/21 1.10   Goal of Therapy:  Heparin level 0.3-0.7 units/ml corresponding Target aPTT is 66-102s Monitor platelets by anticoagulation protocol: Yes   Plan:  Continue herparin 2100 units/hr  Will give warfarin 10mg  x1 tonight  Recheck CBC, HL, and INR tomorrow AM.   Plan to continue heparin until INR therapeutic x 48h.  Rexene Edison, PharmD, BCPS Clinical Pharmacist  10/31/2016 12:32 PM

## 2016-11-01 LAB — GLUCOSE, CAPILLARY
GLUCOSE-CAPILLARY: 184 mg/dL — AB (ref 65–99)
GLUCOSE-CAPILLARY: 132 mg/dL — AB (ref 65–99)
GLUCOSE-CAPILLARY: 227 mg/dL — AB (ref 65–99)
GLUCOSE-CAPILLARY: 243 mg/dL — AB (ref 65–99)
Glucose-Capillary: 246 mg/dL — ABNORMAL HIGH (ref 65–99)
Glucose-Capillary: 276 mg/dL — ABNORMAL HIGH (ref 65–99)

## 2016-11-01 LAB — CBC
HEMATOCRIT: 40.5 % (ref 40.0–52.0)
HEMOGLOBIN: 13.3 g/dL (ref 13.0–18.0)
MCH: 29.6 pg (ref 26.0–34.0)
MCHC: 32.9 g/dL (ref 32.0–36.0)
MCV: 89.8 fL (ref 80.0–100.0)
Platelets: 106 10*3/uL — ABNORMAL LOW (ref 150–440)
RBC: 4.51 MIL/uL (ref 4.40–5.90)
RDW: 16.2 % — ABNORMAL HIGH (ref 11.5–14.5)
WBC: 78.5 10*3/uL (ref 3.8–10.6)

## 2016-11-01 LAB — HEPARIN LEVEL (UNFRACTIONATED)
HEPARIN UNFRACTIONATED: 0.6 [IU]/mL (ref 0.30–0.70)
Heparin Unfractionated: 0.72 IU/mL — ABNORMAL HIGH (ref 0.30–0.70)
Heparin Unfractionated: 0.92 IU/mL — ABNORMAL HIGH (ref 0.30–0.70)

## 2016-11-01 LAB — PROTIME-INR
INR: 1.24
Prothrombin Time: 15.7 seconds — ABNORMAL HIGH (ref 11.4–15.2)

## 2016-11-01 MED ORDER — WARFARIN SODIUM 10 MG PO TABS
10.0000 mg | ORAL_TABLET | Freq: Once | ORAL | Status: AC
  Administered 2016-11-01: 10 mg via ORAL
  Filled 2016-11-01: qty 1

## 2016-11-01 NOTE — Progress Notes (Signed)
[facility] at [place] NAME: Anthony Hart    MR#:  [id number]  DATE OF BIRTH:  Aug 14, 1955  SUBJECTIVE:  CHIEF COMPLAINT:   Chief Complaint  Patient presents with  . Leg Pain  . Weakness   - symptomatic bilateral lower extremity DVT. Patient had thrombolysis of his left leg  - started Coumadin after the procedure, also on IV heparin drip.  REVIEW OF SYSTEMS:  Review of Systems  Constitutional: Negative for chills, fever and malaise/fatigue.  HENT: Negative for ear discharge, hearing loss and nosebleeds.   Eyes: Negative for blurred vision and double vision.  Respiratory: Negative for cough, shortness of breath and wheezing.   Cardiovascular: Positive for leg swelling. Negative for chest pain and palpitations.  Gastrointestinal: Negative for abdominal pain, constipation, diarrhea, nausea and vomiting.  Genitourinary: Negative for dysuria.  Musculoskeletal: Positive for myalgias.  Neurological: Negative for dizziness, speech change, focal weakness, seizures and headaches.  Psychiatric/Behavioral: Negative for depression.    DRUG ALLERGIES:  No Known Allergies  VITALS:  Blood pressure 124/76, pulse 66, temperature (!) 97.3 F (36.3 C), temperature source Oral, resp. rate 20, height 6\' 3"  (1.905 m), weight 116.1 kg (256 lb), SpO2 95 %.  PHYSICAL EXAMINATION:  Physical Exam  GENERAL:  61 y.o.-year-old patient lying in the bed with no acute distress.  EYES: Pupils equal, round, reactive to light and accommodation. No scleral icterus. Extraocular muscles intact.  HEENT: Head atraumatic, normocephalic. Oropharynx and nasopharynx clear.  NECK:  Supple, no jugular venous distention. No thyroid enlargement, no tenderness.  LUNGS: Normal breath sounds bilaterally, no wheezing, rales,rhonchi or crepitation. No use of accessory muscles of respiration.  CARDIOVASCULAR: S1, S2 normal. No murmurs, rubs, or gallops.  ABDOMEN: Soft, nontender,  nondistended. Bowel sounds present. No organomegaly or mass.  EXTREMITIES: No pedal edema, cyanosis, or clubbing. Minimal calf tenderness noted on the left leg, b/l leg elastic wraps in place. NEUROLOGIC: Cranial nerves II through XII are intact. Muscle strength 5/5 in all extremities. Sensation intact. Gait not checked.  PSYCHIATRIC: The patient is alert and oriented x 3.  SKIN: No obvious rash, lesion, or ulcer.    LABORATORY PANEL:   CBC  Recent Labs Lab 11/01/16 0515  WBC 78.5*  HGB 13.3  HCT 40.5  PLT 106*   ------------------------------------------------------------------------------------------------------------------  Chemistries   Recent Labs Lab 10/30/16 0313  NA 139  K 3.7  CL 110  CO2 24  GLUCOSE 148*  BUN 10  CREATININE 0.94  CALCIUM 8.3*   ------------------------------------------------------------------------------------------------------------------  Cardiac Enzymes  Recent Labs Lab 10/29/16 0121  TROPONINI <0.03   ------------------------------------------------------------------------------------------------------------------  RADIOLOGY:  Ct Abdomen Pelvis W Contrast  Result Date: 10/31/2016 CLINICAL DATA:  Status post right lower extremity DVT thrombolytic therapy and mechanical thrombectomy and placement of right iliac vein stent yesterday. EXAM: CT ABDOMEN AND PELVIS WITH CONTRAST TECHNIQUE: Multidetector CT imaging of the abdomen and pelvis was performed using the standard protocol following bolus administration of intravenous contrast. CONTRAST:  125 mL ISOVUE-300 IOPAMIDOL (ISOVUE-300) INJECTION 61% COMPARISON:  None. FINDINGS: Lower chest: Bibasilar atelectasis, left greater than right. Hepatobiliary: Liver demonstrates mild steatosis. The gallbladder and bile ducts are unremarkable. Pancreas: Unremarkable. No pancreatic ductal dilatation or surrounding inflammatory changes. Spleen: Normal in size without focal abnormality. Adrenals/Urinary  Tract: Adrenal glands are unremarkable. Kidneys are normal, without renal calculi, focal lesion, or hydronephrosis. Bladder is unremarkable. Stomach/Bowel: Bowel shows no evidence of inflammation or obstruction. No free air. No abnormal fluid collections. Vascular/Lymphatic:  IVC filter in place in the infrarenal IVC. No evidence of IVC thrombus. Metallic stent is positioned in the right common iliac vein and shows normal patency. The right external iliac vein is enlarged. The common femoral vein appears normally patent. Left-sided iliac veins and common femoral vein appear normally patent. No evidence of lymphadenopathy. Reproductive: Prostate is unremarkable. Other: No hemorrhage or free fluid.  No hernias identified. Musculoskeletal: Degenerative disc disease at L5-S1. IMPRESSION: Normally patent IVC, IVC filter and right-sided iliac common iliac vein stent. No evidence of bilateral iliac or common femoral DVT by CT. Electronically Signed   By: Aletta Edouard M.D.   On: 10/31/2016 14:44    EKG:   Orders placed or performed during the hospital encounter of 10/28/16  . EKG 12-Lead  . EKG 12-Lead  . EKG 12-Lead  . EKG 12-Lead  . EKG 12-Lead    ASSESSMENT AND PLAN:   61 year old male with past medical history significant for CLL, diabetes mellitus, right leg DVT status post thrombectomy and on eliquis from March 2018 presents to hospital secondary to bilateral leg swelling and pain, worse on the left side.  #1 bilateral extensive DVT-patient has been on eliquis since March 2018 after diagnosis of his extensive right leg DVT. States that he has been compliant. -He is already status post IVC filter placement at that time. CT of the chest this admission is negative for any    PE. Concern for filter thrombus as well. - Now bilateral DVT noted.  - s/p thrombectomy 10/30/16 - started on Coumadin after the procedure and bridging with IV heparin- waiting for therapeutic INR. - appreciate hematology  consult   #2 uncontrolled diabetes mellitus- in March 2018, A1c of the time was 7.8. Only takes metformin at home. -Sugars are not adequately controlled. Hold metformin due to CT with contrast. -Started on Lantus and sliding scale insulin at this time. Recheck A1c  #3 CLL-has chronic leukocytosis. Monitor, follow up as outpatient -could be the reason for hypercoagulable state. WBC is elevated but not significantly when compared to outpatient labs. -Going for CT of the abdomen to evaluate for lymphadenopathy  #4 EKG changes-appreciate cardiology consult. Noted to be early repolarization changes. No acute intervention needed at this time.   #5 acute renal insufficiency-improved with fluids.   All the records are reviewed and case discussed with Care Management/Social Worker. Management plans discussed with the patient, family and they are in agreement.  CODE STATUS: Full code  TOTAL TIME TAKING CARE OF THIS PATIENT: 33 minutes.   POSSIBLE D/C IN 1-2 DAYS once INR is therapeutic, DEPENDING ON CLINICAL CONDITION.   Vaughan Basta M.D on 11/01/2016 at 2:21 PM  Between 7am to 6pm - Pager - [phone]  After 6pm go to [email or web address] - password [email or web address] [facility]  Office  [phone]  CC: Primary care physician; [name], MD

## 2016-11-01 NOTE — Progress Notes (Signed)
Anthony Hart for heparin drip and warfarin initial  Indication: DVT  No Known Allergies  Patient Measurements: Height: 6\' 3"  (190.5 cm) Weight: 256 lb (116.1 kg) IBW/kg (Calculated) : 84.5 Heparin Dosing Weight: 110.1 kg  Vital Signs: Temp: 98.3 F (36.8 C) (07/22 0432) Temp Source: Oral (07/22 0432) BP: 114/65 (07/22 0432) Pulse Rate: 73 (07/22 0432)  Labs:  Recent Labs  10/29/16 1909  10/30/16 0313 10/30/16 0901 10/31/16 0523 11/01/16 0515  HGB  --   < > 13.2  --  13.4 13.3  HCT  --   --  40.3  --  41.4 40.5  PLT  --   --  112*  --  106* 106*  APTT 60*  --  84* 96*  --   --   LABPROT  --   --   --  13.6 14.2 15.7*  INR  --   --   --  1.04 1.10 1.24  HEPARINUNFRC 0.44  --   --  0.67 0.63 0.60  CREATININE  --   --  0.94  --   --   --   < > = values in this interval not displayed.  Estimated Creatinine Clearance: 117.6 mL/min (by C-G formula based on SCr of 0.94 mg/dL). Lab Results  Component Value Date   INR 1.24 11/01/2016   INR 1.10 10/31/2016   INR 1.04 10/30/2016     Medical History: Past Medical History:  Diagnosis Date  . Cancer ([place])    CLL monitored   . Clotting disorder ([place])   . 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 insulin aspart  0-20 Units Subcutaneous Q4H  . insulin glargine  12 Units Subcutaneous QHS  . Warfarin - Pharmacist Dosing Inpatient   Does not apply q1800    Assessment: Patient admitted w/ bilateral leg pain, found to have occlusive DVT being started on heparin drip. Patient takes apixaban 5mg  bid at home.  Goal of Therapy:  Heparin level 0.3-0.7 units/ml corresponding Target aPTT is 66-102s Monitor platelets by anticoagulation protocol: Yes   Plan:  7/19 @ 1900 aPTT 60, HL 0.44 subtherapeutic. Levels do not correlate. Will increase rate to 2100 units/hr. Recheck APTT in 6 hours  7/20 @ 0313 aPTT 84 therapeutic. No HL was collected. Will  continue current rate and will recheck both aPTT/HL @ 0900. If levels correlate, will dose off of HL.   7/20 @ 0901 aPTT 96 therapeutic, HL 0.67. Both aPTT/HL correlate, will use HL for monitoring moving forward. Continue current rate of 2100 units/hr (61ml/hr). HL order for 7/21 @ 0500 AM labs   7/20@1420  INR 1.04, warfarin 10mg  po x 1 dose at 1800. Goal INR 2.0 - 3.0, INR followed daily by pharmacy until pt therapeutic. Hgb/HCT 13.2/40.3 Plt 112 from 10/29/16  7/21 @ 0523 HL 0.63 therapeutic. Will continue current rate and will recheck HL w/ am labs. CBC stable WNL. Levels correlate therefore will dose off of HL's.  7/22 @ 0500 HL 0.60 therapeutic. Will continue current rate and will recheck HL @ 1100.  Tobie Lords, PharmD, BCPS Clinical Pharmacist 11/01/2016

## 2016-11-01 NOTE — Progress Notes (Signed)
[facility] for heparin drip and warfarin initiation Indication: DVT  No Known Allergies  Patient Measurements: Height: 6\' 3"  (190.5 cm) Weight: 256 lb (116.1 kg) IBW/kg (Calculated) : 84.5 Heparin Dosing Weight: 110.1 kg   Labs:  Recent Labs  10/30/16 0313  10/30/16 0901 10/31/16 0523 11/01/16 0515 11/01/16 1032 11/01/16 2047  HGB 13.2  --   --  13.4 13.3  --   --   HCT 40.3  --   --  41.4 40.5  --   --   PLT 112*  --   --  106* 106*  --   --   APTT 84*  --  96*  --   --   --   --   LABPROT  --   --  13.6 14.2 15.7*  --   --   INR  --   --  1.04 1.10 1.24  --   --   HEPARINUNFRC  --   < > 0.67 0.63 0.60 0.72* 0.92*  CREATININE 0.94  --   --   --   --   --   --   < > = values in this interval not displayed.  Estimated Creatinine Clearance: 117.6 mL/min (by C-G formula based on SCr of 0.94 mg/dL). Lab Results  Component Value Date   INR 1.24 11/01/2016   INR 1.10 10/31/2016   INR 1.04 10/30/2016   Assessment: Patient admitted w/ bilateral leg pain, found to have occlusive DVT being started on heparin drip. Patient takes apixaban 5mg  bid at home.   Transitioning to warfarin due to apixaban failure.   Date INR Warfarin Dose 7/20 1.04 10mg  7/21 1.10 10mg  7/22 1.24  Goal of Therapy:  Heparin level 0.3-0.7 units/ml corresponding Target aPTT is 66-102s Monitor platelets by anticoagulation protocol: Yes   Plan:  HL supratherapeutic. Will reduce rate to 1800 units/hr and recheck in 6 hours  Plan to continue heparin until INR therapeutic x 48h.  Ramond Dial, Pharm.D, BCPS Clinical Pharmacist  11/01/2016 9:33 PM

## 2016-11-01 NOTE — Progress Notes (Signed)
[facility] for heparin drip and warfarin initiation Indication: DVT  No Known Allergies  Patient Measurements: Height: 6\' 3"  (190.5 cm) Weight: 256 lb (116.1 kg) IBW/kg (Calculated) : 84.5 Heparin Dosing Weight: 110.1 kg   Labs:  Recent Labs  10/29/16 1909  10/30/16 0313 10/30/16 0901 10/31/16 0523 11/01/16 0515 11/01/16 1032  HGB  --   < > 13.2  --  13.4 13.3  --   HCT  --   --  40.3  --  41.4 40.5  --   PLT  --   --  112*  --  106* 106*  --   APTT 60*  --  84* 96*  --   --   --   LABPROT  --   --   --  13.6 14.2 15.7*  --   INR  --   --   --  1.04 1.10 1.24  --   HEPARINUNFRC 0.44  --   --  0.67 0.63 0.60 0.72*  CREATININE  --   --  0.94  --   --   --   --   < > = values in this interval not displayed.  Estimated Creatinine Clearance: 117.6 mL/min (by C-G formula based on SCr of 0.94 mg/dL). Lab Results  Component Value Date   INR 1.24 11/01/2016   INR 1.10 10/31/2016   INR 1.04 10/30/2016   Assessment: Patient admitted w/ bilateral leg pain, found to have occlusive DVT being started on heparin drip. Patient takes apixaban 5mg  bid at home.   Transitioning to warfarin due to apixaban failure.   Date INR Warfarin Dose 7/20 1.04 10mg  7/21 1.10 10mg  7/22 1.24  Goal of Therapy:  Heparin level 0.3-0.7 units/ml corresponding Target aPTT is 66-102s Monitor platelets by anticoagulation protocol: Yes   Plan:  Heparin level slightly elevated, will reduce rate to 2000 units/hr and recheck in 8 hours  INR subtherapeutic, trending up. Will give warfarin 10mg  tonight. Recheck INR with AM labs  Plan to continue heparin until INR therapeutic x 48h.  Rexene Edison, PharmD, BCPS Clinical Pharmacist  11/01/2016 1:26 PM

## 2016-11-02 LAB — HEPARIN LEVEL (UNFRACTIONATED)
HEPARIN UNFRACTIONATED: 0.58 [IU]/mL (ref 0.30–0.70)
Heparin Unfractionated: 0.54 IU/mL (ref 0.30–0.70)

## 2016-11-02 LAB — GLUCOSE, CAPILLARY
GLUCOSE-CAPILLARY: 213 mg/dL — AB (ref 65–99)
Glucose-Capillary: 114 mg/dL — ABNORMAL HIGH (ref 65–99)
Glucose-Capillary: 156 mg/dL — ABNORMAL HIGH (ref 65–99)
Glucose-Capillary: 170 mg/dL — ABNORMAL HIGH (ref 65–99)
Glucose-Capillary: 192 mg/dL — ABNORMAL HIGH (ref 65–99)
Glucose-Capillary: 255 mg/dL — ABNORMAL HIGH (ref 65–99)

## 2016-11-02 LAB — PROTIME-INR
INR: 1.36
Prothrombin Time: 16.9 seconds — ABNORMAL HIGH (ref 11.4–15.2)

## 2016-11-02 LAB — CBC
HEMATOCRIT: 40.4 % (ref 40.0–52.0)
HEMATOCRIT: 39.1 % — AB (ref 40.0–52.0)
HEMOGLOBIN: 13.2 g/dL (ref 13.0–18.0)
HEMOGLOBIN: 12.8 g/dL — AB (ref 13.0–18.0)
MCH: 29.4 pg (ref 26.0–34.0)
MCH: 28.8 pg (ref 26.0–34.0)
MCHC: 32.7 g/dL (ref 32.0–36.0)
MCHC: 32.7 g/dL (ref 32.0–36.0)
MCV: 89.8 fL (ref 80.0–100.0)
MCV: 88.1 fL (ref 80.0–100.0)
Platelets: 110 10*3/uL — ABNORMAL LOW (ref 150–440)
Platelets: 106 10*3/uL — ABNORMAL LOW (ref 150–440)
RBC: 4.49 MIL/uL (ref 4.40–5.90)
RBC: 4.43 MIL/uL (ref 4.40–5.90)
RDW: 16.4 % — ABNORMAL HIGH (ref 11.5–14.5)
RDW: 16.7 % — ABNORMAL HIGH (ref 11.5–14.5)
WBC: 79.7 10*3/uL (ref 3.8–10.6)
WBC: 76.4 10*3/uL (ref 3.8–10.6)

## 2016-11-02 MED ORDER — WARFARIN SODIUM 7.5 MG PO TABS
15.0000 mg | ORAL_TABLET | Freq: Once | ORAL | Status: AC
  Administered 2016-11-02: 15 mg via ORAL
  Filled 2016-11-02: qty 2

## 2016-11-02 MED ORDER — INSULIN GLARGINE 100 UNIT/ML ~~LOC~~ SOLN
20.0000 [IU] | Freq: Every day | SUBCUTANEOUS | Status: DC
  Administered 2016-11-03 (×2): 20 [IU] via SUBCUTANEOUS
  Filled 2016-11-02 (×3): qty 0.2

## 2016-11-02 NOTE — Plan of Care (Signed)
Problem: Tissue Perfusion: Goal: Risk factors for ineffective tissue perfusion will decrease Outcome: Progressing Heparin gtt/coumadin

## 2016-11-02 NOTE — Progress Notes (Signed)
Cobain dressing removed from bil legs.  Small 2x2 and tegaderm intact to back of bil knee incisions.  Pt tolerated well

## 2016-11-02 NOTE — Progress Notes (Signed)
[facility] at [place] NAME: Anthony Hart    MR#:  [id number]  DATE OF BIRTH:  08/26/1955  SUBJECTIVE:  CHIEF COMPLAINT:   Chief Complaint  Patient presents with  . Leg Pain  . Weakness   - symptomatic bilateral lower extremity DVT. Patient had thrombolysis of his left leg  - started Coumadin after the procedure, also on IV heparin drip.  REVIEW OF SYSTEMS:  Review of Systems  Constitutional: Negative for chills, fever and malaise/fatigue.  HENT: Negative for ear discharge, hearing loss and nosebleeds.   Eyes: Negative for blurred vision and double vision.  Respiratory: Negative for cough, shortness of breath and wheezing.   Cardiovascular: Positive for leg swelling. Negative for chest pain and palpitations.  Gastrointestinal: Negative for abdominal pain, constipation, diarrhea, nausea and vomiting.  Genitourinary: Negative for dysuria.  Musculoskeletal: Positive for myalgias.  Neurological: Negative for dizziness, speech change, focal weakness, seizures and headaches.  Psychiatric/Behavioral: Negative for depression.    DRUG ALLERGIES:  No Known Allergies  VITALS:  Blood pressure 107/64, pulse 69, temperature 98.2 F (36.8 C), temperature source Oral, resp. rate 17, height 6\' 3"  (1.905 m), weight 116.1 kg (256 lb), SpO2 94 %.  PHYSICAL EXAMINATION:  Physical Exam  GENERAL:  61 y.o.-year-old patient lying in the bed with no acute distress.  EYES: Pupils equal, round, reactive to light and accommodation. No scleral icterus. Extraocular muscles intact.  HEENT: Head atraumatic, normocephalic. Oropharynx and nasopharynx clear.  NECK:  Supple, no jugular venous distention. No thyroid enlargement, no tenderness.  LUNGS: Normal breath sounds bilaterally, no wheezing, rales,rhonchi or crepitation. No use of accessory muscles of respiration.  CARDIOVASCULAR: S1, S2 normal. No murmurs, rubs, or gallops.  ABDOMEN: Soft, nontender,  nondistended. Bowel sounds present. No organomegaly or mass.  EXTREMITIES: No pedal edema, cyanosis, or clubbing. Minimal calf tenderness noted on the left leg, b/l leg elastic wraps in place. NEUROLOGIC: Cranial nerves II through XII are intact. Muscle strength 5/5 in all extremities. Sensation intact. Gait not checked.  PSYCHIATRIC: The patient is alert and oriented x 3.  SKIN: No obvious rash, lesion, or ulcer.    LABORATORY PANEL:   CBC  Recent Labs Lab 11/02/16 0945  WBC 76.4*  HGB 12.8*  HCT 39.1*  PLT 106*   ------------------------------------------------------------------------------------------------------------------  Chemistries   Recent Labs Lab 10/30/16 0313  NA 139  K 3.7  CL 110  CO2 24  GLUCOSE 148*  BUN 10  CREATININE 0.94  CALCIUM 8.3*   ------------------------------------------------------------------------------------------------------------------  Cardiac Enzymes  Recent Labs Lab 10/29/16 0121  TROPONINI <0.03   ------------------------------------------------------------------------------------------------------------------  RADIOLOGY:  No results found.  EKG:   Orders placed or performed during the hospital encounter of 10/28/16  . EKG 12-Lead  . EKG 12-Lead  . EKG 12-Lead  . EKG 12-Lead  . EKG 12-Lead    ASSESSMENT AND PLAN:   61 year old male with past medical history significant for CLL, diabetes mellitus, right leg DVT status post thrombectomy and on eliquis from March 2018 presents to hospital secondary to bilateral leg swelling and pain, worse on the left side.  #1 bilateral extensive DVT-patient has been on eliquis since March 2018 after diagnosis of his extensive right leg DVT. States that he has been compliant. -He is already status post IVC filter placement at that time. CT of the chest this admission is negative for any    PE. Concern for filter thrombus as well. - Now bilateral DVT noted.  -  s/p thrombectomy  10/30/16 - started on Coumadin after the procedure and bridging with IV heparin- waiting for therapeutic INR. - appreciate hematology consult   #2 uncontrolled diabetes mellitus- in March 2018, A1c of the time was 7.8. Only takes metformin at home. -Sugars are not adequately controlled. Hold metformin due to CT with contrast. -Started on Lantus and sliding scale insulin at this time. Recheck A1c  #3 CLL-has chronic leukocytosis. Monitor, follow up as outpatient -could be the reason for hypercoagulable state. WBC is elevated but not significantly when compared to outpatient labs. -Going for CT of the abdomen to evaluate for lymphadenopathy  #4 EKG changes-appreciate cardiology consult. Noted to be early repolarization changes. No acute intervention needed at this time.   #5 acute renal insufficiency-improved with fluids.   All the records are reviewed and case discussed with Care Management/Social Worker. Management plans discussed with the patient, family and they are in agreement.  CODE STATUS: Full code  TOTAL TIME TAKING CARE OF THIS PATIENT: 25 minutes.   POSSIBLE D/C IN 1-2 DAYS once INR is therapeutic, DEPENDING ON CLINICAL CONDITION.   Vaughan Basta M.D on 11/02/2016 at 7:29 PM  Between 7am to 6pm - Pager - [phone]  After 6pm go to [email or web address] - password [email or web address] [facility]  Office  [phone]  CC: Primary care physician; [name], MD

## 2016-11-02 NOTE — Progress Notes (Signed)
[facility] for heparin drip and warfarin initiation Indication: DVT  No Known Allergies  Patient Measurements: Height: 6\' 3"  (190.5 cm) Weight: 256 lb (116.1 kg) IBW/kg (Calculated) : 84.5 Heparin Dosing Weight: 110.1 kg   Labs:  Recent Labs  10/31/16 0523 11/01/16 0515  11/01/16 2047 11/02/16 0359 11/02/16 0945  HGB 13.4 13.3  --   --  13.2 12.8*  HCT 41.4 40.5  --   --  40.4 39.1*  PLT 106* 106*  --   --  110* 106*  LABPROT 14.2 15.7*  --   --  16.9*  --   INR 1.10 1.24  --   --  1.36  --   HEPARINUNFRC 0.63 0.60  < > 0.92* 0.54 0.58  < > = values in this interval not displayed.  Estimated Creatinine Clearance: 117.6 mL/min (by C-G formula based on SCr of 0.94 mg/dL). Lab Results  Component Value Date   INR 1.36 11/02/2016   INR 1.24 11/01/2016   INR 1.10 10/31/2016   Assessment: Patient admitted w/ bilateral leg pain, found to have occlusive DVT being started on heparin drip. Patient takes apixaban 5mg  bid at home.   Transitioning to warfarin due to apixaban failure.   Date INR Warfarin Dose 7/20 1.04 10mg  7/21 1.10 10mg  7/22 1.24 10mg  7/23 1.36   Goal of Therapy:  Heparin level 0.3-0.7 units/ml corresponding Target aPTT is 66-102s Monitor platelets by anticoagulation protocol: Yes   Plan:  Heparin level therapeutic x2. Continue current rate. Recheck HL with AM labs.   INR subtherapeutic, trending up. Will increase warfarin to 15mg  this evening. Recheck INR with AM labs.   Plan to continue heparin until INR therapeutic x 48h.  Rexene Edison, PharmD, BCPS Clinical Pharmacist  11/02/2016 10:46 AM

## 2016-11-02 NOTE — Care Management (Signed)
Informed that patient is independent in his room and no discharge needs identified by members of care team.

## 2016-11-02 NOTE — Progress Notes (Signed)
[facility] for heparin drip and warfarin initiation Indication: DVT  No Known Allergies  Patient Measurements: Height: 6\' 3"  (190.5 cm) Weight: 256 lb (116.1 kg) IBW/kg (Calculated) : 84.5 Heparin Dosing Weight: 110.1 kg   Labs:  Recent Labs  10/30/16 0901  10/31/16 0523 11/01/16 0515 11/01/16 1032 11/01/16 2047 11/02/16 0359  HGB  --   < > 13.4 13.3  --   --  13.2  HCT  --   --  41.4 40.5  --   --  40.4  PLT  --   --  106* 106*  --   --  110*  APTT 96*  --   --   --   --   --   --   LABPROT 13.6  --  14.2 15.7*  --   --  16.9*  INR 1.04  --  1.10 1.24  --   --  1.36  HEPARINUNFRC 0.67  --  0.63 0.60 0.72* 0.92* 0.54  < > = values in this interval not displayed.  Estimated Creatinine Clearance: 117.6 mL/min (by C-G formula based on SCr of 0.94 mg/dL). Lab Results  Component Value Date   INR 1.36 11/02/2016   INR 1.24 11/01/2016   INR 1.10 10/31/2016   Assessment: Patient admitted w/ bilateral leg pain, found to have occlusive DVT being started on heparin drip. Patient takes apixaban 5mg  bid at home.   Transitioning to warfarin due to apixaban failure.   Date INR Warfarin Dose 7/20 1.04 10mg  7/21 1.10 10mg  7/22 1.24  Goal of Therapy:  Heparin level 0.3-0.7 units/ml corresponding Target aPTT is 66-102s Monitor platelets by anticoagulation protocol: Yes   Plan:  HL supratherapeutic. Will reduce rate to 1800 units/hr and recheck in 6 hours  Plan to continue heparin until INR therapeutic x 48h.  7/23 @ 0400 HL 0.54 therapeutic. Will continue current rate and will recheck next HL/CBC @ 1000. CBC currently stable.  Tobie Lords, PharmD, BCPS Clinical Pharmacist 11/02/2016

## 2016-11-03 ENCOUNTER — Encounter: Payer: Self-pay | Admitting: Vascular Surgery

## 2016-11-03 LAB — CBC
HEMATOCRIT: 40.6 % (ref 40.0–52.0)
Hemoglobin: 13.2 g/dL (ref 13.0–18.0)
MCH: 28.6 pg (ref 26.0–34.0)
MCHC: 32.6 g/dL (ref 32.0–36.0)
MCV: 87.8 fL (ref 80.0–100.0)
PLATELETS: 116 10*3/uL — AB (ref 150–440)
RBC: 4.62 MIL/uL (ref 4.40–5.90)
RDW: 16.2 % — ABNORMAL HIGH (ref 11.5–14.5)
WBC: 79.5 10*3/uL — AB (ref 3.8–10.6)

## 2016-11-03 LAB — GLUCOSE, CAPILLARY
GLUCOSE-CAPILLARY: 124 mg/dL — AB (ref 65–99)
GLUCOSE-CAPILLARY: 177 mg/dL — AB (ref 65–99)
GLUCOSE-CAPILLARY: 240 mg/dL — AB (ref 65–99)
GLUCOSE-CAPILLARY: 246 mg/dL — AB (ref 65–99)
Glucose-Capillary: 124 mg/dL — ABNORMAL HIGH (ref 65–99)
Glucose-Capillary: 187 mg/dL — ABNORMAL HIGH (ref 65–99)
Glucose-Capillary: 214 mg/dL — ABNORMAL HIGH (ref 65–99)

## 2016-11-03 LAB — PROTIME-INR
INR: 1.62
Prothrombin Time: 19.4 seconds — ABNORMAL HIGH (ref 11.4–15.2)

## 2016-11-03 LAB — HEPARIN LEVEL (UNFRACTIONATED): HEPARIN UNFRACTIONATED: 0.51 [IU]/mL (ref 0.30–0.70)

## 2016-11-03 MED ORDER — WARFARIN SODIUM 7.5 MG PO TABS
15.0000 mg | ORAL_TABLET | Freq: Once | ORAL | Status: AC
  Administered 2016-11-03: 15 mg via ORAL
  Filled 2016-11-03: qty 2

## 2016-11-03 NOTE — Plan of Care (Signed)
Problem: Activity: Goal: Risk for activity intolerance will decrease Outcome: Not Progressing Encourage patient to ambulate to walk around nurse's desk

## 2016-11-03 NOTE — Progress Notes (Signed)
[facility] [place] NAME: Anthony Hart    MR#:  [id number]  DATE OF BIRTH:  29-Jan-1956  SUBJECTIVE:  CHIEF COMPLAINT:   Chief Complaint  Patient presents with  . Leg Pain  . Weakness   - symptomatic bilateral lower extremity DVT. Patient had thrombolysis of his left leg  - started Coumadin after the procedure, also on IV heparin drip.  INR is still subtherapeutic.  REVIEW OF SYSTEMS:  Review of Systems  Constitutional: Negative for chills, fever and malaise/fatigue.  HENT: Negative for ear discharge, hearing loss and nosebleeds.   Eyes: Negative for blurred vision and double vision.  Respiratory: Negative for cough, shortness of breath and wheezing.   Cardiovascular: Positive for leg swelling. Negative for chest pain and palpitations.  Gastrointestinal: Negative for abdominal pain, constipation, diarrhea, nausea and vomiting.  Genitourinary: Negative for dysuria.  Musculoskeletal: Positive for myalgias.  Neurological: Negative for dizziness, speech change, focal weakness, seizures and headaches.  Psychiatric/Behavioral: Negative for depression.    DRUG ALLERGIES:  No Known Allergies  VITALS:  Blood pressure 110/65, pulse 68, temperature 97.7 F (36.5 C), temperature source Oral, resp. rate 18, height 6\' 3"  (1.905 m), weight 116.1 kg (256 lb), SpO2 96 %.  PHYSICAL EXAMINATION:  Physical Exam  GENERAL:  61 y.o.-year-old patient lying in the bed with no acute distress.  EYES: Pupils equal, round, reactive to light and accommodation. No scleral icterus. Extraocular muscles intact.  HEENT: Head atraumatic, normocephalic. Oropharynx and nasopharynx clear.  NECK:  Supple, no jugular venous distention. No thyroid enlargement, no tenderness.  LUNGS: Normal breath sounds bilaterally, no wheezing, rales,rhonchi or crepitation. No use of accessory muscles of respiration.  CARDIOVASCULAR: S1, S2 normal. No murmurs, rubs, or gallops.    ABDOMEN: Soft, nontender, nondistended. Bowel sounds present. No organomegaly or mass.  EXTREMITIES: No pedal edema, cyanosis, or clubbing. Minimal calf tenderness noted on the left leg, b/l leg elastic wraps in place. NEUROLOGIC: Cranial nerves II through XII are intact. Muscle strength 5/5 in all extremities. Sensation intact. Gait not checked.  PSYCHIATRIC: The patient is alert and oriented x 3.  SKIN: No obvious rash, lesion, or ulcer.    LABORATORY PANEL:   CBC  Recent Labs Lab 11/03/16 0646  WBC 79.5*  HGB 13.2  HCT 40.6  PLT 116*   ------------------------------------------------------------------------------------------------------------------  Chemistries   Recent Labs Lab 10/30/16 0313  NA 139  K 3.7  CL 110  CO2 24  GLUCOSE 148*  BUN 10  CREATININE 0.94  CALCIUM 8.3*   ------------------------------------------------------------------------------------------------------------------  Cardiac Enzymes  Recent Labs Lab 10/29/16 0121  TROPONINI <0.03   ------------------------------------------------------------------------------------------------------------------  RADIOLOGY:  No results found.  EKG:   Orders placed or performed during the hospital encounter of 10/28/16  . EKG 12-Lead  . EKG 12-Lead  . EKG 12-Lead  . EKG 12-Lead  . EKG 12-Lead    ASSESSMENT AND PLAN:   61 year old male with past medical history significant for CLL, diabetes mellitus, right leg DVT status post thrombectomy and on eliquis from March 2018 presents to hospital secondary to bilateral leg swelling and pain, worse on the left side.  #1 bilateral extensive DVT-patient has been on eliquis since March 2018 after diagnosis of his extensive right leg DVT. States that he has been compliant. -He is already status post IVC filter placement at that time. CT of the chest this admission is negative for any PE.  - Now bilateral DVT noted.  - s/p thrombectomy  10/30/16 - started  on Coumadin after the procedure and bridging with IV heparin- waiting for therapeutic INR. - appreciate hematology consult   #2 uncontrolled diabetes mellitus- in March 2018, A1c of the time was 7.8. Only takes metformin at home. -Sugars are not adequately controlled. Hold metformin due to CT with contrast. -Started on Lantus and sliding scale insulin at this time. Rechecked A1c- still in process.  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 Monitor, follow up as outpatient -could be the reason for hypercoagulable state. WBC is elevated but not significantly when compared to outpatient labs. - CT abd is negative for LN pathy.  #4 EKG changes-appreciate cardiology consult. Noted to be early repolarization changes. No acute intervention needed at this time.   #5 acute renal insufficiency-improved with fluids.   All the records are reviewed and case discussed with Care Management/Social Worker. Management plans discussed with the patient, family and they are in agreement.  CODE STATUS: Full code  TOTAL TIME TAKING CARE OF THIS PATIENT: 25 minutes.   POSSIBLE D/C IN 1-2 DAYS once INR is therapeutic, DEPENDING ON CLINICAL CONDITION.   Vaughan Basta M.D on 11/03/2016 at 5:29 PM  Between 7am to 6pm - Pager - [phone]  After 6pm go to [email or web address] - password [email or web address] [facility]  Office  [phone]  CC: Primary care physician; [name], MD

## 2016-11-03 NOTE — Progress Notes (Signed)
[place] for heparin drip and warfarin initiation Indication: DVT  No Known Allergies  Patient Measurements: Height: 6\' 3"  (190.5 cm) Weight: 256 lb (116.1 kg) IBW/kg (Calculated) : 84.5 Heparin Dosing Weight: 110.1 kg   Labs:  Recent Labs  11/01/16 0515  11/02/16 0359 11/02/16 0945 11/03/16 0646  HGB 13.3  --  13.2 12.8* 13.2  HCT 40.5  --  40.4 39.1* 40.6  PLT 106*  --  110* 106* 116*  LABPROT 15.7*  --  16.9*  --  19.4*  INR 1.24  --  1.36  --  1.62  HEPARINUNFRC 0.60  < > 0.54 0.58 0.51  < > = values in this interval not displayed.  Estimated Creatinine Clearance: 117.6 mL/min (by C-G formula based on SCr of 0.94 mg/dL). Lab Results  Component Value Date   INR 1.62 11/03/2016   INR 1.36 11/02/2016   INR 1.24 11/01/2016   Assessment: Patient admitted w/ bilateral leg pain, found to have occlusive DVT being started on heparin drip. Patient takes apixaban 5mg  bid at home.   Transitioning to warfarin due to apixaban failure.   Date INR Warfarin Dose 7/20 1.04 10mg  7/21 1.10 10mg  7/22 1.24 10mg  7/23 1.36 15 mg  7/24      1.62   Goal of Therapy:  Heparin level 0.3-0.7 units/ml corresponding Target aPTT is 66-102s Monitor platelets by anticoagulation protocol: Yes   Plan:  Heparin level therapeutic x2. Continue current rate. Recheck HL with AM labs.   INR subtherapeutic, trending up. Will give warfarin to 15mg  this evening. Recheck INR with AM labs.   Plan to continue heparin until INR therapeutic x 48h.  Larene Beach, PharmD  Clinical Pharmacist  11/03/2016 7:36 AM

## 2016-11-04 LAB — PROTIME-INR
INR: 1.93
INR: 2.08
PROTHROMBIN TIME: 22.3 s — AB (ref 11.4–15.2)
PROTHROMBIN TIME: 23.7 s — AB (ref 11.4–15.2)

## 2016-11-04 LAB — BASIC METABOLIC PANEL
Anion gap: 7 (ref 5–15)
BUN: 14 mg/dL (ref 6–20)
CHLORIDE: 108 mmol/L (ref 101–111)
CO2: 24 mmol/L (ref 22–32)
Calcium: 9 mg/dL (ref 8.9–10.3)
Creatinine, Ser: 0.93 mg/dL (ref 0.61–1.24)
GFR calc Af Amer: >60 mL/min (ref >60)
GFR calc non Af Amer: >60 mL/min (ref >60)
GLUCOSE: 156 mg/dL — AB (ref 65–99)
POTASSIUM: 4.3 mmol/L (ref 3.5–5.1)
Sodium: 139 mmol/L (ref 135–145)

## 2016-11-04 LAB — GLUCOSE, CAPILLARY
Glucose-Capillary: 185 mg/dL — ABNORMAL HIGH (ref 65–99)
Glucose-Capillary: 125 mg/dL — ABNORMAL HIGH (ref 65–99)
Glucose-Capillary: 242 mg/dL — ABNORMAL HIGH (ref 65–99)

## 2016-11-04 LAB — CBC
HCT: 41 % (ref 40.0–52.0)
HEMOGLOBIN: 13.4 g/dL (ref 13.0–18.0)
MCH: 28.9 pg (ref 26.0–34.0)
MCHC: 32.8 g/dL (ref 32.0–36.0)
MCV: 88.2 fL (ref 80.0–100.0)
Platelets: 118 10*3/uL — ABNORMAL LOW (ref 150–440)
RBC: 4.65 MIL/uL (ref 4.40–5.90)
RDW: 16.3 % — ABNORMAL HIGH (ref 11.5–14.5)
WBC: 78.2 10*3/uL (ref 3.8–10.6)

## 2016-11-04 LAB — HEPARIN LEVEL (UNFRACTIONATED): Heparin Unfractionated: 0.45 IU/mL (ref 0.30–0.70)

## 2016-11-04 MED ORDER — WARFARIN SODIUM 7.5 MG PO TABS: 7.5000 mg | ORAL_TABLET | Freq: Every day | ORAL | 0 refills | Status: DC

## 2016-11-04 MED ORDER — WARFARIN SODIUM 10 MG PO TABS
10.0000 mg | ORAL_TABLET | Freq: Once | ORAL | Status: DC
  Filled 2016-11-04: qty 1

## 2016-11-04 NOTE — Discharge Instructions (Signed)
Need to follow INR in 2-3 days with PMD.

## 2016-11-04 NOTE — Progress Notes (Signed)
[place] for heparin drip and warfarin initiation Indication: DVT  No Known Allergies  Patient Measurements: Height: 6\' 3"  (190.5 cm) Weight: 256 lb (116.1 kg) IBW/kg (Calculated) : 84.5 Heparin Dosing Weight: 110.1 kg   Labs:  Recent Labs  11/02/16 0359 11/02/16 0945 11/03/16 0646 11/04/16 0516  HGB 13.2 12.8* 13.2  --   HCT 40.4 39.1* 40.6  --   PLT 110* 106* 116*  --   LABPROT 16.9*  --  19.4* 22.3*  INR 1.36  --  1.62 1.93  HEPARINUNFRC 0.54 0.58 0.51 0.45    Estimated Creatinine Clearance: 117.6 mL/min (by C-G formula based on SCr of 0.94 mg/dL). Lab Results  Component Value Date   INR 1.93 11/04/2016   INR 1.62 11/03/2016   INR 1.36 11/02/2016   Assessment: Patient admitted w/ bilateral leg pain, found to have occlusive DVT being started on heparin drip. Patient takes apixaban 5mg  bid at home.   Transitioning to warfarin due to apixaban failure.   Date INR Warfarin Dose 7/20 1.04 10mg  7/21 1.10 10mg  7/22 1.24 10mg  7/23 1.36 15 mg  7/24      1.62   Goal of Therapy:  Heparin level 0.3-0.7 units/ml corresponding Target aPTT is 66-102s Monitor platelets by anticoagulation protocol: Yes   Plan:  Heparin level therapeutic x2. Continue current rate. Recheck HL with AM labs.   7/25 AM heparin level 0.45 Continue current regimen. Recheck heparin level and CBC with tomorrow AM labs.  INR subtherapeutic, trending up. Will give warfarin to 15mg  this evening. Recheck INR with AM labs.   Plan to continue heparin until INR therapeutic x 48h.  Larene Beach, PharmD  Clinical Pharmacist  11/04/2016 5:50 AM

## 2016-11-04 NOTE — Progress Notes (Signed)
[place] for heparin drip and warfarin initiation Indication: DVT  No Known Allergies  Patient Measurements: Height: 6\' 3"  (190.5 cm) Weight: 256 lb (116.1 kg) IBW/kg (Calculated) : 84.5 Heparin Dosing Weight: 110.1 kg   Labs:  Recent Labs  11/02/16 0359 11/02/16 0945 11/03/16 0646 11/04/16 0516  HGB 13.2 12.8* 13.2 13.4  HCT 40.4 39.1* 40.6 41.0  PLT 110* 106* 116* 118*  LABPROT 16.9*  --  19.4* 22.3*  INR 1.36  --  1.62 1.93  HEPARINUNFRC 0.54 0.58 0.51 0.45  CREATININE  --   --   --  0.93    Estimated Creatinine Clearance: 118.9 mL/min (by C-G formula based on SCr of 0.93 mg/dL). Lab Results  Component Value Date   INR 1.93 11/04/2016   INR 1.62 11/03/2016   INR 1.36 11/02/2016   Assessment: Patient admitted w/ bilateral leg pain, found to have occlusive DVT being started on heparin drip. Patient takes apixaban 5mg  bid at home.   Transitioning to warfarin due to apixaban failure.   Date INR Warfarin Dose 7/20 1.04 10mg  7/21 1.10 10mg  7/22 1.24 10mg  7/23 1.36 15 mg  7/24      1.62  15 mg 7/25      1.93     Goal of Therapy:  Heparin level 0.3-0.7 units/ml corresponding Target aPTT is 66-102s Monitor platelets by anticoagulation protocol: Yes   Plan:  Heparin level therapeutic.Marland Kitchen Continue current rate. Recheck HL with AM labs.   INR subtherapeutic, trending up. Will give warfarin to 10mg  this evening. Recheck INR with AM labs. INR will likely be within goal range in the am.   Plan to continue heparin until INR therapeutic x 48h.  Larene Beach, PharmD  Clinical Pharmacist  11/04/2016 8:16 AM

## 2016-11-04 NOTE — Care Management (Signed)
No discharge needs identified by members of the care team 

## 2016-11-04 NOTE — Discharge Summary (Signed)
[facility] [place] NAME: Anthony Hart    MR#:  [id number]  DATE OF BIRTH:  [birth year]  DATE OF ADMISSION:  10/28/2016 ADMITTING PHYSICIAN: Saundra Shelling, MD  DATE OF DISCHARGE: 11/04/2016   PRIMARY CARE PHYSICIAN: [name], MD    ADMISSION DIAGNOSIS:  CLL (chronic lymphocytic leukemia) ([place]) [C91.90] Lower extremity pain, bilateral [M79.604, M79.605] Leg DVT (deep venous thromboembolism), acute, bilateral ([place]) [I82.403] Acute deep vein thrombosis (DVT) of proximal vein of both lower extremities (HCC) [I82.4Y3] DVT (deep vein thrombosis) in pregnancy ([place]) [O22.30, I82.409]  DISCHARGE DIAGNOSIS:  Principal Problem:   Leg DVT (deep venous thromboembolism), acute, bilateral (HCC) Active Problems:   CLL (chronic lymphocytic leukemia) (HCC)   Abnormal EKG   Hyperglycemia   SECONDARY DIAGNOSIS:   Past Medical History:  Diagnosis Date  . Cancer ([place])    CLL monitored   . Clotting disorder ([place])   . Diabetes mellitus without complication ([place])    Type II    HOSPITAL COURSE:   61 year old male with past medical history significant for CLL, diabetes mellitus, right leg DVT status post thrombectomy and on eliquis from March 2018 presents to hospital secondary to bilateral leg swelling and pain, worse on the left side.  #1 bilateral extensive DVT-patient has been on eliquis since March 2018 after diagnosis of his extensive right leg DVT. States that he has been compliant. -He is already status post IVC filter placement at that time. CT of the chest this admission is negative for any PE.  - Now bilateral DVT noted.  - s/p thrombectomy 10/30/16 - started on Coumadin after the procedure and bridging with IV heparin- waiting for therapeutic INR. - appreciate hematology consult  - INR is now 2.08- d/c with oral coumadin, and advised to recheck with PMD in 2-3 days.  #2 uncontrolled diabetes mellitus- in March 2018,  A1c of the time was 7.8. Only takes metformin at home. -Sugars are not adequately controlled. Hold metformin due to CT with contrast. -Started on Lantus and sliding scale insulin at this time. Rechecked A1c- still in process.  resume metformin on d/c.  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 Monitor, follow up as outpatient -could be the reason for hypercoagulable state. WBC is elevated but not significantly when compared to outpatient labs. - CT abd is negative for LN pathy.  #4 EKG changes-appreciate cardiology consult. Noted to be early repolarization changes. No acute intervention needed at this time.   #5 acute renal insufficiency-improved with fluids.   DISCHARGE CONDITIONS:   Stable.  CONSULTS OBTAINED:  Treatment Team:  [name], MD [name], MD  DRUG ALLERGIES:  No Known Allergies  DISCHARGE MEDICATIONS:   Current Discharge Medication List    START taking these medications   Details  warfarin (COUMADIN) 7.5 MG tablet Take 1 tablet (7.5 mg total) by mouth daily at 6 PM. Qty: 20 tablet, Refills: 0      CONTINUE these medications which have NOT CHANGED   Details  metFORMIN (GLUCOPHAGE-XR) 750 MG 24 hr tablet Take 750 mg by mouth daily.      STOP taking these medications     apixaban (ELIQUIS) 5 MG TABS tablet          DISCHARGE INSTRUCTIONS:    Follow and check INR in 2-3 days,.  If you experience worsening of your admission symptoms, develop shortness of breath, life threatening emergency, suicidal or homicidal thoughts you must seek medical attention immediately by calling  911 or calling your MD immediately  if symptoms less severe.  You Must read complete instructions/literature along with all the possible adverse reactions/side effects for all the Medicines you take and that have been prescribed to you. Take any new Medicines after you have completely understood and accept all the possible adverse reactions/side effects.   Please  note  You were cared for by a hospitalist during your hospital stay. If you have any questions about your discharge medications or the care you received while you were in the hospital after you are discharged, you can call the unit and asked to speak with the hospitalist on call if the hospitalist that took care of you is not available. Once you are discharged, your primary care physician will handle any further medical issues. Please note that NO REFILLS for any discharge medications will be authorized once you are discharged, as it is imperative that you return to your primary care physician (or establish a relationship with a primary care physician if you do not have one) for your aftercare needs so that they can reassess your need for medications and monitor your lab values.    Today   CHIEF COMPLAINT:   Chief Complaint  Patient presents with  . Leg Pain  . Weakness    HISTORY OF PRESENT ILLNESS:  Anthony Hart  is a 61 y.o. male with a known history of CLL which was being monitored but was never treated, diabetes, recent (March '18) right femoral DVT status post thrombectomy and on Eliquis presents to the emergency department for evaluation of bilateral leg pain.  Patient was in a usual state of health until about 5 days ago when he describes the onset of bilateral lower extremity pain, weakness and swelling. Patient states that he has been compliant with his Eliquis.  Patient denies fevers/chills, weakness, dizziness, chest pain, shortness of breath, N/V/C/D, abdominal pain, dysuria/frequency, changes in mental status.    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 Patient has been taking medication as prescribed and there has been no recent change in medication or diet.  No recent antibiotics.  There has been no recent illness, hospitalizations, travel or sick contacts.     VITAL SIGNS:  Blood pressure 103/63,  pulse 63, temperature 97.6 F (36.4 C), temperature source Oral, resp. rate 20, height 6\' 3"  (1.905 m), weight 116.1 kg (256 lb), SpO2 94 %.  I/O:   Intake/Output Summary (Last 24 hours) at 11/04/16 1215 Last data filed at 11/04/16 3151  Gross per 24 hour  Intake              624 ml  Output              900 ml  Net             -276 ml    PHYSICAL EXAMINATION:  GENERAL:  61 y.o.-year-old patient lying in the bed with no acute distress.  EYES: Pupils equal, round, reactive to light and accommodation. No scleral icterus. Extraocular muscles intact.  HEENT: Head atraumatic, normocephalic. Oropharynx and nasopharynx clear.  NECK:  Supple, no jugular venous distention. No thyroid enlargement, no tenderness.  LUNGS: Normal breath sounds bilaterally, no wheezing, rales,rhonchi or crepitation. No use of accessory muscles of respiration.  CARDIOVASCULAR: S1, S2 normal. No murmurs, rubs, or gallops.  ABDOMEN: Soft, non-tender, non-distended. Bowel sounds present. No organomegaly or mass.  EXTREMITIES: No pedal edema, cyanosis, or  clubbing.  NEUROLOGIC: Cranial nerves II through XII are intact. Muscle strength 5/5 in all extremities. Sensation intact. Gait not checked.  PSYCHIATRIC: The patient is alert and oriented x 3.  SKIN: No obvious rash, lesion, or ulcer.   DATA REVIEW:   CBC  Recent Labs Lab 11/04/16 0516  WBC 78.2*  HGB 13.4  HCT 41.0  PLT 118*    Chemistries   Recent Labs Lab 11/04/16 0516  NA 139  K 4.3  CL 108  CO2 24  GLUCOSE 156*  BUN 14  CREATININE 0.93  CALCIUM 9.0    Cardiac Enzymes  Recent Labs Lab 10/29/16 0121  TROPONINI <0.03    Microbiology Results  No results found for this or any previous visit.  RADIOLOGY:  No results found.  EKG:   Orders placed or performed during the hospital encounter of 10/28/16  . EKG 12-Lead  . EKG 12-Lead  . EKG 12-Lead  . EKG 12-Lead  . EKG 12-Lead      Management plans discussed with the patient,  family and they are in agreement.  CODE STATUS:     Code Status Orders        Start     Ordered   10/29/16 0211  Full code  Continuous     10/29/16 0210    Code Status History    Date Active Date Inactive Code Status Order ID Comments User Context   06/30/2016 11:35 PM 07/03/2016  7:43 PM Full Code [id number]  [name], MD Inpatient      TOTAL TIME TAKING CARE OF THIS PATIENT: 35 minutes.    Vaughan Basta M.D on 11/04/2016 at 12:15 PM  Between 7am to 6pm - Pager - [phone]  After 6pm go to [email or web address] - password [email or web address] [facility]  Office  [phone]  CC: Primary care physician; [name], MD   Note: This dictation was prepared with Dragon dictation along with smaller phrase technology. Any transcriptional errors that result from this process are unintentional.

## 2016-11-04 NOTE — Progress Notes (Signed)
Discharge instructions explained to pt / verbalized an understanding/ iv and tele removed/ RX given to pt/ will transport off unit via wheelchair when ride arrives 

## 2016-11-05 LAB — HEMOGLOBIN A1C
HEMOGLOBIN A1C: 11.8 % — AB (ref 4.8–5.6)
MEAN PLASMA GLUCOSE: 292 mg/dL

## 2016-11-17 ENCOUNTER — Encounter (INDEPENDENT_AMBULATORY_CARE_PROVIDER_SITE_OTHER): Payer: Medicaid Other | Admitting: Vascular Surgery

## 2017-01-11 NOTE — Progress Notes (Signed)
[place]  [phone] Fax:([phone]) [phone]  ID: Anthony Hart. OB: 05-18-1955  MR#: [id number]  OFB#:[id number]  Patient Care Team: [name], MD as PCP - General (Family Medicine)  CHIEF COMPLAINT: CLL, Rai stage 0, right femoral DVT.  INTERVAL HISTORY: Patient returns to clinic today for repeat laboratory work and further evaluation. He continues to feel well and is asymptomatic. He has no neurologic complaints. He denies any fevers or night sweats. He has a good appetite and denies weight loss. He denies any chest pain or shortness of breath. He denies any nausea, vomiting, constipation, or diarrhea. He has no melena or hematochezia. He has no urinary complaints. Patient offers no specific complaints today.  REVIEW OF SYSTEMS:   Review of Systems  Constitutional: Negative.  Negative for fever, malaise/fatigue and weight loss.  Respiratory: Negative.  Negative for cough and shortness of breath.   Cardiovascular: Negative.  Negative for chest pain and leg swelling.  Gastrointestinal: Negative.  Negative for abdominal pain.  Genitourinary: Negative.   Musculoskeletal: Negative.   Skin: Negative.  Negative for rash.  Neurological: Negative.  Negative for sensory change and weakness.  Psychiatric/Behavioral: Negative.  The patient is not nervous/anxious.     As per HPI. Otherwise, a complete review of systems is negative.  PAST MEDICAL HISTORY: Past Medical History:  Diagnosis Date  . Cancer ([place])    CLL monitored   . Clotting disorder ([place])   . Diabetes mellitus without complication ([place])    Type II    PAST SURGICAL HISTORY: Past Surgical History:  Procedure Laterality Date  . APPENDECTOMY    . COLONOSCOPY WITH PROPOFOL  02/04/2015   Procedure: COLONOSCOPY WITH PROPOFOL;  Surgeon: [name], MD;  [facility] END[facility]COPY;  Service: Gastroenterology;;  . ESOPHAGOGASTRODUODENOSCOPY (EGD) WITH PROPOFOL N/A 02/04/2015   Procedure:  ESOPHAGOGASTRODUODENOSCOPY (EGD) WITH PROPOFOL;  Surgeon: [name], MD;  Location: [facility] ENDOSCOPY;  Service: Gastroenterology;  Laterality: N/A;  . PERIPHERAL VASCULAR THROMBECTOMY Right 07/02/2016   Procedure: Peripheral Vascular Thrombectomy and IVC filter;  Surgeon: [name], MD;  Location: [place] CV LAB;  Service: Cardiovascular;  Laterality: Right;  . PERIPHERAL VASCULAR THROMBECTOMY Bilateral 10/30/2016   Procedure: Peripheral Vascular Thrombectomy;  Surgeon: [name], MD;  Location: [place] CV LAB;  Service: Cardiovascular;  Laterality: Bilateral;    FAMILY HISTORY Family History  Problem Relation Age of Onset  . Diabetes Mother   . Cancer Brother   . Cancer Maternal Aunt   . Stroke Maternal Uncle        ADVANCED DIRECTIVES:    HEALTH MAINTENANCE: Social History  Substance Use Topics  . Smoking status: Never Smoker  . Smokeless tobacco: Never Used  . Alcohol use No     Colonoscopy:  PAP:  Bone density:  Lipid panel:  No Known Allergies  Current Outpatient Prescriptions  Medication Sig Dispense Refill  . metFORMIN (GLUCOPHAGE-XR) 750 MG 24 hr tablet Take 750 mg by mouth daily.    Marland Kitchen warfarin (COUMADIN) 7.5 MG tablet Take 1 tablet (7.5 mg total) by mouth daily at 6 PM. 20 tablet 0   No current facility-administered medications for this visit.     OBJECTIVE: Vitals:   01/15/17 1100  BP: 108/70  Pulse: 67  Resp: 18  Temp: (!) 96.6 F (35.9 C)     Body mass index is 31.66 kg/m.    ECOG FS:0 - Asymptomatic  General: Well-developed, well-nourished, no acute distress. Eyes: Pink  conjunctiva, anicteric sclera. Lungs: Clear to auscultation bilaterally. Heart: Regular rate and rhythm. No rubs, murmurs, or gallops. Abdomen: Soft, nontender, nondistended. No organomegaly noted, normoactive bowel sounds. Musculoskeletal: No edema, cyanosis, or clubbing. Neuro: Alert, answering all questions appropriately. Cranial nerves grossly intact. Skin: No  rashes or petechiae noted. Psych: Normal affect. Lymphatics: No cervical, calvicular, axillary or inguinal LAD.   LAB RESULTS:  Lab Results  Component Value Date   NA 139 11/04/2016   K 4.3 11/04/2016   CL 108 11/04/2016   CO2 24 11/04/2016   GLUCOSE 156 (H) 11/04/2016   BUN 14 11/04/2016   CREATININE 0.93 11/04/2016   CALCIUM 9.0 11/04/2016   PROT 6.6 03/28/2015   ALBUMIN 4.0 03/28/2015   AST 28 03/28/2015   ALT 28 03/28/2015   ALKPHOS 60 03/28/2015   BILITOT 0.5 03/28/2015   GFRNONAA >60 11/04/2016   GFRAA >60 11/04/2016    Lab Results  Component Value Date   WBC 82.8 (HH) 01/15/2017   NEUTROABS 4.1 01/15/2017   HGB 14.4 01/15/2017   HCT 44.6 01/15/2017   MCV 88.7 01/15/2017   PLT 132 (L) 01/15/2017     STUDIES: No results found.  ASSESSMENT: CLL, Rai stage 0, right femoral DVT  PLAN:    1. CLL: Patient's most recent white blood cell count remains elevated at 82.6, but this has been approximately his baseline since March 2018. Previously his baseline was approximately 60 since 2015.  Patient was noted to have a CD7 aberrant T cell antigen on peripheral blood flow cytometry which occasionally can be considered poor prognosis. CT scan results from October 2015 revealed only mild lymphadenopathy. This only needs to be repeated if there is concern of progression of disease and treatment is necessary.  Patient does not require treatment at this time. He does not require bone marrow biopsy. Return to clinic in 6 months with repeat laboratory work and further evaluation.   2. 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 He has now discontinued driving. Full hypercoagulable workup was negative. His dRVVT was elevated, likely secondary to his anticoagulation. Patient has now completed greater than 6 months of anticoagulation and can discontinue treatment. He was educated on risk factors for developing another  DVT as well as instructed that he is at higher risk for developing a second blood clot over the general population. Return to clinic as above.   Approximately 30 minutes was spent in discussion of which greater than 50% was consultation.  Patient expressed understanding and was in agreement with this plan. He also understands that He can call clinic at any time with any questions, concerns, or complaints.     [name], MD   01/15/2017 11:22 AM

## 2017-01-15 ENCOUNTER — Other Ambulatory Visit: Payer: Self-pay

## 2017-01-15 ENCOUNTER — Inpatient Hospital Stay: Payer: Medicaid Other | Attending: Oncology | Admitting: Oncology

## 2017-01-15 ENCOUNTER — Inpatient Hospital Stay: Payer: Medicaid Other

## 2017-01-15 VITALS — BP 108/70 | HR 67 | Temp 96.6°F | Resp 18 | Wt 253.3 lb

## 2017-01-15 DIAGNOSIS — C9110 Chronic lymphocytic leukemia of B-cell type not having achieved remission: Secondary | ICD-10-CM | POA: Diagnosis present

## 2017-01-15 DIAGNOSIS — Z809 Family history of malignant neoplasm, unspecified: Secondary | ICD-10-CM | POA: Diagnosis not present

## 2017-01-15 DIAGNOSIS — I82411 Acute embolism and thrombosis of right femoral vein: Secondary | ICD-10-CM | POA: Diagnosis not present

## 2017-01-15 DIAGNOSIS — C9190 Lymphoid leukemia, unspecified not having achieved remission: Secondary | ICD-10-CM | POA: Insufficient documentation

## 2017-01-15 DIAGNOSIS — Z7901 Long term (current) use of anticoagulants: Secondary | ICD-10-CM | POA: Insufficient documentation

## 2017-01-15 DIAGNOSIS — E119 Type 2 diabetes mellitus without complications: Secondary | ICD-10-CM | POA: Insufficient documentation

## 2017-01-15 DIAGNOSIS — D689 Coagulation defect, unspecified: Secondary | ICD-10-CM | POA: Diagnosis not present

## 2017-01-15 DIAGNOSIS — Z7984 Long term (current) use of oral hypoglycemic drugs: Secondary | ICD-10-CM | POA: Diagnosis not present

## 2017-01-15 LAB — CBC WITH DIFFERENTIAL/PLATELET
BASOS ABS: 0 10*3/uL (ref 0–0.1)
Basophils Relative: 0 %
EOS ABS: 0 10*3/uL (ref 0–0.7)
Eosinophils Relative: 0 %
HCT: 44.6 % (ref 40.0–52.0)
Hemoglobin: 14.4 g/dL (ref 13.0–18.0)
LYMPHS ABS: 77.9 10*3/uL — AB (ref 1.0–3.6)
Lymphocytes Relative: 94 %
MCH: 28.7 pg (ref 26.0–34.0)
MCHC: 32.4 g/dL (ref 32.0–36.0)
MCV: 88.7 fL (ref 80.0–100.0)
Monocytes Absolute: 0.8 10*3/uL (ref 0.2–1.0)
Monocytes Relative: 1 %
NEUTROS ABS: 4.1 10*3/uL (ref 1.4–6.5)
NEUTROS PCT: 5 %
PLATELETS: 132 10*3/uL — AB (ref 150–440)
RBC: 5.02 MIL/uL (ref 4.40–5.90)
RDW: 16.8 % — AB (ref 11.5–14.5)
WBC: 82.8 10*3/uL — AB (ref 3.8–10.6)

## 2017-01-15 NOTE — Progress Notes (Signed)
Patient denies any concerns today.  

## 2017-05-17 ENCOUNTER — Encounter: Payer: Self-pay | Admitting: Emergency Medicine

## 2017-05-17 ENCOUNTER — Emergency Department
Admission: EM | Admit: 2017-05-17 | Discharge: 2017-05-17 | Disposition: A | Discharge status: Home / Self Care | Payer: Medicaid Other | Attending: Emergency Medicine | Admitting: Emergency Medicine

## 2017-05-17 ENCOUNTER — Other Ambulatory Visit: Payer: Self-pay

## 2017-05-17 DIAGNOSIS — Z7901 Long term (current) use of anticoagulants: Secondary | ICD-10-CM | POA: Insufficient documentation

## 2017-05-17 DIAGNOSIS — M5431 Sciatica, right side: Secondary | ICD-10-CM

## 2017-05-17 DIAGNOSIS — E119 Type 2 diabetes mellitus without complications: Secondary | ICD-10-CM | POA: Insufficient documentation

## 2017-05-17 DIAGNOSIS — Z7984 Long term (current) use of oral hypoglycemic drugs: Secondary | ICD-10-CM | POA: Insufficient documentation

## 2017-05-17 DIAGNOSIS — C9111 Chronic lymphocytic leukemia of B-cell type in remission: Secondary | ICD-10-CM | POA: Diagnosis not present

## 2017-05-17 DIAGNOSIS — Z86718 Personal history of other venous thrombosis and embolism: Secondary | ICD-10-CM | POA: Diagnosis not present

## 2017-05-17 DIAGNOSIS — M545 Low back pain: Secondary | ICD-10-CM | POA: Diagnosis present

## 2017-05-17 MED ORDER — DEXAMETHASONE SODIUM PHOSPHATE 10 MG/ML IJ SOLN
10.0000 mg | Freq: Once | INTRAMUSCULAR | Status: AC
  Administered 2017-05-17: 10 mg via INTRAMUSCULAR
  Filled 2017-05-17: qty 1

## 2017-05-17 MED ORDER — PREDNISONE 10 MG PO TABS: ORAL_TABLET | ORAL | 0 refills | Status: DC

## 2017-05-17 MED ORDER — METHOCARBAMOL 500 MG PO TABS: 500.0000 mg | ORAL_TABLET | Freq: Four times a day (QID) | ORAL | 0 refills | Status: DC | PRN

## 2017-05-17 NOTE — ED Notes (Signed)
See triage note  States he developed some right sided back pain last Friday  States pain started in groin on Friday  Today pain is from lower back into right leg  Ambulates with slight limp d.t pain

## 2017-05-17 NOTE — Discharge Instructions (Addendum)
Begin taking medication as directed.  Follow-up with your primary care provider or Dr. Marry Guan who is on-call for orthopedics in the [facility].  Take medication only as directed.  Ice or heat to your lower back as needed for discomfort.  Do not drive while taking muscle relaxant which is methocarbamol.

## 2017-05-17 NOTE — ED Triage Notes (Signed)
Low back pain radiating down R leg x 4 days. 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

## 2017-05-17 NOTE — ED Provider Notes (Signed)
[facility] Emergency Department Provider Note  ____________________________________________   First MD Initiated Contact with Patient 05/17/17 [phone]     (approximate)  I have reviewed the triage vital signs and the nursing notes.   HISTORY  Chief Complaint Back Pain   HPI Anthony Massi. is a 62 y.o. male is here with complaint of low back pain radiating down his right leg for the last 4 days.  Patient states that there is been no specific injury.  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  He denies any urinary symptoms or history of hematuria.  Patient has continued to be ambulatory without assistance.  He has not taken any over-the-counter medication for this.  States that he no longer takes Coumadin as indicated in his chart.  He has seen the orthopedic department at [facility] for this same type of back pain which was relieved with a "shot".  Currently rates his pain as 10/10.  Past Medical History:  Diagnosis Date  . Cancer ([place])    CLL monitored   . Clotting disorder ([place])   . Diabetes mellitus without complication ([place])    Type II    Patient Active Problem List   Diagnosis Date Noted  . Abnormal EKG 10/29/2016  . Hyperglycemia 10/29/2016  . Leg DVT (deep venous thromboembolism), acute, bilateral ([place]) 10/28/2016  . CLL (chronic lymphocytic leukemia) ([place]) 07/07/2016  . DVT (deep venous thrombosis) ([place]) 06/30/2016    Past Surgical History:  Procedure Laterality Date  . APPENDECTOMY    . COLONOSCOPY WITH PROPOFOL  02/04/2015   Procedure: COLONOSCOPY WITH PROPOFOL;  Surgeon: [name], MD;  Location: [facility] ENDOSCOPY;  Service: Gastroenterology;;  . ESOPHAGOGASTRODUODENOSCOPY (EGD) WITH PROPOFOL N/A 02/04/2015   Procedure: ESOPHAGOGASTRODUODENOSCOPY (EGD) WITH PROPOFOL;  Surgeon: [name], MD;  Location: [facility] ENDOSCOPY;  Service: Gastroenterology;  Laterality: N/A;  . PERIPHERAL VASCULAR THROMBECTOMY Right 07/02/2016   Procedure:  Peripheral Vascular Thrombectomy and IVC filter;  Surgeon: [name], MD;  Location: [place] CV LAB;  Service: Cardiovascular;  Laterality: Right;  . PERIPHERAL VASCULAR THROMBECTOMY Bilateral 10/30/2016   Procedure: Peripheral Vascular Thrombectomy;  Surgeon: [name], MD;  Location: [place] CV LAB;  Service: Cardiovascular;  Laterality: Bilateral;    Prior to Admission medications   Medication Sig Start Date End Date Taking? Authorizing Provider  metFORMIN (GLUCOPHAGE-XR) 750 MG 24 hr tablet Take 750 mg by mouth daily. 03/09/16 03/09/17  [provider]  methocarbamol (ROBAXIN) 500 MG tablet Take 1 tablet (500 mg total) by mouth every 6 (six) hours as needed for muscle spasms. 05/17/17   [name], PA-C  predniSONE (DELTASONE) 10 MG tablet Take 6 tablets  today, on day 2 take 5 tablets, day 3 take 4 tablets, day 4 take 3 tablets, day 5 take  2 tablets and 1 tablet the last day 05/17/17   [name], PA-C  warfarin (COUMADIN) 7.5 MG tablet Take 1 tablet (7.5 mg total) by mouth daily at 6 PM. 11/04/16   Vaughan Basta, MD    Allergies Patient has no known allergies.  Family History  Problem Relation Age of Onset  . Diabetes Mother   . Cancer Brother   . Cancer Maternal Aunt   . Stroke Maternal Uncle     Social History Social History   Tobacco Use  . Smoking status: Never Smoker  . Smokeless tobacco: Never Used  Substance Use Topics  . Alcohol use: No  .  Drug use: No    Review of Systems Constitutional: No fever/chills Cardiovascular: Denies chest pain. Respiratory: Denies shortness of breath. Gastrointestinal: No abdominal pain.  No nausea, no vomiting.  Genitourinary: Negative for dysuria. Musculoskeletal: Positive for low back pain with right leg radiculopathy. Skin: Negative for rash. Neurological: Negative for headaches, focal weakness or numbness. ____________________________________________   PHYSICAL EXAM:  VITAL  SIGNS: ED Triage Vitals  Enc Vitals Group     BP 05/17/17 0823 113/73     Pulse Rate 05/17/17 0823 79     Resp 05/17/17 0823 18     Temp 05/17/17 0823 97.7 F (36.5 C)     Temp Source 05/17/17 0823 Oral     SpO2 05/17/17 0823 95 %     Weight 05/17/17 0823 256 lb (116.1 kg)     Height 05/17/17 0823 6\' 3"  (1.905 m)     Head Circumference --      Peak Flow --      Pain Score 05/17/17 0825 10     Pain Loc --      Pain Edu? --      Excl. in [place]? --    Constitutional: Alert and oriented. Well appearing and in no acute distress. Eyes: Conjunctivae are normal.  Head: Atraumatic. Nose: No congestion/rhinnorhea. Neck: No stridor.   Cardiovascular: Normal rate, regular rhythm. Grossly normal heart sounds.  Good peripheral circulation. Respiratory: Normal respiratory effort.  No retractions. Lungs CTAB. Gastrointestinal: Soft and nontender. No distention.  Musculoskeletal: Examination of the back there is no gross deformity noted.  There is minimal tenderness on palpation of the thoracic or lumbar spine.  There is tenderness on palpation of the right SI joint area and surrounding tissue.  There is no edema or abnormality in comparison with the left.  Patient is ambulatory without assistance.  Good muscle strength bilaterally.  Straight leg raises are negative. Neurologic:  Normal speech and language. No gross focal neurologic deficits are appreciated.  Reflexes are 1+ bilaterally. Skin:  Skin is warm, dry and intact. No rash noted. Psychiatric: Mood and affect are normal. Speech and behavior are normal.  ____________________________________________   LABS (all labs ordered are listed, but only abnormal results are displayed)  Labs Reviewed - No data to display   PROCEDURES  Procedure(s) performed: None  Procedures  Critical Care performed: No  ____________________________________________   INITIAL IMPRESSION / ASSESSMENT AND PLAN / ED COURSE  Patient was given Decadron 10 mg  IM in the department today.  He was also placed on a tapering dose of prednisone.  Patient is encouraged to follow-up with his PCP if any continued problems and to make an appointment with [facility] orthopedic department if he continues to have problems.  ____________________________________________   FINAL CLINICAL IMPRESSION(S) / ED DIAGNOSES  Final diagnoses:  Sciatica of right side     ED Discharge Orders        Ordered    predniSONE (DELTASONE) 10 MG tablet     05/17/17 1103    methocarbamol (ROBAXIN) 500 MG tablet  Every 6 hours PRN     05/17/17 1103       Note:  This document was prepared using Dragon voice recognition software and may include unintentional dictation errors.    [name], PA-C 05/17/17 1553    Earleen Newport, MD 05/18/17 [phone]

## 2017-05-19 ENCOUNTER — Inpatient Hospital Stay
Admission: EM | Admit: 2017-05-19 | Discharge: 2017-05-25 | DRG: 270 | Disposition: A | Discharge status: Home / Self Care | Payer: Medicaid Other | Attending: Family Medicine | Admitting: Family Medicine

## 2017-05-19 ENCOUNTER — Emergency Department: Payer: Medicaid Other

## 2017-05-19 ENCOUNTER — Encounter: Payer: Self-pay | Admitting: Emergency Medicine

## 2017-05-19 DIAGNOSIS — Y95 Nosocomial condition: Secondary | ICD-10-CM | POA: Diagnosis present

## 2017-05-19 DIAGNOSIS — Z86711 Personal history of pulmonary embolism: Secondary | ICD-10-CM

## 2017-05-19 DIAGNOSIS — R509 Fever, unspecified: Secondary | ICD-10-CM

## 2017-05-19 DIAGNOSIS — I82401 Acute embolism and thrombosis of unspecified deep veins of right lower extremity: Principal | ICD-10-CM | POA: Diagnosis present

## 2017-05-19 DIAGNOSIS — Z86718 Personal history of other venous thrombosis and embolism: Secondary | ICD-10-CM

## 2017-05-19 DIAGNOSIS — C9110 Chronic lymphocytic leukemia of B-cell type not having achieved remission: Secondary | ICD-10-CM | POA: Diagnosis present

## 2017-05-19 DIAGNOSIS — Z7984 Long term (current) use of oral hypoglycemic drugs: Secondary | ICD-10-CM | POA: Diagnosis not present

## 2017-05-19 DIAGNOSIS — R600 Localized edema: Secondary | ICD-10-CM | POA: Diagnosis not present

## 2017-05-19 DIAGNOSIS — M79604 Pain in right leg: Secondary | ICD-10-CM | POA: Diagnosis not present

## 2017-05-19 DIAGNOSIS — Z79899 Other long term (current) drug therapy: Secondary | ICD-10-CM

## 2017-05-19 DIAGNOSIS — J189 Pneumonia, unspecified organism: Secondary | ICD-10-CM | POA: Diagnosis not present

## 2017-05-19 DIAGNOSIS — Z23 Encounter for immunization: Secondary | ICD-10-CM | POA: Diagnosis not present

## 2017-05-19 DIAGNOSIS — I82409 Acute embolism and thrombosis of unspecified deep veins of unspecified lower extremity: Secondary | ICD-10-CM | POA: Diagnosis present

## 2017-05-19 DIAGNOSIS — I80201 Phlebitis and thrombophlebitis of unspecified deep vessels of right lower extremity: Secondary | ICD-10-CM | POA: Diagnosis present

## 2017-05-19 DIAGNOSIS — E119 Type 2 diabetes mellitus without complications: Secondary | ICD-10-CM | POA: Diagnosis not present

## 2017-05-19 DIAGNOSIS — Z7901 Long term (current) use of anticoagulants: Secondary | ICD-10-CM

## 2017-05-19 DIAGNOSIS — I824Z1 Acute embolism and thrombosis of unspecified deep veins of right distal lower extremity: Secondary | ICD-10-CM

## 2017-05-19 LAB — COMPREHENSIVE METABOLIC PANEL
ALK PHOS: 53 U/L (ref 38–126)
ALT: 16 U/L — AB (ref 17–63)
ANION GAP: 10 (ref 5–15)
AST: 19 U/L (ref 15–41)
Albumin: 3.9 g/dL (ref 3.5–5.0)
BILIRUBIN TOTAL: 1 mg/dL (ref 0.3–1.2)
BUN: 18 mg/dL (ref 6–20)
CALCIUM: 8.7 mg/dL — AB (ref 8.9–10.3)
CO2: 26 mmol/L (ref 22–32)
CREATININE: 0.98 mg/dL (ref 0.61–1.24)
Chloride: 103 mmol/L (ref 101–111)
Glucose, Bld: 135 mg/dL — ABNORMAL HIGH (ref 65–99)
Potassium: 5 mmol/L (ref 3.5–5.1)
Sodium: 139 mmol/L (ref 135–145)
TOTAL PROTEIN: 7.1 g/dL (ref 6.5–8.1)

## 2017-05-19 LAB — CBC WITH DIFFERENTIAL/PLATELET
BASOS ABS: 0 10*3/uL (ref 0–0.1)
BASOS PCT: 0 %
EOS ABS: 0 10*3/uL (ref 0–0.7)
Eosinophils Relative: 0 %
HCT: 42.6 % (ref 40.0–52.0)
Hemoglobin: 13.5 g/dL (ref 13.0–18.0)
LYMPHS PCT: 89 %
Lymphs Abs: 85.3 10*3/uL — ABNORMAL HIGH (ref 1.0–3.6)
MCH: 28.6 pg (ref 26.0–34.0)
MCHC: 31.8 g/dL — ABNORMAL LOW (ref 32.0–36.0)
MCV: 90.1 fL (ref 80.0–100.0)
MONO ABS: 1.9 10*3/uL — AB (ref 0.2–1.0)
Monocytes Relative: 2 %
NEUTROS PCT: 9 %
Neutro Abs: 8.6 10*3/uL — ABNORMAL HIGH (ref 1.4–6.5)
PLATELETS: 112 10*3/uL — AB (ref 150–440)
RBC: 4.72 MIL/uL (ref 4.40–5.90)
RDW: 15.4 % — ABNORMAL HIGH (ref 11.5–14.5)
WBC: 95.8 10*3/uL — AB (ref 3.8–10.6)

## 2017-05-19 LAB — TROPONIN I

## 2017-05-19 LAB — PROTIME-INR
INR: 1.13
PROTHROMBIN TIME: 14.4 s (ref 11.4–15.2)

## 2017-05-19 MED ORDER — HEPARIN BOLUS VIA INFUSION
4000.0000 [IU] | Freq: Once | INTRAVENOUS | Status: AC
  Administered 2017-05-19: 4000 [IU] via INTRAVENOUS
  Filled 2017-05-19: qty 4000

## 2017-05-19 MED ORDER — MORPHINE SULFATE (PF) 4 MG/ML IV SOLN
6.0000 mg | Freq: Once | INTRAVENOUS | Status: AC
  Administered 2017-05-19: 6 mg via INTRAVENOUS
  Filled 2017-05-19: qty 2

## 2017-05-19 MED ORDER — HEPARIN (PORCINE) IN NACL 100-0.45 UNIT/ML-% IJ SOLN
2600.0000 [IU]/h | INTRAMUSCULAR | Status: DC
  Administered 2017-05-19: 1400 [IU]/h via INTRAVENOUS
  Administered 2017-05-20 – 2017-05-22 (×4): 1800 [IU]/h via INTRAVENOUS
  Administered 2017-05-22: 2050 [IU]/h via INTRAVENOUS
  Administered 2017-05-23 – 2017-05-24 (×3): 2450 [IU]/h via INTRAVENOUS
  Administered 2017-05-25: 2600 [IU]/h via INTRAVENOUS
  Administered 2017-05-25: 2450 [IU]/h via INTRAVENOUS
  Filled 2017-05-19 (×12): qty 250

## 2017-05-19 MED ORDER — ONDANSETRON HCL 4 MG/2ML IJ SOLN
4.0000 mg | Freq: Once | INTRAMUSCULAR | Status: AC
  Administered 2017-05-19: 4 mg via INTRAVENOUS
  Filled 2017-05-19: qty 2

## 2017-05-19 NOTE — ED Notes (Addendum)
Pt states that he came to ER on Monday and was told that he had sciatica and sent home with pain meds. He came back because he is still in pain and right leg is still swollen. Pt says his right hip and testicle is also hurting. Wife at bedside. Pt reports he can "hardly walk".

## 2017-05-19 NOTE — ED Triage Notes (Signed)
Pt comes into the ED via POV c/o right leg pain and swelling.  Patient has h/o 2 blood clots in the past and states this feels similar.  Patient states there is not heat, but swelling is present.  Two filters have been placed in the leg from his previous clots.  Patient states the pain is worse with ambulation.  Patient has even and unlabored respirations and in NAD at this time.

## 2017-05-19 NOTE — ED Notes (Signed)
Date and time results received: 05/19/17 2248 (use smartphrase ".now" to insert current time)  Test: WBC Critical Value: 95.8  Name of Provider Notified: Dr. Alfred Levins  Orders Received? Or Actions Taken?:

## 2017-05-19 NOTE — H&P (Signed)
[place] [facility] [place] NAME: Anthony Hart    MR#:  [id number]  DATE OF BIRTH:  [birth year]  DATE OF ADMISSION:  05/19/2017  PRIMARY CARE PHYSICIAN: [name], MD   REQUESTING/REFERRING PHYSICIAN: Alfred Levins, MD  CHIEF COMPLAINT:   Chief Complaint  Patient presents with  . Leg Pain    HISTORY OF PRESENT ILLNESS:  Anthony Hart  is a 62 y.o. male who presents with swelling of his right lower extremity.  Patient has a history of recurring DVTs.  Ultrasound tonight shows DVT in that leg.  Given the extensive nature of his DVT, heparin drip started and hospitalist called for admission  PAST MEDICAL HISTORY:   Past Medical History:  Diagnosis Date  . Cancer ([place])    CLL monitored   . Clotting disorder ([place])   . Diabetes mellitus without complication ([place])    Type II    PAST SURGICAL HISTORY:   Past Surgical History:  Procedure Laterality Date  . APPENDECTOMY    . COLONOSCOPY WITH PROPOFOL  02/04/2015   Procedure: COLONOSCOPY WITH PROPOFOL;  Surgeon: [name], MD;  [facility] END[facility]COPY;  Service: Gastroenterology;;  . ESOPHAGOGASTRODUODENOSCOPY (EGD) WITH PROPOFOL N/A 02/04/2015   Procedure: ESOPHAGOGASTRODUODENOSCOPY (EGD) WITH PROPOFOL;  Surgeon: [name], MD;  Location: [facility] ENDOSCOPY;  Service: Gastroenterology;  Laterality: N/A;  . PERIPHERAL VASCULAR THROMBECTOMY Right 07/02/2016   Procedure: Peripheral Vascular Thrombectomy and IVC filter;  Surgeon: [name], MD;  Location: [place] CV LAB;  Service: Cardiovascular;  Laterality: Right;  . PERIPHERAL VASCULAR THROMBECTOMY Bilateral 10/30/2016   Procedure: Peripheral Vascular Thrombectomy;  Surgeon: [name], MD;  Location: [place] CV LAB;  Service: Cardiovascular;  Laterality: Bilateral;    SOCIAL HISTORY:   Social History   Tobacco Use  . Smoking status: Never Smoker  . Smokeless tobacco: Never Used  Substance Use Topics  . Alcohol use:  No    FAMILY HISTORY:   Family History  Problem Relation Age of Onset  . Diabetes Mother   . Cancer Brother   . Cancer Maternal Aunt   . Stroke Maternal Uncle     DRUG ALLERGIES:  No Known Allergies  MEDICATIONS AT HOME:   Prior to Admission medications   Medication Sig Start Date End Date Taking? Authorizing Provider  metFORMIN (GLUCOPHAGE-XR) 750 MG 24 hr tablet Take 750 mg by mouth daily. 03/09/16 05/19/17 Yes [provider]  methocarbamol (ROBAXIN) 500 MG tablet Take 1 tablet (500 mg total) by mouth every 6 (six) hours as needed for muscle spasms. Patient not taking: Reported on 05/19/2017 05/17/17   [name], PA-C  predniSONE (DELTASONE) 10 MG tablet Take 6 tablets  today, on day 2 take 5 tablets, day 3 take 4 tablets, day 4 take 3 tablets, day 5 take  2 tablets and 1 tablet the last day Patient not taking: Reported on 05/19/2017 05/17/17   [name], PA-C  warfarin (COUMADIN) 7.5 MG tablet Take 1 tablet (7.5 mg total) by mouth daily at 6 PM. Patient not taking: Reported on 05/19/2017 11/04/16   Vaughan Basta, MD    REVIEW OF SYSTEMS:  Review of Systems  Constitutional: Negative for chills, fever, malaise/fatigue and weight loss.  HENT: Negative for ear pain, hearing loss and tinnitus.   Eyes: Negative for blurred vision, double vision, pain and redness.  Respiratory: Negative for cough, hemoptysis and shortness of breath.   Cardiovascular: Negative for chest pain,  palpitations, orthopnea and leg swelling.  Gastrointestinal: Negative for abdominal pain, constipation, diarrhea, nausea and vomiting.  Genitourinary: Negative for dysuria, frequency and hematuria.  Musculoskeletal: Negative for back pain, joint pain and neck pain.       Right lower extremity pain and swelling  Skin:       No acne, rash, or lesions  Neurological: Negative for dizziness, tremors, focal weakness and weakness.  Endo/Heme/Allergies: Negative for polydipsia. Does not  bruise/bleed easily.  Psychiatric/Behavioral: Negative for depression. The patient is not nervous/anxious and does not have insomnia.      VITAL SIGNS:   Vitals:   05/19/17 1929 05/19/17 1931 05/19/17 2149 05/19/17 2230  BP:  133/77 120/69 119/75  Pulse:  76 60 62  Resp:  20 18 (!) 9  Temp:  98.8 F (37.1 C)    TempSrc:  Oral    SpO2:  98% 98% 96%  Weight: 116.1 kg (256 lb)     Height: 6\' 3"  (1.905 m)      Wt Readings from Last 3 Encounters:  05/19/17 116.1 kg (256 lb)  05/17/17 116.1 kg (256 lb)  01/15/17 114.9 kg (253 lb 4.9 oz)    PHYSICAL EXAMINATION:  Physical Exam  Vitals reviewed. Constitutional: He is oriented to person, place, and time. He appears well-developed and well-nourished. No distress.  HENT:  Head: Normocephalic and atraumatic.  Mouth/Throat: Oropharynx is clear and moist.  Eyes: Conjunctivae and EOM are normal. Pupils are equal, round, and reactive to light. No scleral icterus.  Neck: Normal range of motion. Neck supple. No JVD present. No thyromegaly present.  Cardiovascular: Normal rate, regular rhythm and intact distal pulses. Exam reveals no gallop and no friction rub.  No murmur heard. Respiratory: Effort normal and breath sounds normal. No respiratory distress. He has no wheezes. He has no rales.  GI: Soft. Bowel sounds are normal. He exhibits no distension. There is no tenderness.  Musculoskeletal: Normal range of motion. He exhibits edema (And swelling of his right lower extremity).  No arthritis, no gout  Lymphadenopathy:    He has no cervical adenopathy.  Neurological: He is alert and oriented to person, place, and time. No cranial nerve deficit.  No dysarthria, no aphasia  Skin: Skin is warm and dry. No rash noted. No erythema.  Psychiatric: He has a normal mood and affect. His behavior is normal. Judgment and thought content normal.    LABORATORY PANEL:   CBC No results for input(s): WBC, HGB, HCT, PLT in the last 168  hours. ------------------------------------------------------------------------------------------------------------------  Chemistries  Recent Labs  Lab 05/19/17 2154  NA 139  K 5.0  CL 103  CO2 26  GLUCOSE 135*  BUN 18  CREATININE 0.98  CALCIUM 8.7*  AST 19  ALT 16*  ALKPHOS 83  BILITOT 1.0   ------------------------------------------------------------------------------------------------------------------  Cardiac Enzymes Recent Labs  Lab 05/19/17 2154  TROPONINI <0.03   ------------------------------------------------------------------------------------------------------------------  RADIOLOGY:  US Venous Img Lower Unilateral Right  Result Date: 05/19/2017 CLINICAL DATA:  Pain and swelling in RIGHT lower extremity, prior history of DVT and pulmonary embolism EXAM: RIGHT LOWER EXTREMITY VENOUS DOPPLER ULTRASOUND TECHNIQUE: Gray-scale sonography with graded compression, as well as color Doppler and duplex ultrasound were performed to evaluate the lower extremity deep venous systems from the level of the common femoral vein and including the common femoral, femoral, profunda femoral, popliteal and calf veins including the posterior tibial, peroneal and gastrocnemius veins when visible. The superficial great saphenous vein was also interrogated. Spectral Doppler was utilized  to evaluate flow at rest and with distal augmentation maneuvers in the common femoral, femoral and popliteal veins. COMPARISON:  None. FINDINGS: Contralateral Common Femoral Vein: Respiratory phasicity is normal and symmetric with the symptomatic side. No evidence of thrombus. Normal compressibility. Common Femoral Vein: Occlusive hypoechoic thrombus, new. Noncompressible. Saphenofemoral Junction: Thrombus extending into greater saphenous vein confluence Profunda Femoral Vein: Occlusive hypoechoic thrombus, new. Noncompressible. Femoral Vein: Occlusive hypoechoic thrombus, new.  Noncompressible. Popliteal Vein:  Occlusive hypoechoic thrombus, new. Noncompressible. Calf Veins: Unable to adequately visualize due to body habitus and swelling Superficial Great Saphenous Vein: Thrombus identified within greater saphenous vein at the RIGHT thigh. Venous Reflux:  N/A Other Findings:  N/A IMPRESSION: Extensive acute appearing deep venous thrombosis in the RIGHT lower extremity as above, new since 06/30/2016. Electronically Signed   By: Lavonia Dana M.D.   On: 05/19/2017 20:32    EKG:   Orders placed or performed during the hospital encounter of 05/19/17  . ED EKG  . 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 Management plans discussed with the patient and/or family.  DVT PROPHYLAXIS: Systemic anticoagulation  GI PROPHYLAXIS: None  ADMISSION STATUS: Inpatient  CODE STATUS: Full Code Status History    Date Active Date Inactive Code Status Order ID Comments User Context   10/29/2016 02:11 11/04/2016 19:43 Full Code [id number]  Harvie Bridge, DO ED   06/30/2016 23:35 07/03/2016 19:43 Full Code [id number]  [name], MD Inpatient      TOTAL TIME TAKING CARE OF THIS PATIENT: 45 minutes.   Anthony Hart [place] 05/19/2017, 11:08 PM  [employer]  [phone]  CC: Primary care physician; [name], MD  Note:  This document was prepared using Dragon voice recognition software and may include unintentional dictation errors.

## 2017-05-19 NOTE — ED Notes (Signed)
Gave pt food tray. 

## 2017-05-19 NOTE — ED Provider Notes (Signed)
[facility] Emergency Department Provider Note  ____________________________________________  Time seen: Approximately 9:56 PM  I have reviewed the triage vital signs and the nursing notes.   HISTORY  Chief Complaint Leg Pain   HPI Anthony Hart. is a 62 y.o. male with a history of CLL, DVT and no longer on Coumadin since October 2018, DM who presents for evaluation of right leg swelling and pain.Patient has had 4 days of progressively worsening swelling and pain of his right lower extremity. Currently complaining of 8 out of 10 pressure-like pain, constant and nonradiating, including the entire right lower extremity. He denies chest pain or shortness of breath. No prior history of PE.  Past Medical History:  Diagnosis Date  . Cancer ([place])    CLL monitored   . Clotting disorder ([place])   . Diabetes mellitus without complication ([place])    Type II    Patient Active Problem List   Diagnosis Date Noted  . Diabetes ([place]) 05/19/2017  . Abnormal EKG 10/29/2016  . Hyperglycemia 10/29/2016  . Leg DVT (deep venous thromboembolism), acute, bilateral ([place]) 10/28/2016  . CLL (chronic lymphocytic leukemia) ([place]) 07/07/2016  . Recurrent acute deep vein thrombosis (DVT) of lower extremity ([place]) 06/30/2016    Past Surgical History:  Procedure Laterality Date  . APPENDECTOMY    . COLONOSCOPY WITH PROPOFOL  02/04/2015   Procedure: COLONOSCOPY WITH PROPOFOL;  Surgeon: [name], MD;  Location: [facility] ENDOSCOPY;  Service: Gastroenterology;;  . ESOPHAGOGASTRODUODENOSCOPY (EGD) WITH PROPOFOL N/A 02/04/2015   Procedure: ESOPHAGOGASTRODUODENOSCOPY (EGD) WITH PROPOFOL;  Surgeon: [name], MD;  Location: [facility] ENDOSCOPY;  Service: Gastroenterology;  Laterality: N/A;  . PERIPHERAL VASCULAR THROMBECTOMY Right 07/02/2016   Procedure: Peripheral Vascular Thrombectomy and IVC filter;  Surgeon: [name], MD;  Location: [place] CV LAB;  Service: Cardiovascular;   Laterality: Right;  . PERIPHERAL VASCULAR THROMBECTOMY Bilateral 10/30/2016   Procedure: Peripheral Vascular Thrombectomy;  Surgeon: [name], MD;  Location: [place] CV LAB;  Service: Cardiovascular;  Laterality: Bilateral;    Prior to Admission medications   Medication Sig Start Date End Date Taking? Authorizing Provider  metFORMIN (GLUCOPHAGE-XR) 750 MG 24 hr tablet Take 750 mg by mouth daily. 03/09/16 05/19/17 Yes [provider]  methocarbamol (ROBAXIN) 500 MG tablet Take 1 tablet (500 mg total) by mouth every 6 (six) hours as needed for muscle spasms. Patient not taking: Reported on 05/19/2017 05/17/17   [name], PA-C  predniSONE (DELTASONE) 10 MG tablet Take 6 tablets  today, on day 2 take 5 tablets, day 3 take 4 tablets, day 4 take 3 tablets, day 5 take  2 tablets and 1 tablet the last day Patient not taking: Reported on 05/19/2017 05/17/17   [name], PA-C  warfarin (COUMADIN) 7.5 MG tablet Take 1 tablet (7.5 mg total) by mouth daily at 6 PM. Patient not taking: Reported on 05/19/2017 11/04/16   Vaughan Basta, MD    Allergies Patient has no known allergies.  Family History  Problem Relation Age of Onset  . Diabetes Mother   . Cancer Brother   . Cancer Maternal Aunt   . Stroke Maternal Uncle     Social History Social History   Tobacco Use  . Smoking status: Never Smoker  . Smokeless tobacco: Never Used  Substance Use Topics  . Alcohol use: No  . Drug use: No    Review of Systems  Constitutional: Negative for fever. Eyes: Negative for visual changes.  ENT: Negative for sore throat. Neck: No neck pain  Cardiovascular: Negative for chest pain. Respiratory: Negative for shortness of breath. Gastrointestinal: Negative for abdominal pain, vomiting or diarrhea. Genitourinary: Negative for dysuria. Musculoskeletal: Negative for back pain. + RLE pain and swelling Skin: Negative for rash. Neurological: Negative for headaches, weakness or  numbness. Psych: No SI or HI  ____________________________________________   PHYSICAL EXAM:  VITAL SIGNS: ED Triage Vitals  Enc Vitals Group     BP 05/19/17 1931 133/77     Pulse Rate 05/19/17 1931 76     Resp 05/19/17 1931 20     Temp 05/19/17 1931 98.8 F (37.1 C)     Temp Source 05/19/17 1931 Oral     SpO2 05/19/17 1931 98 %     Weight 05/19/17 1929 256 lb (116.1 kg)     Height 05/19/17 1929 6\' 3"  (1.905 m)     Head Circumference --      Peak Flow --      Pain Score 05/19/17 2148 9     Pain Loc --      Pain Edu? --      Excl. in [place]? --     Constitutional: Alert and oriented. Well appearing and in no apparent distress. HEENT:      Head: Normocephalic and atraumatic.         Eyes: Conjunctivae are normal. Sclera is non-icteric.       Mouth/Throat: Mucous membranes are moist.       Neck: Supple with no signs of meningismus. Cardiovascular: Regular rate and rhythm. No murmurs, gallops, or rubs. 2+ symmetrical distal pulses are present in all extremities. No JVD. Respiratory: Normal respiratory effort. Lungs are clear to auscultation bilaterally. No wheezes, crackles, or rhonchi.  Gastrointestinal: Soft, non tender, and non distended with positive bowel sounds. No rebound or guarding. Musculoskeletal: Right lower extremity is markedly swollen and erythematous, no warmth, strong distal pulse  Neurologic: Normal speech and language. Face is symmetric. Moving all extremities. No gross focal neurologic deficits are appreciated. Skin: Skin is warm, dry and intact. No rash noted. Psychiatric: Mood and affect are normal. Speech and behavior are normal.  ____________________________________________   LABS (all labs ordered are listed, but only abnormal results are displayed)  Labs Reviewed  COMPREHENSIVE METABOLIC PANEL - Abnormal; Notable for the following components:      Result Value   Glucose, Bld 135 (*)    Calcium 8.7 (*)    ALT 16 (*)    All other components within  normal limits  CBC WITH DIFFERENTIAL/PLATELET - Abnormal; Notable for the following components:   WBC 95.8 (*)    MCHC 31.8 (*)    RDW 15.4 (*)    Platelets 112 (*)    Neutro Abs 8.6 (*)    Lymphs Abs 85.3 (*)    Monocytes Absolute 1.9 (*)    All other components within normal limits  PROTIME-INR  TROPONIN I  HEPARIN LEVEL (UNFRACTIONATED)  CBC   ____________________________________________  EKG  ED ECG REPORT I, Rudene Re, the attending physician, personally viewed and interpreted this ECG.  Normal sinus rhythm, rate of 65, normal intervals, normal axis, diffuse mild ST elevation with no reciprocal changes. New diffuse ST elevation when compared to prior from 06/2016 ____________________________________________  RADIOLOGY  Interpreted by me: US venous: Concerning for acute extensive DVT on the right lower extremity   Interpretation by Radiologist:  US Venous Img Lower Unilateral Right  Result Date: 05/19/2017 CLINICAL DATA:  Pain  and swelling in RIGHT lower extremity, prior history of DVT and pulmonary embolism EXAM: RIGHT LOWER EXTREMITY VENOUS DOPPLER ULTRASOUND TECHNIQUE: Gray-scale sonography with graded compression, as well as color Doppler and duplex ultrasound were performed to evaluate the lower extremity deep venous systems from the level of the common femoral vein and including the common femoral, femoral, profunda femoral, popliteal and calf veins including the posterior tibial, peroneal and gastrocnemius veins when visible. The superficial great saphenous vein was also interrogated. Spectral Doppler was utilized to evaluate flow at rest and with distal augmentation maneuvers in the common femoral, femoral and popliteal veins. COMPARISON:  None. FINDINGS: Contralateral Common Femoral Vein: Respiratory phasicity is normal and symmetric with the symptomatic side. No evidence of thrombus. Normal compressibility. Common Femoral Vein: Occlusive hypoechoic thrombus, new.  Noncompressible. Saphenofemoral Junction: Thrombus extending into greater saphenous vein confluence Profunda Femoral Vein: Occlusive hypoechoic thrombus, new. Noncompressible. Femoral Vein: Occlusive hypoechoic thrombus, new.  Noncompressible. Popliteal Vein: Occlusive hypoechoic thrombus, new. Noncompressible. Calf Veins: Unable to adequately visualize due to body habitus and swelling Superficial Great Saphenous Vein: Thrombus identified within greater saphenous vein at the RIGHT thigh. Venous Reflux:  N/A Other Findings:  N/A IMPRESSION: Extensive acute appearing deep venous thrombosis in the RIGHT lower extremity as above, new since 06/30/2016. Electronically Signed   By: Lavonia Dana M.D.   On: 05/19/2017 20:32    ____________________________________________   PROCEDURES  Procedure(s) performed: None Procedures Critical Care performed: yes  CRITICAL CARE Performed by: Rudene Re  ?  Total critical care time: 35 min  Critical care time was exclusive of separately billable procedures and treating other patients.  Critical care was necessary to treat or prevent imminent or life-threatening deterioration.  Critical care was time spent personally by me on the following activities: development of treatment plan with patient and/or surrogate as well as nursing, discussions with consultants, evaluation of patient's response to treatment, examination of patient, obtaining history from patient or surrogate, ordering and performing treatments and interventions, ordering and review of laboratory studies, ordering and review of radiographic studies, pulse oximetry and re-evaluation of patient's condition.  ____________________________________________   INITIAL IMPRESSION / ASSESSMENT AND PLAN / ED COURSE  62 y.o. male with a history of CLL, DVT and no longer on Coumadin since October 2018, DM who presents for evaluation of right leg swelling and pain. 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 We'll start patient on heparin drip. The patient has no chest pain or shortness of breath, no tachycardia, no oxygen requirement or tachypnea. Labs and EKG are pending. We'll admit to the hospitalist service.    _________________________ 11:56 PM on 05/19/2017 -----------------------------------------  EKG with no acute evidence of ischemia or arrhythmia. Troponin is negative. Labs show a white count of 95K which is slightly elevated when compared to patient's baseline (76-83K). Patient on heparin gtt. Stable. Pain improved with morphine. Patient admitted to Hospitalist   As part of my medical decision making, I reviewed the following data within the [place] notes reviewed and incorporated, Labs reviewed , EKG interpreted , Old EKG reviewed, Old chart reviewed, Radiograph reviewed , Discussed with admitting physician , Notes from prior ED visits and [place] Controlled Substance Database    Pertinent labs & imaging results that were available during my care of the patient were reviewed by me and considered in my medical decision making (see chart for details).    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    Alfred Levins, [place], MD 05/19/17 [phone]

## 2017-05-19 NOTE — Progress Notes (Signed)
ANTICOAGULATION CONSULT NOTE - Initial Consult  Pharmacy Consult for Heparin  Indication: chest pain/ACS  No Known Allergies  Patient Measurements: Height: 6\' 3"  (190.5 cm) Weight: 256 lb (116.1 kg) IBW/kg (Calculated) : 84.5 Heparin Dosing Weight: 108.8 kg   Vital Signs: Temp: 98.8 F (37.1 C) (02/06 1931) Temp Source: Oral (02/06 1931) BP: 133/77 (02/06 1931) Pulse Rate: 76 (02/06 1931)  Labs: No results for input(s): HGB, HCT, PLT, APTT, LABPROT, INR, HEPARINUNFRC, HEPRLOWMOCWT, CREATININE, CKTOTAL, CKMB, TROPONINI in the last 72 hours.  CrCl cannot be calculated (Patient's most recent lab result is older than the maximum 21 days allowed.).   Medical History: Past Medical History:  Diagnosis Date  . Cancer ([place])    CLL monitored   . Clotting disorder ([place])   . Diabetes mellitus without complication (HCC)    Type II    Medications:   (Not in a hospital admission)  Assessment: Pharmacy consulted to dose heparin in this 62 year old male admitted with NSTEMI.  PTA med list says pt was on started on warfarin in 7/18.  Pt states he hasnt taken warfarin in 4 months.  CrCl = ?   Goal of Therapy:  Heparin level 0.3-0.7 units/ml Monitor platelets by anticoagulation protocol: Yes   Plan:  Give 4000 units bolus x 1 Start heparin infusion at 1400 units/hr Check anti-Xa level in 6 hours and daily while on heparin Continue to monitor H&H and platelets  Demaree Liberto D 05/19/2017,9:40 PM

## 2017-05-19 NOTE — ED Triage Notes (Signed)
FIRST NURSE NOTE-hx blood clot, feels same in right leg.

## 2017-05-20 ENCOUNTER — Other Ambulatory Visit: Payer: Self-pay

## 2017-05-20 ENCOUNTER — Encounter: Payer: Self-pay | Admitting: *Deleted

## 2017-05-20 LAB — CBC
HCT: 39.7 % — ABNORMAL LOW (ref 40.0–52.0)
HEMOGLOBIN: 12.5 g/dL — AB (ref 13.0–18.0)
MCH: 28.7 pg (ref 26.0–34.0)
MCHC: 31.6 g/dL — AB (ref 32.0–36.0)
MCV: 90.8 fL (ref 80.0–100.0)
Platelets: 112 10*3/uL — ABNORMAL LOW (ref 150–440)
RBC: 4.37 MIL/uL — AB (ref 4.40–5.90)
RDW: 15.4 % — ABNORMAL HIGH (ref 11.5–14.5)
WBC: 87.8 10*3/uL (ref 3.8–10.6)

## 2017-05-20 LAB — BASIC METABOLIC PANEL
ANION GAP: 9 (ref 5–15)
BUN: 19 mg/dL (ref 6–20)
CALCIUM: 8.6 mg/dL — AB (ref 8.9–10.3)
CO2: 28 mmol/L (ref 22–32)
Chloride: 102 mmol/L (ref 101–111)
Creatinine, Ser: 1.09 mg/dL (ref 0.61–1.24)
Glucose, Bld: 232 mg/dL — ABNORMAL HIGH (ref 65–99)
Potassium: 4.7 mmol/L (ref 3.5–5.1)
Sodium: 139 mmol/L (ref 135–145)

## 2017-05-20 LAB — GLUCOSE, CAPILLARY
GLUCOSE-CAPILLARY: 189 mg/dL — AB (ref 65–99)
GLUCOSE-CAPILLARY: 224 mg/dL — AB (ref 65–99)
Glucose-Capillary: 176 mg/dL — ABNORMAL HIGH (ref 65–99)
Glucose-Capillary: 134 mg/dL — ABNORMAL HIGH (ref 65–99)
Glucose-Capillary: 127 mg/dL — ABNORMAL HIGH (ref 65–99)

## 2017-05-20 LAB — HEPARIN LEVEL (UNFRACTIONATED)
HEPARIN UNFRACTIONATED: 0.62 [IU]/mL (ref 0.30–0.70)
Heparin Unfractionated: 0.14 IU/mL — ABNORMAL LOW (ref 0.30–0.70)
Heparin Unfractionated: 0.45 IU/mL (ref 0.30–0.70)

## 2017-05-20 MED ORDER — PNEUMOCOCCAL VAC POLYVALENT 25 MCG/0.5ML IJ INJ
0.5000 mL | INJECTION | INTRAMUSCULAR | Status: AC
  Administered 2017-05-21: 0.5 mL via INTRAMUSCULAR
  Filled 2017-05-20: qty 0.5

## 2017-05-20 MED ORDER — ACETAMINOPHEN 325 MG PO TABS
650.0000 mg | ORAL_TABLET | Freq: Four times a day (QID) | ORAL | Status: DC | PRN
  Administered 2017-05-20 – 2017-05-22 (×3): 650 mg via ORAL
  Filled 2017-05-20 (×3): qty 2

## 2017-05-20 MED ORDER — ONDANSETRON HCL 4 MG PO TABS: 4.0000 mg | ORAL_TABLET | Freq: Four times a day (QID) | ORAL | Status: DC | PRN

## 2017-05-20 MED ORDER — INSULIN ASPART 100 UNIT/ML ~~LOC~~ SOLN
0.0000 [IU] | Freq: Every day | SUBCUTANEOUS | Status: DC
  Administered 2017-05-20: 2 [IU] via SUBCUTANEOUS
  Administered 2017-05-22: 3 [IU] via SUBCUTANEOUS
  Filled 2017-05-20 (×2): qty 1

## 2017-05-20 MED ORDER — ONDANSETRON HCL 4 MG/2ML IJ SOLN: 4.0000 mg | Freq: Four times a day (QID) | INTRAMUSCULAR | Status: DC | PRN

## 2017-05-20 MED ORDER — HEPARIN BOLUS VIA INFUSION
3300.0000 [IU] | Freq: Once | INTRAVENOUS | Status: AC
  Administered 2017-05-20: 3300 [IU] via INTRAVENOUS
  Filled 2017-05-20: qty 3300

## 2017-05-20 MED ORDER — INFLUENZA VAC SPLIT QUAD 0.5 ML IM SUSY
0.5000 mL | PREFILLED_SYRINGE | INTRAMUSCULAR | Status: AC
  Administered 2017-05-21: 0.5 mL via INTRAMUSCULAR
  Filled 2017-05-20: qty 0.5

## 2017-05-20 MED ORDER — METFORMIN HCL ER 750 MG PO TB24
750.0000 mg | ORAL_TABLET | Freq: Every day | ORAL | Status: DC
  Administered 2017-05-20 – 2017-05-23 (×4): 750 mg via ORAL
  Filled 2017-05-20 (×5): qty 1

## 2017-05-20 MED ORDER — MORPHINE SULFATE (PF) 4 MG/ML IV SOLN
4.0000 mg | INTRAVENOUS | Status: DC | PRN
  Administered 2017-05-20 – 2017-05-24 (×4): 4 mg via INTRAVENOUS
  Filled 2017-05-20 (×5): qty 1

## 2017-05-20 MED ORDER — INSULIN ASPART 100 UNIT/ML ~~LOC~~ SOLN
0.0000 [IU] | Freq: Three times a day (TID) | SUBCUTANEOUS | Status: DC
  Administered 2017-05-20 (×2): 2 [IU] via SUBCUTANEOUS
  Administered 2017-05-20 – 2017-05-21 (×2): 1 [IU] via SUBCUTANEOUS
  Administered 2017-05-21: 2 [IU] via SUBCUTANEOUS
  Administered 2017-05-21: 1 [IU] via SUBCUTANEOUS
  Administered 2017-05-22: 2 [IU] via SUBCUTANEOUS
  Administered 2017-05-22 – 2017-05-23 (×2): 3 [IU] via SUBCUTANEOUS
  Administered 2017-05-23 – 2017-05-24 (×3): 1 [IU] via SUBCUTANEOUS
  Administered 2017-05-25 (×2): 2 [IU] via SUBCUTANEOUS
  Filled 2017-05-20 (×14): qty 1

## 2017-05-20 MED ORDER — OXYCODONE HCL 5 MG PO TABS
5.0000 mg | ORAL_TABLET | ORAL | Status: DC | PRN
  Administered 2017-05-20 – 2017-05-24 (×4): 5 mg via ORAL
  Filled 2017-05-20 (×4): qty 1

## 2017-05-20 MED ORDER — ACETAMINOPHEN 650 MG RE SUPP: 650.0000 mg | Freq: Four times a day (QID) | RECTAL | Status: DC | PRN

## 2017-05-20 NOTE — Progress Notes (Signed)
ANTICOAGULATION CONSULT NOTE - Initial Consult  Pharmacy Consult for Heparin  Indication: chest pain/ACS  No Known Allergies  Patient Measurements: Height: 6\' 3"  (190.5 cm) Weight: 256 lb (116.1 kg) IBW/kg (Calculated) : 84.5 Heparin Dosing Weight: 108.8 kg   Vital Signs: Temp: 98.3 F (36.8 C) (02/07 1220) BP: 114/67 (02/07 1220) Pulse Rate: 62 (02/07 1220)  Labs: Recent Labs    05/19/17 2154 05/20/17 0449 05/20/17 1311 05/20/17 1910  HGB 13.5 12.5*  --   --   HCT 42.6 39.7*  --   --   PLT 112* 112*  --   --   LABPROT 14.4  --   --   --   INR 1.13  --   --   --   HEPARINUNFRC  --  0.14* 0.62 0.45  CREATININE 0.98 1.09  --   --   TROPONINI <0.03  --   --   --     Estimated Creatinine Clearance: 100.2 mL/min (by C-G formula based on SCr of 1.09 mg/dL).   Medical History: Past Medical History:  Diagnosis Date  . Cancer ([place])    CLL monitored   . Clotting disorder ([place])   . Diabetes mellitus without complication ([place])    Type II    Medications:  Medications Prior to Admission  Medication Sig Dispense Refill Last Dose  . metFORMIN (GLUCOPHAGE-XR) 750 MG 24 hr tablet Take 750 mg by mouth daily.   05/18/2017 at Unknown time  . methocarbamol (ROBAXIN) 500 MG tablet Take 1 tablet (500 mg total) by mouth every 6 (six) hours as needed for muscle spasms. (Patient not taking: Reported on 05/19/2017) 12 tablet 0 Not Taking at Unknown time  . predniSONE (DELTASONE) 10 MG tablet Take 6 tablets  today, on day 2 take 5 tablets, day 3 take 4 tablets, day 4 take 3 tablets, day 5 take  2 tablets and 1 tablet the last day (Patient not taking: Reported on 05/19/2017) 21 tablet 0 Not Taking at Unknown time  . warfarin (COUMADIN) 7.5 MG tablet Take 1 tablet (7.5 mg total) by mouth daily at 6 PM. (Patient not taking: Reported on 05/19/2017) 20 tablet 0 Not Taking at Unknown time    Assessment: Pharmacy consulted to dose heparin in this 62 year old male admitted with NSTEMI.  PTA med list  says pt was on started on warfarin in 7/18.  Pt states he hasnt taken warfarin in 4 months.  CrCl = ?   Goal of Therapy:  Heparin level 0.3-0.7 units/ml Monitor platelets by anticoagulation protocol: Yes   Plan:  Give 4000 units bolus x 1 Start heparin infusion at 1400 units/hr Check anti-Xa level in 6 hours and daily while on heparin Continue to monitor H&H and platelets   2/7 AM heparin level 0.14. 3300 unit bolus and increase rate to 1800 units/hr. Recheck in 6 hours.  2/7: Heparin level resulted @ 0.62. Will continue current rate. Will recheck Heparin level @ 1930.   2/7@2000  HL 0.45, therapeutic, continue heparin iv 1800units/hr and recheck with am labs per protocol.   Donna Christen Norena Bratton 05/20/2017,7:52 PM

## 2017-05-20 NOTE — Progress Notes (Signed)
Inpatient Diabetes Program Recommendations  AACE/ADA: New Consensus Statement on Inpatient Glycemic Control (2015)  Target Ranges:  Prepandial:   less than 140 mg/dL      Peak postprandial:   less than 180 mg/dL (1-2 hours)      Critically ill patients:  140 - 180 mg/dL   Lab Results  Component Value Date   GLUCAP 176 (H) 05/20/2017   HGBA1C 11.8 (H) 10/29/2016    Review of Glycemic Control  Results for ABDULAZIZ, TOMAN (MRN [id number]) as of 05/20/2017 13:11  Ref. Range 05/20/2017 00:53 05/20/2017 07:30 05/20/2017 11:35  Glucose-Capillary Latest Ref Range: 65 - 99 mg/dL 224 (H) 189 (H) 176 (H)    Diabetes history: Type 2 Outpatient Diabetes medications: Metformin 750mg  qday  Current orders for Inpatient glycemic control: Metformin 750mg  qday, Novolog 0-9 units tid, Novolog 0-5 units   Inpatient Diabetes Program Recommendations:  Consider adding Lantus 12 units qhs (0.1unit/kg)   Per ADA recommendations "consider performing an A1C on all patients with diabetes or hyperglycemia admitted to the hospital if not performed in the prior 3 months".  Gentry Fitz, RN, BA, MHA, CDE Diabetes Coordinator Inpatient Diabetes Program  [phone] (Team Pager) [phone] ([place]) 05/20/2017 1:16 PM

## 2017-05-20 NOTE — Progress Notes (Signed)
[facility] [place] NAME: Horice Carrero    MR#:  [id number]  DATE OF BIRTH:  02-Sep-1955  SUBJECTIVE: Patient admitted for right leg DVT.  Patient had a right leg DVT before and received morphine for 6 months but now has swelling, redness for 2- 3 days.  Now on heparin drip.  Patient requesting that she gets Eliquis this time and likely discharge home tomorrow.  He  states swelling, pain is improved from yesterday.  CHIEF COMPLAINT:   Chief Complaint  Patient presents with  . Leg Pain    REVIEW OF SYSTEMS:   ROS CONSTITUTIONAL: No fever, fatigue or weakness.  EYES: No blurred or double vision.  EARS, NOSE, AND THROAT: No tinnitus or ear pain.  RESPIRATORY: No cough, shortness of breath, wheezing or hemoptysis.  CARDIOVASCULAR: No chest pain, orthopnea, edema.  GASTROINTESTINAL: No nausea, vomiting, diarrhea or abdominal pain.  GENITOURINARY: No dysuria, hematuria.  ENDOCRINE: No polyuria, nocturia,  HEMATOLOGY: No anemia, easy bruising or bleeding SKIN: No rash or lesion.  MUSCULOSKELETAL swelling, erythema of the right thigh no tenderness to palpation.   NEUROLOGIC: No tingling, numbness, weakness.  PSYCHIATRY: No anxiety or depression.   DRUG ALLERGIES:  No Known Allergies  VITALS:  Blood pressure 114/67, pulse 62, temperature 98.3 F (36.8 C), resp. rate 18, height 6\' 3"  (1.905 m), weight 116.1 kg (256 lb), SpO2 100 %.  PHYSICAL EXAMINATION:  GENERAL:  62 y.o.-year-old patient lying in the bed with no acute distress.  EYES: Pupils equal, round, reactive to light and accommodation. No scleral icterus. Extraocular muscles intact.  HEENT: Head atraumatic, normocephalic. Oropharynx and nasopharynx clear.  NECK:  Supple, no jugular venous distention. No thyroid enlargement, no tenderness.  LUNGS: Normal breath sounds bilaterally, no wheezing, rales,rhonchi or crepitation. No use of accessory muscles of respiration.   CARDIOVASCULAR: S1, S2 normal. No murmurs, rubs, or gallops.  ABDOMEN: Soft, nontender, nondistended. Bowel sounds present. No organomegaly or mass.  EXTREMITIES:   NEUROLOGIC: Cranial nerves II through XII are intact. Muscle strength 5/5 in all extremities. Sensation intact. Gait not checked.  PSYCHIATRIC: The patient is alert and oriented x 3.  SKIN: No obvious rash, lesion, or ulcer.    LABORATORY PANEL:   CBC Recent Labs  Lab 05/20/17 0449  WBC 87.8*  HGB 12.5*  HCT 39.7*  PLT 112*   ------------------------------------------------------------------------------------------------------------------  Chemistries  Recent Labs  Lab 05/19/17 2154 05/20/17 0449  NA 139 139  K 5.0 4.7  CL 103 102  CO2 26 28  GLUCOSE 135* 232*  BUN 18 19  CREATININE 0.98 1.09  CALCIUM 8.7* 8.6*  AST 19  --   ALT 16*  --   ALKPHOS 53  --   BILITOT 1.0  --    ------------------------------------------------------------------------------------------------------------------  Cardiac Enzymes Recent Labs  Lab 05/19/17 2154  TROPONINI <0.03   ------------------------------------------------------------------------------------------------------------------  RADIOLOGY:  US Venous Img Lower Unilateral Right  Result Date: 05/19/2017 CLINICAL DATA:  Pain and swelling in RIGHT lower extremity, prior history of DVT and pulmonary embolism EXAM: RIGHT LOWER EXTREMITY VENOUS DOPPLER ULTRASOUND TECHNIQUE: Gray-scale sonography with graded compression, as well as color Doppler and duplex ultrasound were performed to evaluate the lower extremity deep venous systems from the level of the common femoral vein and including the common femoral, femoral, profunda femoral, popliteal and calf veins including the posterior tibial, peroneal and gastrocnemius veins when visible. The superficial great saphenous vein was also interrogated. Spectral Doppler was utilized to  evaluate flow at rest and with distal  augmentation maneuvers in the common femoral, femoral and popliteal veins. COMPARISON:  None. FINDINGS: Contralateral Common Femoral Vein: Respiratory phasicity is normal and symmetric with the symptomatic side. No evidence of thrombus. Normal compressibility. Common Femoral Vein: Occlusive hypoechoic thrombus, new. Noncompressible. Saphenofemoral Junction: Thrombus extending into greater saphenous vein confluence Profunda Femoral Vein: Occlusive hypoechoic thrombus, new. Noncompressible. Femoral Vein: Occlusive hypoechoic thrombus, new.  Noncompressible. Popliteal Vein: Occlusive hypoechoic thrombus, new. Noncompressible. Calf Veins: Unable to adequately visualize due to body habitus and swelling Superficial Great Saphenous Vein: Thrombus identified within greater saphenous vein at the RIGHT thigh. Venous Reflux:  N/A Other Findings:  N/A IMPRESSION: Extensive acute appearing deep venous thrombosis in the RIGHT lower extremity as above, new since 06/30/2016. Electronically Signed   By: Lavonia Dana M.D.   On: 05/19/2017 20:32    EKG:   Orders placed or performed during the hospital encounter of 05/19/17  . ED EKG  . 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  Eliquis.   lifelong anticoagulation this time.  We will discharge home tomorrow patient can follow-up with Dr. [name]. 2.  CLL: Monitor closely [place].  #3 diabetes mellitus type 2: Continue sliding scale insulin with coverage, restart metformin.   All the records are reviewed and case discussed with Care Management/Social Workerr. Management plans discussed with the patient, family and they are in agreement.  CODE STATUS: full  TOTAL TIME TAKING CARE OF THIS PATIENT: 74minutes.   POSSIBLE D/C IN 1-2 DAYS, DEPENDING ON CLINICAL CONDITION.   Epifanio Lesches M.D on 05/20/2017 at 12:53 PM  Between 7am to 6pm - Pager - [phone]  After 6pm go  to [email or web address] - password [email or web address] [facility]  Office  [phone]  CC: Primary care physician; [name], MD   Note: This dictation was prepared with Dragon dictation along with smaller phrase technology. Any transcriptional errors that result from this process are unintentional.

## 2017-05-20 NOTE — Progress Notes (Signed)
ANTICOAGULATION CONSULT NOTE - Initial Consult  Pharmacy Consult for Heparin  Indication: chest pain/ACS  No Known Allergies  Patient Measurements: Height: 6\' 3"  (190.5 cm) Weight: 256 lb (116.1 kg) IBW/kg (Calculated) : 84.5 Heparin Dosing Weight: 108.8 kg   Vital Signs: Temp: 97.5 F (36.4 C) (02/07 0443) Temp Source: Oral (02/07 0443) BP: 118/70 (02/07 0443) Pulse Rate: 57 (02/07 0443)  Labs: Recent Labs    05/19/17 2154 05/20/17 0449  HGB 13.5 12.5*  HCT 42.6 39.7*  PLT 112* 112*  LABPROT 14.4  --   INR 1.13  --   HEPARINUNFRC  --  0.14*  CREATININE 0.98 1.09  TROPONINI <0.03  --     Estimated Creatinine Clearance: 100.2 mL/min (by C-G formula based on SCr of 1.09 mg/dL).   Medical History: Past Medical History:  Diagnosis Date  . Cancer ([place])    CLL monitored   . Clotting disorder ([place])   . Diabetes mellitus without complication ([place])    Type II    Medications:  Medications Prior to Admission  Medication Sig Dispense Refill Last Dose  . metFORMIN (GLUCOPHAGE-XR) 750 MG 24 hr tablet Take 750 mg by mouth daily.   05/18/2017 at Unknown time  . methocarbamol (ROBAXIN) 500 MG tablet Take 1 tablet (500 mg total) by mouth every 6 (six) hours as needed for muscle spasms. (Patient not taking: Reported on 05/19/2017) 12 tablet 0 Not Taking at Unknown time  . predniSONE (DELTASONE) 10 MG tablet Take 6 tablets  today, on day 2 take 5 tablets, day 3 take 4 tablets, day 4 take 3 tablets, day 5 take  2 tablets and 1 tablet the last day (Patient not taking: Reported on 05/19/2017) 21 tablet 0 Not Taking at Unknown time  . warfarin (COUMADIN) 7.5 MG tablet Take 1 tablet (7.5 mg total) by mouth daily at 6 PM. (Patient not taking: Reported on 05/19/2017) 20 tablet 0 Not Taking at Unknown time    Assessment: Pharmacy consulted to dose heparin in this 62 year old male admitted with NSTEMI.  PTA med list says pt was on started on warfarin in 7/18.  Pt states he hasnt taken warfarin  in 4 months.  CrCl = ?   Goal of Therapy:  Heparin level 0.3-0.7 units/ml Monitor platelets by anticoagulation protocol: Yes   Plan:  Give 4000 units bolus x 1 Start heparin infusion at 1400 units/hr Check anti-Xa level in 6 hours and daily while on heparin Continue to monitor H&H and platelets   2/7 AM heparin level 0.14. 3300 unit bolus and increase rate to 1800 units/hr. Recheck in 6 hours.  Rigdon Macomber S 05/20/2017,6:36 AM

## 2017-05-20 NOTE — Progress Notes (Signed)
ANTICOAGULATION CONSULT NOTE - Initial Consult  Pharmacy Consult for Heparin  Indication: chest pain/ACS  No Known Allergies  Patient Measurements: Height: 6\' 3"  (190.5 cm) Weight: 256 lb (116.1 kg) IBW/kg (Calculated) : 84.5 Heparin Dosing Weight: 108.8 kg   Vital Signs: Temp: 98.3 F (36.8 C) (02/07 1220) Temp Source: Oral (02/07 0443) BP: 114/67 (02/07 1220) Pulse Rate: 62 (02/07 1220)  Labs: Recent Labs    05/19/17 2154 05/20/17 0449 05/20/17 1311  HGB 13.5 12.5*  --   HCT 42.6 39.7*  --   PLT 112* 112*  --   LABPROT 14.4  --   --   INR 1.13  --   --   HEPARINUNFRC  --  0.14* 0.62  CREATININE 0.98 1.09  --   TROPONINI <0.03  --   --     Estimated Creatinine Clearance: 100.2 mL/min (by C-G formula based on SCr of 1.09 mg/dL).   Medical History: Past Medical History:  Diagnosis Date  . Cancer ([place])    CLL monitored   . Clotting disorder ([place])   . Diabetes mellitus without complication ([place])    Type II    Medications:  Medications Prior to Admission  Medication Sig Dispense Refill Last Dose  . metFORMIN (GLUCOPHAGE-XR) 750 MG 24 hr tablet Take 750 mg by mouth daily.   05/18/2017 at Unknown time  . methocarbamol (ROBAXIN) 500 MG tablet Take 1 tablet (500 mg total) by mouth every 6 (six) hours as needed for muscle spasms. (Patient not taking: Reported on 05/19/2017) 12 tablet 0 Not Taking at Unknown time  . predniSONE (DELTASONE) 10 MG tablet Take 6 tablets  today, on day 2 take 5 tablets, day 3 take 4 tablets, day 4 take 3 tablets, day 5 take  2 tablets and 1 tablet the last day (Patient not taking: Reported on 05/19/2017) 21 tablet 0 Not Taking at Unknown time  . warfarin (COUMADIN) 7.5 MG tablet Take 1 tablet (7.5 mg total) by mouth daily at 6 PM. (Patient not taking: Reported on 05/19/2017) 20 tablet 0 Not Taking at Unknown time    Assessment: Pharmacy consulted to dose heparin in this 62 year old male admitted with NSTEMI.  PTA med list says pt was on started  on warfarin in 7/18.  Pt states he hasnt taken warfarin in 4 months.  CrCl = ?   Goal of Therapy:  Heparin level 0.3-0.7 units/ml Monitor platelets by anticoagulation protocol: Yes   Plan:  Give 4000 units bolus x 1 Start heparin infusion at 1400 units/hr Check anti-Xa level in 6 hours and daily while on heparin Continue to monitor H&H and platelets   2/7 AM heparin level 0.14. 3300 unit bolus and increase rate to 1800 units/hr. Recheck in 6 hours.  2/7: Heparin level resulted @ 0.62. Will continue current rate. Will recheck Heparin level @ 1930.   Anusha Claus D 05/20/2017,2:22 PM

## 2017-05-21 DIAGNOSIS — E119 Type 2 diabetes mellitus without complications: Secondary | ICD-10-CM

## 2017-05-21 DIAGNOSIS — R600 Localized edema: Secondary | ICD-10-CM

## 2017-05-21 DIAGNOSIS — M79604 Pain in right leg: Secondary | ICD-10-CM

## 2017-05-21 DIAGNOSIS — I82401 Acute embolism and thrombosis of unspecified deep veins of right lower extremity: Secondary | ICD-10-CM

## 2017-05-21 LAB — CBC
HEMATOCRIT: 41.7 % (ref 40.0–52.0)
HEMOGLOBIN: 13.4 g/dL (ref 13.0–18.0)
MCH: 28.9 pg (ref 26.0–34.0)
MCHC: 32.3 g/dL (ref 32.0–36.0)
MCV: 89.5 fL (ref 80.0–100.0)
Platelets: 111 10*3/uL — ABNORMAL LOW (ref 150–440)
RBC: 4.66 MIL/uL (ref 4.40–5.90)
RDW: 15.2 % — ABNORMAL HIGH (ref 11.5–14.5)
WBC: 84.7 10*3/uL (ref 3.8–10.6)

## 2017-05-21 LAB — GLUCOSE, CAPILLARY
GLUCOSE-CAPILLARY: 144 mg/dL — AB (ref 65–99)
GLUCOSE-CAPILLARY: 190 mg/dL — AB (ref 65–99)
GLUCOSE-CAPILLARY: 146 mg/dL — AB (ref 65–99)
Glucose-Capillary: 143 mg/dL — ABNORMAL HIGH (ref 65–99)
Glucose-Capillary: 188 mg/dL — ABNORMAL HIGH (ref 65–99)

## 2017-05-21 LAB — HEPARIN LEVEL (UNFRACTIONATED): HEPARIN UNFRACTIONATED: 0.37 [IU]/mL (ref 0.30–0.70)

## 2017-05-21 LAB — HEMOGLOBIN A1C
Hgb A1c MFr Bld: 6.6 % — ABNORMAL HIGH (ref 4.8–5.6)
MEAN PLASMA GLUCOSE: 142.72 mg/dL

## 2017-05-21 MED ORDER — INSULIN GLARGINE 100 UNIT/ML ~~LOC~~ SOLN
12.0000 [IU] | Freq: Every day | SUBCUTANEOUS | Status: DC
  Administered 2017-05-21 – 2017-05-24 (×4): 12 [IU] via SUBCUTANEOUS
  Filled 2017-05-21 (×5): qty 0.12

## 2017-05-21 NOTE — Consult Note (Addendum)
[facility] Vascular Consult Note  MRN : [id number]  Anthony Hart. is a 62 y.o. (1955/07/27) male who presents with chief complaint of  Chief Complaint  Patient presents with  . Leg Pain   History of Present Illness:  The patient is a 62 year old male with a past medical history of recurrent DVT, CLL, diabetes who presented with progressively worsening pain and swelling to the right lower extremity on May 19, 2017.  The patient was found to have an extensive acute occlusive thrombus located to the right common femoral vein, saphenofemoral junction, profunda femoral vein, femoral vein, popliteal vein, the calf veins were unable to be adequately visualized due to body habitus and swelling.  The patient notes continued pain with ambulation requiring the use of morphine to control his symptoms.  The patient continues to experience difficulty with ambulation even though heparin drip has been started.  The patient denies any shortness of breath or chest pain.  The patient denies any fever, nausea vomiting.  The patient notes his anticoagulation was stopped in October 2018 by his oncologist.  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  The patient does not engage in conservative therapy including wearing medical grade 1 compression stockings and elevating his legs on a daily basis.  The vascular surgery service was consulted by Dr. Vianne Bulls for evaluation of possible venous lysis.  The patient is undergone this procedure twice before in the past.  The patient states that he still has a filter in from a previous venous lysis.  Current Facility-Administered Medications  Medication Dose Route Frequency Provider Last Rate Last Dose  . acetaminophen (TYLENOL) tablet 650 mg  650 mg Oral Q6H PRN Lance Coon, MD   650 mg at 05/20/17 2202   Or  . acetaminophen (TYLENOL) suppository 650 mg  650 mg Rectal Q6H PRN  Lance Coon, MD      . heparin ADULT infusion 100 units/mL (25000 units/255mL sodium chloride 0.45%)  1,800 Units/hr Intravenous Continuous Alfred Levins, [place], MD 18 mL/hr at 05/21/17 0005 1,800 Units/hr at 05/21/17 0005  . insulin aspart (novoLOG) injection 0-5 Units  0-5 Units Subcutaneous QHS Lance Coon, MD   2 Units at 05/20/17 0117  . insulin aspart (novoLOG) injection 0-9 Units  0-9 Units Subcutaneous TID WC Lance Coon, MD   1 Units at 05/21/17 1139  . metFORMIN (GLUCOPHAGE-XR) 24 hr tablet 750 mg  750 mg Oral Daily Epifanio Lesches, MD   750 mg at 05/21/17 0941  . morphine 4 MG/ML injection 4 mg  4 mg Intravenous Q4H PRN Lance Coon, MD   4 mg at 05/21/17 0553  . ondansetron (ZOFRAN) tablet 4 mg  4 mg Oral Q6H PRN Lance Coon, MD       Or  . ondansetron [facility]) injection 4 mg  4 mg Intravenous Q6H PRN Lance Coon, MD      . oxyCODONE (Oxy IR/ROXICODONE) immediate release tablet 5 mg  5 mg Oral Q4H PRN Lance Coon, MD   5 mg at 05/20/17 2049   Past Medical History:  Diagnosis Date  . Cancer ([place])    CLL monitored   . Clotting disorder ([place])   . Diabetes mellitus without complication ([place])    Type II   Past Surgical History:  Procedure Laterality Date  . APPENDECTOMY    . COLONOSCOPY WITH PROPOFOL  02/04/2015   Procedure: COLONOSCOPY WITH PROPOFOL;  Surgeon: Eddie Dibbles  Johnell Comings, MD;  Location: [facility] ENDOSCOPY;  Service: Gastroenterology;;  . ESOPHAGOGASTRODUODENOSCOPY (EGD) WITH PROPOFOL N/A 02/04/2015   Procedure: ESOPHAGOGASTRODUODENOSCOPY (EGD) WITH PROPOFOL;  Surgeon: [name], MD;  Location: [facility] ENDOSCOPY;  Service: Gastroenterology;  Laterality: N/A;  . PERIPHERAL VASCULAR THROMBECTOMY Right 07/02/2016   Procedure: Peripheral Vascular Thrombectomy and IVC filter;  Surgeon: [name], MD;  Location: [place] CV LAB;  Service: Cardiovascular;  Laterality: Right;  . PERIPHERAL VASCULAR THROMBECTOMY Bilateral 10/30/2016   Procedure: Peripheral Vascular  Thrombectomy;  Surgeon: [name], MD;  Location: [place] CV LAB;  Service: Cardiovascular;  Laterality: Bilateral;   Social History Social History   Tobacco Use  . Smoking status: Never Smoker  . Smokeless tobacco: Never Used  Substance Use Topics  . Alcohol use: No  . Drug use: No   Family History Family History  Problem Relation Age of Onset  . Diabetes Mother   . Cancer Brother   . Cancer Maternal Aunt   . Stroke Maternal Uncle   The patient denies any family history of peripheral artery disease venous disease or renal disease.  No Known Allergies   REVIEW OF SYSTEMS (Negative unless checked)  Constitutional: [] Weight loss  [] Fever  [] Chills Cardiac: [] Chest pain   [] Chest pressure   [] Palpitations   [] Shortness of breath when laying flat   [] Shortness of breath at rest   [] Shortness of breath with exertion. Vascular:  [x] Pain in legs with walking   [x] Pain in legs at rest   [x] Pain in legs when laying flat   [] Claudication   [x] Pain in feet when walking  [] Pain in feet at rest  [x] Pain in feet when laying flat   [x] History of DVT   [] Phlebitis   [x] Swelling in legs   [] Varicose veins   [] Non-healing ulcers Pulmonary:   [] Uses home oxygen   [] Productive cough   [] Hemoptysis   [] Wheeze  [] COPD   [] Asthma Neurologic:  [] Dizziness  [] Blackouts   [] Seizures   [] History of stroke   [] History of TIA  [] Aphasia   [] Temporary blindness   [] Dysphagia   [] Weakness or numbness in arms   [] Weakness or numbness in legs Musculoskeletal:  [] Arthritis   [] Joint swelling   [] Joint pain   [] Low back pain Hematologic:  [] Easy bruising  [] Easy bleeding   [] Hypercoagulable state   [] Anemic  [] Hepatitis Gastrointestinal:  [] Blood in stool   [] Vomiting blood  [] Gastroesophageal reflux/heartburn   [] Difficulty swallowing. Genitourinary:  [] Chronic kidney disease   [] Difficult urination  [] Frequent urination  [] Burning with urination   [] Blood in urine Skin:  [] Rashes   [] Ulcers    [] Wounds Psychological:  [] History of anxiety   []  History of major depression.  Physical Examination  Vitals:   05/20/17 2130 05/21/17 0000 05/21/17 0550 05/21/17 1235  BP: 125/71  124/70 113/67  Pulse: 79  81 82  Resp: 17  18 16   Temp: (!) 101.3 F (38.5 C) 98.9 F (37.2 C) 98.3 F (36.8 C) 99.8 F (37.7 C)  TempSrc: Oral Oral Oral Oral  SpO2: 96%  96% 96%  Weight:      Height:       Body mass index is 32 kg/m. Gen:  WD/WN, NAD Head: [place]/AT, No temporalis wasting. Prominent temp pulse not noted. Ear/Nose/Throat: Hearing grossly intact, nares w/o erythema or drainage, oropharynx w/o Erythema/Exudate Eyes: Sclera non-icteric, conjunctiva clear Neck: Trachea midline.  No JVD.  Pulmonary:  Good air movement, respirations not labored, equal bilaterally.  Cardiac: RRR, normal  S1, S2. Vascular:  Vessel Right Left  Radial Palpable Palpable  Ulnar Palpable Palpable  Brachial Palpable Palpable  Carotid Palpable, without bruit Palpable, without bruit  Aorta Not palpable N/A  Femoral Palpable Palpable  Popliteal Palpable Palpable  PT Palpable Palpable  DP Palpable Palpable   Right lower extremity: Hard to palpate right pedal pulses however the foot is warm.  Calf is soft.  Thigh is soft.  Patient with moderate nonpitting edema noted to the right lower extremity.  There is no acute vascular compromise to the right lower extremity.  There is no cellulitis.  There is no ulceration noted to the right lower extremity.  Gastrointestinal: soft, non-tender/non-distended. No guarding/reflex.  Musculoskeletal: M/S 5/5 throughout.  Extremities without ischemic changes.  No deformity or atrophy.  Neurologic: Sensation grossly intact in extremities.  Symmetrical.  Speech is fluent. Motor exam as listed above. Psychiatric: Judgment intact, Mood & affect appropriate for pt's clinical situation. Dermatologic: No rashes or ulcers noted.  No cellulitis or open wounds. Lymph : No Cervical,  Axillary, or Inguinal lymphadenopathy.          CBC Lab Results  Component Value Date   WBC 84.7 (HH) 05/21/2017   HGB 13.4 05/21/2017   HCT 41.7 05/21/2017   MCV 89.5 05/21/2017   PLT 111 (L) 05/21/2017   BMET       Component Value Date/Time   NA 139 05/20/2017 0449   NA 141 12/23/2013 1350   K 4.7 05/20/2017 0449   K 4.9 12/23/2013 1350   CL 102 05/20/2017 0449   CL 108 (H) 12/23/2013 1350   CO2 28 05/20/2017 0449   CO2 28 12/23/2013 1350   GLUCOSE 232 (H) 05/20/2017 0449   GLUCOSE 90 12/23/2013 1350   BUN 19 05/20/2017 0449   BUN 9 12/23/2013 1350   CREATININE 1.09 05/20/2017 0449   CREATININE 1.17 12/23/2013 1350   CALCIUM 8.6 (L) 05/20/2017 0449   CALCIUM 9.1 12/23/2013 1350   GFRNONAA >60 05/20/2017 0449   GFRNONAA >60 12/23/2013 1350   GFRAA >60 05/20/2017 0449   GFRAA >60 12/23/2013 1350   Estimated Creatinine Clearance: 100.2 mL/min (by C-G formula based on SCr of 1.09 mg/dL).  COAG Lab Results  Component Value Date   INR 1.13 05/19/2017   INR 2.08 11/04/2016   INR 1.93 11/04/2016   Radiology US Venous Img Lower Unilateral Right  Result Date: 05/19/2017 CLINICAL DATA:  Pain and swelling in RIGHT lower extremity, prior history of DVT and pulmonary embolism EXAM: RIGHT LOWER EXTREMITY VENOUS DOPPLER ULTRASOUND TECHNIQUE: Gray-scale sonography with graded compression, as well as color Doppler and duplex ultrasound were performed to evaluate the lower extremity deep venous systems from the level of the common femoral vein and including the common femoral, femoral, profunda femoral, popliteal and calf veins including the posterior tibial, peroneal and gastrocnemius veins when visible. The superficial great saphenous vein was also interrogated. Spectral Doppler was utilized to evaluate flow at rest and with distal augmentation maneuvers in the common femoral, femoral and popliteal veins. COMPARISON:  None. FINDINGS: Contralateral Common Femoral Vein: Respiratory  phasicity is normal and symmetric with the symptomatic side. No evidence of thrombus. Normal compressibility. Common Femoral Vein: Occlusive hypoechoic thrombus, new. Noncompressible. Saphenofemoral Junction: Thrombus extending into greater saphenous vein confluence Profunda Femoral Vein: Occlusive hypoechoic thrombus, new. Noncompressible. Femoral Vein: Occlusive hypoechoic thrombus, new.  Noncompressible. Popliteal Vein: Occlusive hypoechoic thrombus, new. Noncompressible. Calf Veins: Unable to adequately visualize due to body habitus and swelling Superficial Great  Saphenous Vein: Thrombus identified within greater saphenous vein at the RIGHT thigh. Venous Reflux:  N/A Other Findings:  N/A IMPRESSION: Extensive acute appearing deep venous thrombosis in the RIGHT lower extremity as above, new since 06/30/2016. Electronically Signed   By: Lavonia Dana M.D.   On: 05/19/2017 20:32   Assessment/Plan The patient is a 61 year old male with a past medical history of recurrent DVT, CLL, diabetes who presented with progressively worsening pain and swelling to the right lower extremity on May 19, 2017 -patient has been treated with heparin drip and IV narcotic pain medicine with minimal improvement in his symptoms 1.  Right lower extremity DVT: The patient has a past medical history of recurrent DVT to the lower extremity.  The patient was taken off of oral anticoagulation by his oncologist October 2018.  The patient has been treated with heparin and IV pain medication with minimal improvement to his symptoms.  The patient states he continues to experience pain with ambulation.  His discomfort is making ambulation very difficult.  Due to the patient's extensive DVT located a right lower extremity, symptoms and difficulty with ambulation he would be a candidate for venous lysis.  We can plan for this on Wed 05/26/17.  Procedure, risks and benefits were explained to the patient.  All questions were answered.  The patient  wishes to proceed.  Agree with transition the patient to Eliquis upon discharge.  2.  Diabetes: Encouraged good control as its slows the progression of atherosclerotic disease 3.  Lower extremity edema: The patient was encouraged to engage in conservative therapy on a daily basis.  This includes wearing medical grade 1 compression stockings and elevating his legs on a daily basis.  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  A more formal venous workup can be completed as an outpatient at our office.  Discussed with Dr. Mayme Genta, PA-C  05/21/2017 1:43 PM  This note was created with Dragon medical transcription system.  Any error is purely unintentional.

## 2017-05-21 NOTE — Progress Notes (Signed)
ANTICOAGULATION CONSULT NOTE - Initial Consult  Pharmacy Consult for Heparin  Indication: chest pain/ACS  No Known Allergies  Patient Measurements: Height: 6\' 3"  (190.5 cm) Weight: 256 lb (116.1 kg) IBW/kg (Calculated) : 84.5 Heparin Dosing Weight: 108.8 kg   Vital Signs: Temp: 98.3 F (36.8 C) (02/08 0550) Temp Source: Oral (02/08 0550) BP: 124/70 (02/08 0550) Pulse Rate: 81 (02/08 0550)  Labs: Recent Labs    05/19/17 2154  05/20/17 0449 05/20/17 1311 05/20/17 1910 05/21/17 0700  HGB 13.5  --  12.5*  --   --  13.4  HCT 42.6  --  39.7*  --   --  41.7  PLT 112*  --  112*  --   --  111*  LABPROT 14.4  --   --   --   --   --   INR 1.13  --   --   --   --   --   HEPARINUNFRC  --    < > 0.14* 0.62 0.45 0.37  CREATININE 0.98  --  1.09  --   --   --   TROPONINI <0.03  --   --   --   --   --    < > = values in this interval not displayed.    Estimated Creatinine Clearance: 100.2 mL/min (by C-G formula based on SCr of 1.09 mg/dL).   Medical History: Past Medical History:  Diagnosis Date  . Cancer ([place])    CLL monitored   . Clotting disorder ([place])   . Diabetes mellitus without complication ([place])    Type II    Medications:  Medications Prior to Admission  Medication Sig Dispense Refill Last Dose  . metFORMIN (GLUCOPHAGE-XR) 750 MG 24 hr tablet Take 750 mg by mouth daily.   05/18/2017 at Unknown time  . methocarbamol (ROBAXIN) 500 MG tablet Take 1 tablet (500 mg total) by mouth every 6 (six) hours as needed for muscle spasms. (Patient not taking: Reported on 05/19/2017) 12 tablet 0 Not Taking at Unknown time  . predniSONE (DELTASONE) 10 MG tablet Take 6 tablets  today, on day 2 take 5 tablets, day 3 take 4 tablets, day 4 take 3 tablets, day 5 take  2 tablets and 1 tablet the last day (Patient not taking: Reported on 05/19/2017) 21 tablet 0 Not Taking at Unknown time  . warfarin (COUMADIN) 7.5 MG tablet Take 1 tablet (7.5 mg total) by mouth daily at 6 PM. (Patient not taking:  Reported on 05/19/2017) 20 tablet 0 Not Taking at Unknown time    Assessment: Pharmacy consulted to dose heparin in this 62 year old male admitted with NSTEMI.  PTA med list says pt was on started on warfarin in 7/18.  Pt states he hasnt taken warfarin in 4 months.  CrCl = ?   Goal of Therapy:  Heparin level 0.3-0.7 units/ml Monitor platelets by anticoagulation protocol: Yes   Plan:  Give 4000 units bolus x 1 Start heparin infusion at 1400 units/hr Check anti-Xa level in 6 hours and daily while on heparin Continue to monitor H&H and platelets   2/7 AM heparin level 0.14. 3300 unit bolus and increase rate to 1800 units/hr. Recheck in 6 hours.  2/7: Heparin level resulted @ 0.62. Will continue current rate. Will recheck Heparin level @ 1930.   2/7@2000  HL 0.45, therapeutic, continue heparin iv 1800units/hr and recheck with am labs per protocol.   2/8: Heparin level in am therapeutic. Will continue current heparin gtt rate.  Klynn Linnemann D 05/21/2017,10:46 AM

## 2017-05-21 NOTE — Care Management (Signed)
Anticipate patient to discharge on Eliquis.  30 day coupon provided

## 2017-05-21 NOTE — Progress Notes (Signed)
[place] [facility] [place] NAME: Anthony Hart    MR#:  [id number]  DATE OF BIRTH:  1955/07/22  SUBJECTIVE: Patient admitted for recurrent right leg DVT, patient requesting vascular consult for thrombolysis.  Tentatively scheduled for next Wednesday.  Patient like to stay until then in the hospital because he says he cannot even move and feels a lot of pain.  CHIEF COMPLAINT:   Chief Complaint  Patient presents with  . Leg Pain    REVIEW OF SYSTEMS:   ROS CONSTITUTIONAL: No fever, fatigue or weakness.  EYES: No blurred or double vision.  EARS, NOSE, AND THROAT: No tinnitus or ear pain.  RESPIRATORY: No cough, shortness of breath, wheezing or hemoptysis.  CARDIOVASCULAR: No chest pain, orthopnea, edema.  GASTROINTESTINAL: No nausea, vomiting, diarrhea or abdominal pain.  GENITOURINARY: No dysuria, hematuria.  ENDOCRINE: No polyuria, nocturia,  HEMATOLOGY: No anemia, easy bruising or bleeding SKIN: No rash or lesion.  MUSCULOSKELETAL swelling, erythema of the right thigh no tenderness to palpation.   NEUROLOGIC: No tingling, numbness, weakness.  PSYCHIATRY: No anxiety or depression.   DRUG ALLERGIES:  No Known Allergies  VITALS:  Blood pressure 113/67, pulse 82, temperature 99.8 F (37.7 C), temperature source Oral, resp. rate 16, height 6\' 3"  (1.905 m), weight 116.1 kg (256 lb), SpO2 96 %.  PHYSICAL EXAMINATION:  GENERAL:  62 y.o.-year-old patient lying in the bed with no acute distress.  EYES: Pupils equal, round, reactive to light and accommodation. No scleral icterus. Extraocular muscles intact.  HEENT: Head atraumatic, normocephalic. Oropharynx and nasopharynx clear.  NECK:  Supple, no jugular venous distention. No thyroid enlargement, no tenderness.  LUNGS: Normal breath sounds bilaterally, no wheezing, rales,rhonchi or crepitation. No use of accessory muscles of respiration.  CARDIOVASCULAR: S1, S2 normal. No murmurs, rubs, or  gallops.  ABDOMEN: Soft, nontender, nondistended. Bowel sounds present. No organomegaly or mass.  EXTREMITIES:   NEUROLOGIC: Cranial nerves II through XII are intact. Muscle strength 5/5 in all extremities. Sensation intact. Gait not checked.  PSYCHIATRIC: The patient is alert and oriented x 3.  SKIN: No obvious rash, lesion, or ulcer.    LABORATORY PANEL:   CBC Recent Labs  Lab 05/21/17 0700  WBC 84.7*  HGB 13.4  HCT 41.7  PLT 111*   ------------------------------------------------------------------------------------------------------------------  Chemistries  Recent Labs  Lab 05/19/17 2154 05/20/17 0449  NA 139 139  K 5.0 4.7  CL 103 102  CO2 26 28  GLUCOSE 135* 232*  BUN 18 19  CREATININE 0.98 1.09  CALCIUM 8.7* 8.6*  AST 19  --   ALT 16*  --   ALKPHOS 53  --   BILITOT 1.0  --    ------------------------------------------------------------------------------------------------------------------  Cardiac Enzymes Recent Labs  Lab 05/19/17 2154  TROPONINI <0.03   ------------------------------------------------------------------------------------------------------------------  RADIOLOGY:  US Venous Img Lower Unilateral Right  Result Date: 05/19/2017 CLINICAL DATA:  Pain and swelling in RIGHT lower extremity, prior history of DVT and pulmonary embolism EXAM: RIGHT LOWER EXTREMITY VENOUS DOPPLER ULTRASOUND TECHNIQUE: Gray-scale sonography with graded compression, as well as color Doppler and duplex ultrasound were performed to evaluate the lower extremity deep venous systems from the level of the common femoral vein and including the common femoral, femoral, profunda femoral, popliteal and calf veins including the posterior tibial, peroneal and gastrocnemius veins when visible. The superficial great saphenous vein was also interrogated. Spectral Doppler was utilized to evaluate flow at rest and with distal augmentation maneuvers in the common femoral,  femoral and  popliteal veins. COMPARISON:  None. FINDINGS: Contralateral Common Femoral Vein: Respiratory phasicity is normal and symmetric with the symptomatic side. No evidence of thrombus. Normal compressibility. Common Femoral Vein: Occlusive hypoechoic thrombus, new. Noncompressible. Saphenofemoral Junction: Thrombus extending into greater saphenous vein confluence Profunda Femoral Vein: Occlusive hypoechoic thrombus, new. Noncompressible. Femoral Vein: Occlusive hypoechoic thrombus, new.  Noncompressible. Popliteal Vein: Occlusive hypoechoic thrombus, new. Noncompressible. Calf Veins: Unable to adequately visualize due to body habitus and swelling Superficial Great Saphenous Vein: Thrombus identified within greater saphenous vein at the RIGHT thigh. Venous Reflux:  N/A Other Findings:  N/A IMPRESSION: Extensive acute appearing deep venous thrombosis in the RIGHT lower extremity as above, new since 06/30/2016. Electronically Signed   By: Lavonia Dana M.D.   On: 05/19/2017 20:32    EKG:   Orders placed or performed during the hospital encounter of 05/19/17  . ED EKG  . 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 2.  CLL: Monitor closely [place].  #3 .diabetes mellitus type 2: Continue sliding scale insulin with coverage, hold 1 morning, blood pressure 1 start Lantus at night as per diabetic nurse recommendation, check hemoglobin A1c.  All the records are reviewed and case discussed with Care Management/Social Workerr. Management plans discussed with the patient, family and they are in agreement.  CODE STATUS: full  TOTAL TIME TAKING CARE OF THIS PATIENT: 2minutes.   POSSIBLE D/C IN 1-2 DAYS, DEPENDING ON CLINICAL CONDITION.   Epifanio Lesches M.D on 05/21/2017 at  6:01 PM  Between 7am to 6pm - Pager - [phone]  After 6pm go to [email or web address] - password [email or web address] [facility]  Office  [phone]  CC: Primary care physician; [name], MD   Note: This dictation was prepared with Dragon dictation along with smaller phrase technology. Any transcriptional errors that result from this process are unintentional.

## 2017-05-22 LAB — CBC
HCT: 43 % (ref 40.0–52.0)
HEMOGLOBIN: 13.8 g/dL (ref 13.0–18.0)
MCH: 28.8 pg (ref 26.0–34.0)
MCHC: 32.1 g/dL (ref 32.0–36.0)
MCV: 89.7 fL (ref 80.0–100.0)
PLATELETS: 128 10*3/uL — AB (ref 150–440)
RBC: 4.79 MIL/uL (ref 4.40–5.90)
RDW: 15.9 % — ABNORMAL HIGH (ref 11.5–14.5)
WBC: 85.5 10*3/uL — AB (ref 3.8–10.6)

## 2017-05-22 LAB — GLUCOSE, CAPILLARY
GLUCOSE-CAPILLARY: 156 mg/dL — AB (ref 65–99)
GLUCOSE-CAPILLARY: 245 mg/dL — AB (ref 65–99)
GLUCOSE-CAPILLARY: 114 mg/dL — AB (ref 65–99)
GLUCOSE-CAPILLARY: 155 mg/dL — AB (ref 65–99)

## 2017-05-22 LAB — HEPARIN LEVEL (UNFRACTIONATED)
HEPARIN UNFRACTIONATED: 0.2 [IU]/mL — AB (ref 0.30–0.70)
Heparin Unfractionated: 0.2 IU/mL — ABNORMAL LOW (ref 0.30–0.70)
Heparin Unfractionated: 0.3 IU/mL (ref 0.30–0.70)

## 2017-05-22 MED ORDER — HEPARIN BOLUS VIA INFUSION
1500.0000 [IU] | Freq: Once | INTRAVENOUS | Status: AC
  Administered 2017-05-22: 1500 [IU] via INTRAVENOUS
  Filled 2017-05-22: qty 1500

## 2017-05-22 MED ORDER — HEPARIN BOLUS VIA INFUSION
1600.0000 [IU] | Freq: Once | INTRAVENOUS | Status: AC
  Administered 2017-05-22: 1600 [IU] via INTRAVENOUS
  Filled 2017-05-22: qty 1600

## 2017-05-22 NOTE — Progress Notes (Signed)
[facility]  Daily Progress Note   Will plan on right lower extremity venous lysis with Dr. Lucky Cowboy on Monday 05/24/17.  Marcelle Overlie PA-C 05/22/2017 1:44 PM

## 2017-05-22 NOTE — Progress Notes (Signed)
[facility] NAME: Anthony Hart    MR#:  [id number]  DATE OF BIRTH:  1955-08-19  SUBJECTIVE:  CHIEF COMPLAINT:   Chief Complaint  Patient presents with  . Leg Pain  Patient without complaint, for venous lysis on Monday  REVIEW OF SYSTEMS:  CONSTITUTIONAL: No fever, fatigue or weakness.  EYES: No blurred or double vision.  EARS, NOSE, AND THROAT: No tinnitus or ear pain.  RESPIRATORY: No cough, shortness of breath, wheezing or hemoptysis.  CARDIOVASCULAR: No chest pain, orthopnea, edema.  GASTROINTESTINAL: No nausea, vomiting, diarrhea or abdominal pain.  GENITOURINARY: No dysuria, hematuria.  ENDOCRINE: No polyuria, nocturia,  HEMATOLOGY: No anemia, easy bruising or bleeding SKIN: No rash or lesion. MUSCULOSKELETAL: No joint pain or arthritis.   NEUROLOGIC: No tingling, numbness, weakness.  PSYCHIATRY: No anxiety or depression.   ROS  DRUG ALLERGIES:  No Known Allergies  VITALS:  Blood pressure 104/61, pulse 77, temperature 98.4 F (36.9 C), temperature source Oral, resp. rate 20, height 6\' 3"  (1.905 m), weight 116.1 kg (256 lb), SpO2 98 %.  PHYSICAL EXAMINATION:  GENERAL:  62 y.o.-year-old patient lying in the bed with no acute distress.  EYES: Pupils equal, round, reactive to light and accommodation. No scleral icterus. Extraocular muscles intact.  HEENT: Head atraumatic, normocephalic. Oropharynx and nasopharynx clear.  NECK:  Supple, no jugular venous distention. No thyroid enlargement, no tenderness.  LUNGS: Normal breath sounds bilaterally, no wheezing, rales,rhonchi or crepitation. No use of accessory muscles of respiration.  CARDIOVASCULAR: S1, S2 normal. No murmurs, rubs, or gallops.  ABDOMEN: Soft, nontender, nondistended. Bowel sounds present. No organomegaly or mass.  EXTREMITIES: No pedal edema, cyanosis, or clubbing.  NEUROLOGIC: Cranial nerves II through XII are intact. Muscle strength 5/5 in all  extremities. Sensation intact. Gait not checked.  PSYCHIATRIC: The patient is alert and oriented x 3.  SKIN: No obvious rash, lesion, or ulcer.   Physical Exam LABORATORY PANEL:   CBC Recent Labs  Lab 05/22/17 0450  WBC 85.5*  HGB 13.8  HCT 43.0  PLT 128*   ------------------------------------------------------------------------------------------------------------------  Chemistries  Recent Labs  Lab 05/19/17 2154 05/20/17 0449  NA 139 139  K 5.0 4.7  CL 103 102  CO2 26 28  GLUCOSE 135* 232*  BUN 18 19  CREATININE 0.98 1.09  CALCIUM 8.7* 8.6*  AST 19  --   ALT 16*  --   ALKPHOS 53  --   BILITOT 1.0  --    ------------------------------------------------------------------------------------------------------------------  Cardiac Enzymes Recent Labs  Lab 05/19/17 2154  TROPONINI <0.03   ------------------------------------------------------------------------------------------------------------------  RADIOLOGY:  No results found.  ASSESSMENT AND PLAN:  1 acute recurrent right DVT  For venous lysis on Monday by vascular surgery Continue heparin drip  2CLL Stable We will need to follow-up with Dr. [name] status post discharge  3 chronic diabetes mellitus type 2  Controlled on current regiment   All the records are reviewed and case discussed with Care Management/Social Workerr. Management plans discussed with the patient, family and they are in agreement.  CODE STATUS: full  TOTAL TIME TAKING CARE OF THIS PATIENT: 35 minutes.     POSSIBLE D/C IN 2-3 DAYS, DEPENDING ON CLINICAL CONDITION.   Avel Peace Damaria Stofko M.D on 05/22/2017   Between 7am to 6pm - Pager - [phone]  After 6pm go to [email or web address] - password [email or web address] [facility]  Office  [phone]  CC: Primary care physician; [name], MD  Note: This dictation was prepared with Dragon dictation along with smaller phrase technology. Any transcriptional  errors that result from this process are unintentional.

## 2017-05-22 NOTE — Progress Notes (Signed)
ANTICOAGULATION CONSULT NOTE - Initial Consult  Pharmacy Consult for Heparin  Indication: chest pain/ACS  No Known Allergies  Patient Measurements: Height: 6\' 3"  (190.5 cm) Weight: 256 lb (116.1 kg) IBW/kg (Calculated) : 84.5 Heparin Dosing Weight: 108.8 kg   Vital Signs: Temp: 98.4 F (36.9 C) (02/09 0603) Temp Source: Oral (02/09 0603) BP: 104/61 (02/09 0603) Pulse Rate: 77 (02/09 0603)  Labs: Recent Labs    05/19/17 2154 05/20/17 0449  05/20/17 1910 05/21/17 0700 05/22/17 0450  HGB 13.5 12.5*  --   --  13.4 13.8  HCT 42.6 39.7*  --   --  41.7 43.0  PLT 112* 112*  --   --  111* 128*  LABPROT 14.4  --   --   --   --   --   INR 1.13  --   --   --   --   --   HEPARINUNFRC  --  0.14*   < > 0.45 0.37 0.20*  CREATININE 0.98 1.09  --   --   --   --   TROPONINI <0.03  --   --   --   --   --    < > = values in this interval not displayed.    Estimated Creatinine Clearance: 100.2 mL/min (by C-G formula based on SCr of 1.09 mg/dL).   Medical History: Past Medical History:  Diagnosis Date  . Cancer ([place])    CLL monitored   . Clotting disorder ([place])   . Diabetes mellitus without complication ([place])    Type II    Medications:  Medications Prior to Admission  Medication Sig Dispense Refill Last Dose  . metFORMIN (GLUCOPHAGE-XR) 750 MG 24 hr tablet Take 750 mg by mouth daily.   05/18/2017 at Unknown time  . methocarbamol (ROBAXIN) 500 MG tablet Take 1 tablet (500 mg total) by mouth every 6 (six) hours as needed for muscle spasms. (Patient not taking: Reported on 05/19/2017) 12 tablet 0 Not Taking at Unknown time  . predniSONE (DELTASONE) 10 MG tablet Take 6 tablets  today, on day 2 take 5 tablets, day 3 take 4 tablets, day 4 take 3 tablets, day 5 take  2 tablets and 1 tablet the last day (Patient not taking: Reported on 05/19/2017) 21 tablet 0 Not Taking at Unknown time  . warfarin (COUMADIN) 7.5 MG tablet Take 1 tablet (7.5 mg total) by mouth daily at 6 PM. (Patient not taking:  Reported on 05/19/2017) 20 tablet 0 Not Taking at Unknown time    Assessment: Pharmacy consulted to dose heparin in this 62 year old male admitted with NSTEMI.  PTA med list says pt was on started on warfarin in 7/18.  Pt states he hasnt taken warfarin in 4 months.  CrCl = ?   Goal of Therapy:  Heparin level 0.3-0.7 units/ml Monitor platelets by anticoagulation protocol: Yes   Plan:  Give 4000 units bolus x 1 Start heparin infusion at 1400 units/hr Check anti-Xa level in 6 hours and daily while on heparin Continue to monitor H&H and platelets   2/7 AM heparin level 0.14. 3300 unit bolus and increase rate to 1800 units/hr. Recheck in 6 hours.  2/7: Heparin level resulted @ 0.62. Will continue current rate. Will recheck Heparin level @ 1930.   2/7@2000  HL 0.45, therapeutic, continue heparin iv 1800units/hr and recheck with am labs per protocol.   2/8: Heparin level in am therapeutic. Will continue current heparin gtt rate.   2/9 AM heparin level  0.20. 1600 unit bolus and increase rate to 2000 units/hr. Recheck in 6 hours.  Anthony Hart 05/22/2017,6:09 AM

## 2017-05-22 NOTE — Progress Notes (Addendum)
ANTICOAGULATION CONSULT NOTE   Pharmacy Consult for Heparin  Indication: chest pain/ACS  No Known Allergies  Patient Measurements: Height: 6\' 3"  (190.5 cm) Weight: 256 lb (116.1 kg) IBW/kg (Calculated) : 84.5 Heparin Dosing Weight: 108.8 kg   Vital Signs:    Labs: Recent Labs    05/19/17 2154 05/20/17 0449  05/21/17 0700 05/22/17 0450 05/22/17 1227 05/22/17 1921  HGB 13.5 12.5*  --  13.4 13.8  --   --   HCT 42.6 39.7*  --  41.7 43.0  --   --   PLT 112* 112*  --  111* 128*  --   --   LABPROT 14.4  --   --   --   --   --   --   INR 1.13  --   --   --   --   --   --   HEPARINUNFRC  --  0.14*   < > 0.37 0.20* 0.30 0.20*  CREATININE 0.98 1.09  --   --   --   --   --   TROPONINI <0.03  --   --   --   --   --   --    < > = values in this interval not displayed.    Estimated Creatinine Clearance: 100.2 mL/min (by C-G formula based on SCr of 1.09 mg/dL).   Medical History: Past Medical History:  Diagnosis Date  . Cancer ([place])    CLL monitored   . Clotting disorder ([place])   . Diabetes mellitus without complication ([place])    Type II    Medications:  Medications Prior to Admission  Medication Sig Dispense Refill Last Dose  . metFORMIN (GLUCOPHAGE-XR) 750 MG 24 hr tablet Take 750 mg by mouth daily.   05/18/2017 at Unknown time  . methocarbamol (ROBAXIN) 500 MG tablet Take 1 tablet (500 mg total) by mouth every 6 (six) hours as needed for muscle spasms. (Patient not taking: Reported on 05/19/2017) 12 tablet 0 Not Taking at Unknown time  . predniSONE (DELTASONE) 10 MG tablet Take 6 tablets  today, on day 2 take 5 tablets, day 3 take 4 tablets, day 4 take 3 tablets, day 5 take  2 tablets and 1 tablet the last day (Patient not taking: Reported on 05/19/2017) 21 tablet 0 Not Taking at Unknown time  . warfarin (COUMADIN) 7.5 MG tablet Take 1 tablet (7.5 mg total) by mouth daily at 6 PM. (Patient not taking: Reported on 05/19/2017) 20 tablet 0 Not Taking at Unknown time     Assessment: Pharmacy consulted to dose heparin in this 62 year old male admitted with NSTEMI.  PTA med list says pt was on started on warfarin in 7/18.  Pt states he hasnt taken warfarin in 4 months.    Goal of Therapy:  Heparin level 0.3-0.7 units/ml Monitor platelets by anticoagulation protocol: Yes   Plan:  2/8: Heparin level in am therapeutic. Will continue current heparin gtt rate.   2/9 AM heparin level 0.20. 1600 unit bolus and increase rate to 2000 units/hr. Recheck in 6 hours.  2/9 HL @ 1227= 0.30. Barely therapeutic. Will slightly increase drip to 2050 units/hr. Will check confirmatory HL in 6 hrs.  2/9 HL @ 1921 =0.20 Level in subtherapeutic. Nurse reports there has been no interruption in heparin therapy. Will bolus 1500 units and increase infusion to 2250 units/hr and recheck HL in 6 hours.   2/10 0230 heparin level 0.29. 1600 unit bolus and increase rate to  2450 units/hr. Recheck in 6 hours.  Pernell Dupre, PharmD, BCPS Clinical Pharmacist 05/22/2017 8:16 PM

## 2017-05-22 NOTE — Progress Notes (Signed)
ANTICOAGULATION CONSULT NOTE   Pharmacy Consult for Heparin  Indication: chest pain/ACS  No Known Allergies  Patient Measurements: Height: 6\' 3"  (190.5 cm) Weight: 256 lb (116.1 kg) IBW/kg (Calculated) : 84.5 Heparin Dosing Weight: 108.8 kg   Vital Signs: Temp: 98.4 F (36.9 C) (02/09 0603) Temp Source: Oral (02/09 0603) BP: 104/61 (02/09 0603) Pulse Rate: 77 (02/09 0603)  Labs: Recent Labs    05/19/17 2154 05/20/17 0449  05/21/17 0700 05/22/17 0450 05/22/17 1227  HGB 13.5 12.5*  --  13.4 13.8  --   HCT 42.6 39.7*  --  41.7 43.0  --   PLT 112* 112*  --  111* 128*  --   LABPROT 14.4  --   --   --   --   --   INR 1.13  --   --   --   --   --   HEPARINUNFRC  --  0.14*   < > 0.37 0.20* 0.30  CREATININE 0.98 1.09  --   --   --   --   TROPONINI <0.03  --   --   --   --   --    < > = values in this interval not displayed.    Estimated Creatinine Clearance: 100.2 mL/min (by C-G formula based on SCr of 1.09 mg/dL).   Medical History: Past Medical History:  Diagnosis Date  . Cancer ([place])    CLL monitored   . Clotting disorder ([place])   . Diabetes mellitus without complication ([place])    Type II    Medications:  Medications Prior to Admission  Medication Sig Dispense Refill Last Dose  . metFORMIN (GLUCOPHAGE-XR) 750 MG 24 hr tablet Take 750 mg by mouth daily.   05/18/2017 at Unknown time  . methocarbamol (ROBAXIN) 500 MG tablet Take 1 tablet (500 mg total) by mouth every 6 (six) hours as needed for muscle spasms. (Patient not taking: Reported on 05/19/2017) 12 tablet 0 Not Taking at Unknown time  . predniSONE (DELTASONE) 10 MG tablet Take 6 tablets  today, on day 2 take 5 tablets, day 3 take 4 tablets, day 4 take 3 tablets, day 5 take  2 tablets and 1 tablet the last day (Patient not taking: Reported on 05/19/2017) 21 tablet 0 Not Taking at Unknown time  . warfarin (COUMADIN) 7.5 MG tablet Take 1 tablet (7.5 mg total) by mouth daily at 6 PM. (Patient not taking: Reported on  05/19/2017) 20 tablet 0 Not Taking at Unknown time    Assessment: Pharmacy consulted to dose heparin in this 62 year old male admitted with NSTEMI.  PTA med list says pt was on started on warfarin in 7/18.  Pt states he hasnt taken warfarin in 4 months.  CrCl = ?   Goal of Therapy:  Heparin level 0.3-0.7 units/ml Monitor platelets by anticoagulation protocol: Yes   Plan:  Give 4000 units bolus x 1 Start heparin infusion at 1400 units/hr Check anti-Xa level in 6 hours and daily while on heparin Continue to monitor H&H and platelets   2/7 AM heparin level 0.14. 3300 unit bolus and increase rate to 1800 units/hr. Recheck in 6 hours.  2/7: Heparin level resulted @ 0.62. Will continue current rate. Will recheck Heparin level @ 1930.   2/7@2000  HL 0.45, therapeutic, continue heparin iv 1800units/hr and recheck with am labs per protocol.   2/8: Heparin level in am therapeutic. Will continue current heparin gtt rate.   2/9 AM heparin level 0.20. 1600  unit bolus and increase rate to 2000 units/hr. Recheck in 6 hours.  2/9 HL @ 1227= 0.30. Barely therapeutic. Will slightly increase drip to 2050 units/hr. Will check confirmatory HL in 6 hrs.  Christifer Chapdelaine A 05/22/2017,1:14 PM

## 2017-05-23 ENCOUNTER — Inpatient Hospital Stay: Payer: Medicaid Other

## 2017-05-23 DIAGNOSIS — I82401 Acute embolism and thrombosis of unspecified deep veins of right lower extremity: Principal | ICD-10-CM

## 2017-05-23 LAB — CREATININE, SERUM
CREATININE: 1.22 mg/dL (ref 0.61–1.24)
GFR calc Af Amer: >60 mL/min (ref >60)
GFR calc non Af Amer: >60 mL/min (ref >60)

## 2017-05-23 LAB — GLUCOSE, CAPILLARY
GLUCOSE-CAPILLARY: 219 mg/dL — AB (ref 65–99)
GLUCOSE-CAPILLARY: 108 mg/dL — AB (ref 65–99)
Glucose-Capillary: 142 mg/dL — ABNORMAL HIGH (ref 65–99)
Glucose-Capillary: 146 mg/dL — ABNORMAL HIGH (ref 65–99)

## 2017-05-23 LAB — URINALYSIS, COMPLETE (UACMP) WITH MICROSCOPIC
Bacteria, UA: NONE SEEN
Bilirubin Urine: NEGATIVE
GLUCOSE, UA: NEGATIVE mg/dL
Ketones, ur: NEGATIVE mg/dL
Leukocytes, UA: NEGATIVE
Nitrite: NEGATIVE
Protein, ur: NEGATIVE mg/dL
SPECIFIC GRAVITY, URINE: 1.02 (ref 1.005–1.030)
pH: 5 (ref 5.0–8.0)

## 2017-05-23 LAB — HEPARIN LEVEL (UNFRACTIONATED)
HEPARIN UNFRACTIONATED: 0.29 [IU]/mL — AB (ref 0.30–0.70)
HEPARIN UNFRACTIONATED: 0.32 [IU]/mL (ref 0.30–0.70)
Heparin Unfractionated: 0.35 IU/mL (ref 0.30–0.70)

## 2017-05-23 LAB — CBC
HCT: 41.7 % (ref 40.0–52.0)
HEMOGLOBIN: 13.3 g/dL (ref 13.0–18.0)
MCH: 28.5 pg (ref 26.0–34.0)
MCHC: 32 g/dL (ref 32.0–36.0)
MCV: 89.2 fL (ref 80.0–100.0)
Platelets: 136 10*3/uL — ABNORMAL LOW (ref 150–440)
RBC: 4.67 MIL/uL (ref 4.40–5.90)
RDW: 15 % — ABNORMAL HIGH (ref 11.5–14.5)
WBC: 94.3 10*3/uL — AB (ref 3.8–10.6)

## 2017-05-23 LAB — STREP PNEUMONIAE URINARY ANTIGEN: STREP PNEUMO URINARY ANTIGEN: NEGATIVE

## 2017-05-23 MED ORDER — VANCOMYCIN HCL 10 G IV SOLR
1500.0000 mg | Freq: Two times a day (BID) | INTRAVENOUS | Status: DC
  Administered 2017-05-24 (×2): 1500 mg via INTRAVENOUS
  Filled 2017-05-23 (×5): qty 1500

## 2017-05-23 MED ORDER — DEXTROSE 5 % IV SOLN
1.0000 g | Freq: Three times a day (TID) | INTRAVENOUS | Status: DC
  Administered 2017-05-23 – 2017-05-24 (×3): 1 g via INTRAVENOUS
  Filled 2017-05-23 (×5): qty 1

## 2017-05-23 MED ORDER — VANCOMYCIN HCL IN DEXTROSE 1-5 GM/200ML-% IV SOLN
1000.0000 mg | Freq: Once | INTRAVENOUS | Status: AC
  Administered 2017-05-23: 1000 mg via INTRAVENOUS
  Filled 2017-05-23: qty 200

## 2017-05-23 MED ORDER — HEPARIN BOLUS VIA INFUSION
1600.0000 [IU] | Freq: Once | INTRAVENOUS | Status: AC
  Administered 2017-05-23: 1600 [IU] via INTRAVENOUS
  Filled 2017-05-23: qty 1600

## 2017-05-23 NOTE — Progress Notes (Signed)
ANTICOAGULATION CONSULT NOTE   Pharmacy Consult for Heparin  Indication: chest pain/ACS  No Known Allergies  Patient Measurements: Height: 6\' 3"  (190.5 cm) Weight: 256 lb (116.1 kg) IBW/kg (Calculated) : 84.5 Heparin Dosing Weight: 108.8 kg   Vital Signs: Temp: 98.6 F (37 C) (02/10 0623) Temp Source: Oral (02/10 0623) BP: 105/63 (02/10 0623) Pulse Rate: 71 (02/10 0623)  Labs: Recent Labs    05/21/17 0700 05/22/17 0450  05/22/17 1921 05/23/17 0233 05/23/17 0947  HGB 13.4 13.8  --   --  13.3  --   HCT 41.7 43.0  --   --  41.7  --   PLT 111* 128*  --   --  136*  --   HEPARINUNFRC 0.37 0.20*   < > 0.20* 0.29* 0.32   < > = values in this interval not displayed.    Estimated Creatinine Clearance: 100.2 mL/min (by C-G formula based on SCr of 1.09 mg/dL).   Medical History: Past Medical History:  Diagnosis Date  . Cancer ([place])    CLL monitored   . Clotting disorder ([place])   . Diabetes mellitus without complication ([place])    Type II    Medications:  Medications Prior to Admission  Medication Sig Dispense Refill Last Dose  . metFORMIN (GLUCOPHAGE-XR) 750 MG 24 hr tablet Take 750 mg by mouth daily.   05/18/2017 at Unknown time  . methocarbamol (ROBAXIN) 500 MG tablet Take 1 tablet (500 mg total) by mouth every 6 (six) hours as needed for muscle spasms. (Patient not taking: Reported on 05/19/2017) 12 tablet 0 Not Taking at Unknown time  . predniSONE (DELTASONE) 10 MG tablet Take 6 tablets  today, on day 2 take 5 tablets, day 3 take 4 tablets, day 4 take 3 tablets, day 5 take  2 tablets and 1 tablet the last day (Patient not taking: Reported on 05/19/2017) 21 tablet 0 Not Taking at Unknown time  . warfarin (COUMADIN) 7.5 MG tablet Take 1 tablet (7.5 mg total) by mouth daily at 6 PM. (Patient not taking: Reported on 05/19/2017) 20 tablet 0 Not Taking at Unknown time    Assessment: Pharmacy consulted to dose heparin in this 62 year old male admitted with NSTEMI.  PTA med list says  pt was on started on warfarin in 7/18.  Pt states he hasnt taken warfarin in 4 months.    Goal of Therapy:  Heparin level 0.3-0.7 units/ml Monitor platelets by anticoagulation protocol: Yes   Plan:  2/8: Heparin level in am therapeutic. Will continue current heparin gtt rate.   2/9 AM heparin level 0.20. 1600 unit bolus and increase rate to 2000 units/hr. Recheck in 6 hours.  2/9 HL @ 1227= 0.30. Barely therapeutic. Will slightly increase drip to 2050 units/hr. Will check confirmatory HL in 6 hrs.  2/9 HL @ 1921 =0.20 Level in subtherapeutic. Nurse reports there has been no interruption in heparin therapy. Will bolus 1500 units and increase infusion to 2250 units/hr and recheck HL in 6 hours.   2/10 0230 heparin level 0.29. 1600 unit bolus and increase rate to 2450 units/hr. Recheck in 6 hours.  2/10 HL@ 0947= 0.32.  Will continue current drip rate and check a confirmatory level in 6 hrs.   Noralee Space, PharmD, BCPS Clinical Pharmacist 05/23/2017 10:48 AM

## 2017-05-23 NOTE — Consult Note (Signed)
Pharmacy Antibiotic Note  Anthony Hart. is a 62 y.o. male admitted on 05/19/2017 with ACS/Chest pain and pneumonia.  Pharmacy has been consulted for Vancomycin  Dosing. Patient is also receiving Cefepime 1g IV every 8 hours. Patient received vancomycin 1g IV x 1 dose.   Plan: Ke: 0.079   Vd: 81.2   T1/2: 8.8  Start vancomycin 1500mg  IV every 12 hours with 6 hour stack dosing. Goal Trough: 15-20. Trough level ordered prior to 4th dose. Will monitor renal function and adjust dose as needed.    Height: 6\' 3"  (190.5 cm) Weight: 256 lb (116.1 kg) IBW/kg (Calculated) : 84.5  Temp (24hrs), Avg:99.3 F (37.4 C), Min:98.6 F (37 C), Max:100.5 F (38.1 C)  Recent Labs  Lab 05/19/17 2154 05/20/17 0449 05/21/17 0700 05/22/17 0450 05/23/17 0233 05/23/17 1613  WBC 95.8* 87.8* 84.7* 85.5* 94.3*  --   CREATININE 0.98 1.09  --   --   --  1.22    Estimated Creatinine Clearance: 89.5 mL/min (by C-G formula based on SCr of 1.22 mg/dL).    No Known Allergies  Antimicrobials this admission: 2/10 cefepime >>  2/10 vancomycin  >>   Dose adjustments this admission:  Microbiology results: 2/10  BCx: pending  Thank you for allowing pharmacy to be a part of this patient's care.  Pernell Dupre, PharmD, BCPS Clinical Pharmacist 05/23/2017 5:49 PM

## 2017-05-23 NOTE — Progress Notes (Signed)
[facility]  Daily Progress Note   Subjective  - * No surgery found *  Leg a little less swollen and painful on heparin and keeping leg elevated  Objective Vitals:   05/21/17 2014 05/22/17 0603 05/22/17 2131 05/23/17 0623  BP: 118/70 104/61 116/72 105/63  Pulse: 91 77 94 71  Resp: 19 20 18 19   Temp: (!) 101.1 F (38.4 C) 98.4 F (36.9 C) (!) 100.5 F (38.1 C) 98.6 F (37 C)  TempSrc: Oral Oral Oral Oral  SpO2: 96% 98% 97% 97%  Weight:      Height:        Intake/Output Summary (Last 24 hours) at 05/23/2017 0924 Last data filed at 05/23/2017 8502 Gross per 24 hour  Intake 1336 ml  Output 2300 ml  Net -964 ml    PULM  CTAB CV  RRR VASC  2-3+ RLE swelling.  Pulses present.  Laboratory CBC    Component Value Date/Time   WBC 94.3 (HH) 05/23/2017 0233   HGB 13.3 05/23/2017 0233   HGB 14.3 01/08/2014 1509   HCT 41.7 05/23/2017 0233   HCT 45.0 01/08/2014 1509   PLT 136 (L) 05/23/2017 0233   PLT 138 (L) 01/08/2014 1509    BMET    Component Value Date/Time   NA 139 05/20/2017 0449   NA 141 12/23/2013 1350   K 4.7 05/20/2017 0449   K 4.9 12/23/2013 1350   CL 102 05/20/2017 0449   CL 108 (H) 12/23/2013 1350   CO2 28 05/20/2017 0449   CO2 28 12/23/2013 1350   GLUCOSE 232 (H) 05/20/2017 0449   GLUCOSE 90 12/23/2013 1350   BUN 19 05/20/2017 0449   BUN 9 12/23/2013 1350   CREATININE 1.09 05/20/2017 0449   CREATININE 1.17 12/23/2013 1350   CALCIUM 8.6 (L) 05/20/2017 0449   CALCIUM 9.1 12/23/2013 1350   GFRNONAA >60 05/20/2017 0449   GFRNONAA >60 12/23/2013 1350   GFRAA >60 05/20/2017 0449   GFRAA >60 12/23/2013 1350    Assessment/Planning:    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  He desires repeat intervention and thrombolysis/thrombectomy can be performed tomorrow.  Risks and benefits discussed  Discussed likely less improvement than at previous intervention  given post-phlebitic issues and recurrent disease.    Anthony Hart  05/23/2017, 9:24 AM

## 2017-05-23 NOTE — Progress Notes (Signed)
[facility] [place] NAME: Anthony Hart    MR#:  [id number]  DATE OF BIRTH:  [birth year]  SUBJECTIVE:  CHIEF COMPLAINT:   Chief Complaint  Patient presents with  . Leg Pain  Fever of 100.5 noted, fever workup initiated, chest x-ray noted for probable pneumonia-pneumonia protocol initiated  REVIEW OF SYSTEMS:  CONSTITUTIONAL: No fever, fatigue or weakness.  EYES: No blurred or double vision.  EARS, NOSE, AND THROAT: No tinnitus or ear pain.  RESPIRATORY: No cough, shortness of breath, wheezing or hemoptysis.  CARDIOVASCULAR: No chest pain, orthopnea, edema.  GASTROINTESTINAL: No nausea, vomiting, diarrhea or abdominal pain.  GENITOURINARY: No dysuria, hematuria.  ENDOCRINE: No polyuria, nocturia,  HEMATOLOGY: No anemia, easy bruising or bleeding SKIN: No rash or lesion. MUSCULOSKELETAL: No joint pain or arthritis.   NEUROLOGIC: No tingling, numbness, weakness.  PSYCHIATRY: No anxiety or depression.   ROS  DRUG ALLERGIES:  No Known Allergies  VITALS:  Blood pressure (!) 113/57, pulse 83, temperature 98.8 F (37.1 C), temperature source Oral, resp. rate 18, height 6\' 3"  (1.905 m), weight 116.1 kg (256 lb), SpO2 98 %.  PHYSICAL EXAMINATION:  GENERAL:  62 y.o.-year-old patient lying in the bed with no acute distress.  EYES: Pupils equal, round, reactive to light and accommodation. No scleral icterus. Extraocular muscles intact.  HEENT: Head atraumatic, normocephalic. Oropharynx and nasopharynx clear.  NECK:  Supple, no jugular venous distention. No thyroid enlargement, no tenderness.  LUNGS: Normal breath sounds bilaterally, no wheezing, rales,rhonchi or crepitation. No use of accessory muscles of respiration.  CARDIOVASCULAR: S1, S2 normal. No murmurs, rubs, or gallops.  ABDOMEN: Soft, nontender, nondistended. Bowel sounds present. No organomegaly or mass.  EXTREMITIES: No pedal edema, cyanosis, or clubbing.  NEUROLOGIC: Cranial nerves  II through XII are intact. Muscle strength 5/5 in all extremities. Sensation intact. Gait not checked.  PSYCHIATRIC: The patient is alert and oriented x 3.  SKIN: No obvious rash, lesion, or ulcer.   Physical Exam LABORATORY PANEL:   CBC Recent Labs  Lab 05/23/17 0233  WBC 94.3*  HGB 13.3  HCT 41.7  PLT 136*   ------------------------------------------------------------------------------------------------------------------  Chemistries  Recent Labs  Lab 05/19/17 2154 05/20/17 0449  NA 139 139  K 5.0 4.7  CL 103 102  CO2 26 28  GLUCOSE 135* 232*  BUN 18 19  CREATININE 0.98 1.09  CALCIUM 8.7* 8.6*  AST 19  --   ALT 16*  --   ALKPHOS 53  --   BILITOT 1.0  --    ------------------------------------------------------------------------------------------------------------------  Cardiac Enzymes Recent Labs  Lab 05/19/17 2154  TROPONINI <0.03   ------------------------------------------------------------------------------------------------------------------  RADIOLOGY:  Dg Chest 2 View  Result Date: 05/23/2017 CLINICAL DATA:  Fever EXAM: CHEST  2 VIEW COMPARISON:  Chest CT 10/29/2016 FINDINGS: Patchy opacity in the right lower lung could reflect atelectasis or pneumonia. Left lung clear. Heart is normal size. No effusions or acute bony abnormality. IMPRESSION: Right lower lung airspace opacity, atelectasis versus pneumonia. Electronically Signed   By: Rolm Baptise M.D.   On: 05/23/2017 09:42    ASSESSMENT AND PLAN:  1  acute HCAP Acquired in the hospital on May 23, 2017 Chest x-ray noted for pneumonia We will start pneumonia protocol, vancomycin/cefepime for 5-7-day course, follow-up on cultures  2 acute recurrent right DVT  Stable For venous lysis/thrombectomy on Monday by vascular surgery Continue heparin drip  2CLL Stable We will need to follow-up with Dr. [name] status post discharge  3  chronic diabetes mellitus type 2  Controlled on current  regiment    All the records are reviewed and case discussed with Care Management/Social Workerr. Management plans discussed with the patient, family and they are in agreement.  CODE STATUS: full  TOTAL TIME TAKING CARE OF THIS PATIENT: 35 minutes.     POSSIBLE D/C IN 2-5 DAYS, DEPENDING ON CLINICAL CONDITION.   Avel Peace Salary M.D on 05/23/2017   Between 7am to 6pm - Pager - [phone]  After 6pm go to [email or web address] - password [email or web address] [facility]  Office  [phone]  CC: Primary care physician; [name], MD  Note: This dictation was prepared with Dragon dictation along with smaller phrase technology. Any transcriptional errors that result from this process are unintentional.

## 2017-05-23 NOTE — Progress Notes (Signed)
ANTICOAGULATION CONSULT NOTE   Pharmacy Consult for Heparin  Indication: chest pain/ACS  No Known Allergies  Patient Measurements: Height: 6\' 3"  (190.5 cm) Weight: 256 lb (116.1 kg) IBW/kg (Calculated) : 84.5 Heparin Dosing Weight: 108.8 kg   Vital Signs: Temp: 98.8 F (37.1 C) (02/10 1400) Temp Source: Oral (02/10 1400) BP: 113/57 (02/10 1400) Pulse Rate: 83 (02/10 1400)  Labs: Recent Labs    05/21/17 0700 05/22/17 0450  05/23/17 0233 05/23/17 0947 05/23/17 1613  HGB 13.4 13.8  --  13.3  --   --   HCT 41.7 43.0  --  41.7  --   --   PLT 111* 128*  --  136*  --   --   HEPARINUNFRC 0.37 0.20*   < > 0.29* 0.32 0.35  CREATININE  --   --   --   --   --  1.22   < > = values in this interval not displayed.    Estimated Creatinine Clearance: 89.5 mL/min (by C-G formula based on SCr of 1.22 mg/dL).   Medical History: Past Medical History:  Diagnosis Date  . Cancer ([place])    CLL monitored   . Clotting disorder ([place])   . Diabetes mellitus without complication ([place])    Type II    Medications:  Medications Prior to Admission  Medication Sig Dispense Refill Last Dose  . metFORMIN (GLUCOPHAGE-XR) 750 MG 24 hr tablet Take 750 mg by mouth daily.   05/18/2017 at Unknown time  . methocarbamol (ROBAXIN) 500 MG tablet Take 1 tablet (500 mg total) by mouth every 6 (six) hours as needed for muscle spasms. (Patient not taking: Reported on 05/19/2017) 12 tablet 0 Not Taking at Unknown time  . predniSONE (DELTASONE) 10 MG tablet Take 6 tablets  today, on day 2 take 5 tablets, day 3 take 4 tablets, day 4 take 3 tablets, day 5 take  2 tablets and 1 tablet the last day (Patient not taking: Reported on 05/19/2017) 21 tablet 0 Not Taking at Unknown time  . warfarin (COUMADIN) 7.5 MG tablet Take 1 tablet (7.5 mg total) by mouth daily at 6 PM. (Patient not taking: Reported on 05/19/2017) 20 tablet 0 Not Taking at Unknown time    Assessment: Pharmacy consulted to dose heparin in this 62 year old male  admitted with NSTEMI.  PTA med list says pt was on started on warfarin in 7/18.  Pt states he hasnt taken warfarin in 4 months.    Goal of Therapy:  Heparin level 0.3-0.7 units/ml Monitor platelets by anticoagulation protocol: Yes   Plan:  2/8: Heparin level in am therapeutic. Will continue current heparin gtt rate.   2/9 AM heparin level 0.20. 1600 unit bolus and increase rate to 2000 units/hr. Recheck in 6 hours.  2/9 HL @ 1227= 0.30. Barely therapeutic. Will slightly increase drip to 2050 units/hr. Will check confirmatory HL in 6 hrs.  2/9 HL @ 1921 =0.20 Level in subtherapeutic. Nurse reports there has been no interruption in heparin therapy. Will bolus 1500 units and increase infusion to 2250 units/hr and recheck HL in 6 hours.   2/10 0230 heparin level 0.29. 1600 unit bolus and increase rate to 2450 units/hr. Recheck in 6 hours.  2/10 HL@ 0947= 0.32.  Will continue current drip rate and check a confirmatory level in 6 hrs.   2/10 HL @ 1730 = 0.35. Will continue current drip rate and check a HL tomorrow.   Lendon Ka, PharmD Pharmacy Resident 05/23/2017 5:32 PM

## 2017-05-24 ENCOUNTER — Encounter
Admission: EM | Disposition: A | Discharge status: Home / Self Care | Payer: Self-pay | Source: Home / Self Care | Attending: Family Medicine

## 2017-05-24 ENCOUNTER — Ambulatory Visit: Admission: RE | Admit: 2017-05-24 | Payer: Medicaid Other | Source: Ambulatory Visit | Admitting: Vascular Surgery

## 2017-05-24 HISTORY — PX: PERIPHERAL VASCULAR THROMBECTOMY: CATH118306

## 2017-05-24 LAB — CBC
HCT: 38.8 % — ABNORMAL LOW (ref 40.0–52.0)
Hemoglobin: 12.4 g/dL — ABNORMAL LOW (ref 13.0–18.0)
MCH: 28.8 pg (ref 26.0–34.0)
MCHC: 31.8 g/dL — ABNORMAL LOW (ref 32.0–36.0)
MCV: 90.5 fL (ref 80.0–100.0)
PLATELETS: 173 10*3/uL (ref 150–440)
RBC: 4.29 MIL/uL — AB (ref 4.40–5.90)
RDW: 15.5 % — ABNORMAL HIGH (ref 11.5–14.5)
WBC: 91.3 10*3/uL (ref 3.8–10.6)

## 2017-05-24 LAB — SURGICAL PCR SCREEN
MRSA, PCR: NEGATIVE
Staphylococcus aureus: NEGATIVE

## 2017-05-24 LAB — HEPARIN LEVEL (UNFRACTIONATED): HEPARIN UNFRACTIONATED: 0.31 [IU]/mL (ref 0.30–0.70)

## 2017-05-24 LAB — GLUCOSE, CAPILLARY
GLUCOSE-CAPILLARY: 134 mg/dL — AB (ref 65–99)
GLUCOSE-CAPILLARY: 127 mg/dL — AB (ref 65–99)
Glucose-Capillary: 136 mg/dL — ABNORMAL HIGH (ref 65–99)

## 2017-05-24 SURGERY — PERIPHERAL VASCULAR THROMBECTOMY
Anesthesia: Moderate Sedation | Laterality: Right

## 2017-05-24 MED ORDER — FENTANYL CITRATE (PF) 100 MCG/2ML IJ SOLN
INTRAMUSCULAR | Status: AC
  Filled 2017-05-24: qty 2

## 2017-05-24 MED ORDER — MIDAZOLAM HCL 2 MG/2ML IJ SOLN
INTRAMUSCULAR | Status: AC
  Filled 2017-05-24: qty 2

## 2017-05-24 MED ORDER — FENTANYL CITRATE (PF) 100 MCG/2ML IJ SOLN
INTRAMUSCULAR | Status: AC
Start: 2017-05-24 — End: 2017-05-24
  Filled 2017-05-24: qty 2

## 2017-05-24 MED ORDER — CEFAZOLIN SODIUM-DEXTROSE 2-4 GM/100ML-% IV SOLN
2.0000 g | Freq: Once | INTRAVENOUS | Status: AC
  Administered 2017-05-24: 2 g via INTRAVENOUS

## 2017-05-24 MED ORDER — HEPARIN SODIUM (PORCINE) 1000 UNIT/ML IJ SOLN
INTRAMUSCULAR | Status: DC | PRN
  Administered 2017-05-24: 2000 [IU] via INTRAVENOUS
  Administered 2017-05-24: 4000 [IU] via INTRAVENOUS

## 2017-05-24 MED ORDER — FENTANYL CITRATE (PF) 100 MCG/2ML IJ SOLN
INTRAMUSCULAR | Status: DC | PRN
  Administered 2017-05-24: 50 ug via INTRAVENOUS
  Administered 2017-05-24 (×2): 25 ug via INTRAVENOUS
  Administered 2017-05-24: 50 ug via INTRAVENOUS
  Administered 2017-05-24: 25 ug via INTRAVENOUS
  Administered 2017-05-24: 12.5 ug via INTRAVENOUS

## 2017-05-24 MED ORDER — CEFAZOLIN SODIUM-DEXTROSE 2-4 GM/100ML-% IV SOLN
INTRAVENOUS | Status: AC
  Filled 2017-05-24: qty 100

## 2017-05-24 MED ORDER — SODIUM CHLORIDE 0.9 % IV SOLN
INTRAVENOUS | Status: DC | PRN
Start: 2017-05-24 — End: 2017-05-25
  Administered 2017-05-24: 15:00:00 via INTRAVENOUS

## 2017-05-24 MED ORDER — SODIUM CHLORIDE 0.9 % IV SOLN
1.0000 g | Freq: Three times a day (TID) | INTRAVENOUS | Status: DC
  Administered 2017-05-24 – 2017-05-25 (×3): 1 g via INTRAVENOUS
  Filled 2017-05-24 (×5): qty 1

## 2017-05-24 MED ORDER — MIDAZOLAM HCL 5 MG/5ML IJ SOLN
INTRAMUSCULAR | Status: AC
  Filled 2017-05-24: qty 5

## 2017-05-24 MED ORDER — ALTEPLASE 2 MG IJ SOLR
INTRAMUSCULAR | Status: DC | PRN
  Administered 2017-05-24: 10 mg

## 2017-05-24 MED ORDER — MIDAZOLAM HCL 2 MG/2ML IJ SOLN
INTRAMUSCULAR | Status: DC | PRN
  Administered 2017-05-24 (×2): 1 mg via INTRAVENOUS
  Administered 2017-05-24 (×2): 2 mg via INTRAVENOUS
  Administered 2017-05-24 (×2): 1 mg via INTRAVENOUS

## 2017-05-24 MED ORDER — IOPAMIDOL (ISOVUE-300) INJECTION 61%
INTRAVENOUS | Status: DC | PRN
  Administered 2017-05-24: 50 mL via INTRAVENOUS

## 2017-05-24 MED ORDER — HEPARIN SODIUM (PORCINE) 1000 UNIT/ML IJ SOLN
INTRAMUSCULAR | Status: AC
  Filled 2017-05-24: qty 1

## 2017-05-24 SURGICAL SUPPLY — 25 items
BALLN ARMADA 14X80X80 (BALLOONS) ×3
BALLN DORADO 5X200X135 (BALLOONS) ×3
BALLN ULTRVRSE 12X60X75 (BALLOONS) ×3
BALLN ULTRVRSE 8X200X75 (BALLOONS) ×3
BALLOON ARMADA 14X80X80 (BALLOONS) ×1 IMPLANT
BALLOON DORADO 5X200X135 (BALLOONS) ×1 IMPLANT
BALLOON ULTRVRSE 12X60X75 (BALLOONS) ×1 IMPLANT
BALLOON ULTRVRSE 8X200X75 (BALLOONS) ×1 IMPLANT
CANISTER PENUMBRA MAX (MISCELLANEOUS) ×3 IMPLANT
CANNULA 5F STIFF (CANNULA) ×3 IMPLANT
CATH BEACON 5 .035 65 KMP TIP (CATHETERS) ×3 IMPLANT
CATH INDIGO 8 TORQ TIP 85CM (CATHETERS) ×3 IMPLANT
CATH INDIGO SEP 8 (CATHETERS) ×3 IMPLANT
CATH INFUS 135CMX50CM (CATHETERS) ×3 IMPLANT
CATH INFUS 90CMX50CM (CATHETERS) ×2
CATH INFUS UNIFUSE 90X50 5FR (CATHETERS) ×1 IMPLANT
DEVICE PRESTO INFLATION (MISCELLANEOUS) ×3 IMPLANT
GLIDEWIRE ADV .035X180CM (WIRE) ×3 IMPLANT
PACK ANGIOGRAPHY (CUSTOM PROCEDURE TRAY) ×3 IMPLANT
SHEATH BRITE TIP 8FRX11 (SHEATH) ×3 IMPLANT
STENT LIFESTAR 14X80X80 (Permanent Stent) ×3 IMPLANT
SYR MEDRAD MARK V 150ML (SYRINGE) ×3 IMPLANT
TUBING ASPIRATION INDIGO (MISCELLANEOUS) ×3 IMPLANT
WIRE J 3MM .035X145CM (WIRE) ×3 IMPLANT
WIRE MAGIC TORQUE 260C (WIRE) ×3 IMPLANT

## 2017-05-24 NOTE — H&P (Signed)
[facility] History & Physical Update  The patient was interviewed and re-examined.  The patient's previous History and Physical has been reviewed and is unchanged.  There is no change in the plan of care. We plan to proceed with the scheduled procedure.  [name], MD  05/24/2017, 10:38 AM

## 2017-05-24 NOTE — Op Note (Signed)
[facility]   OPERATIVE NOTE   PRE-OPERATIVE DIAGNOSIS: extensive recurrent RLE DVT  POST-OPERATIVE DIAGNOSIS: same   PROCEDURE: 1. US guidance for vascular access to right small saphenous vein 2. Second order catheter placement right leg into the iliac veins and IVC from right lesser saphenous vein 3. IVC gram and RLE venogram 4.  Catheter directed thrombolysis with a lytic catheter to the right popliteal vein, superficial femoral vein, common femoral vein, and iliac veins with 10 mg of TPA  5. Mechanical thrombectomy to the right popliteal vein, superficial femoral vein, common femoral vein, and iliac veins with the penumbra cat 8 device 6. PTA of the SFV and common femoral vein with 5 mm diameter and 8 mm diameter balloons  7. PTA of right iliac veins both common and external with 12 mm balloon 8.   Self expanding stent placement to the right external iliac vein with 14 mm diameter by 8 cm length stent    SURGEON: [name], MD  ASSISTANT(S): none  ANESTHESIA: local with moderate conscious sedation for 90 minutes using 8 mg of Versed and 187.5 Mcg of Fentanyl  ESTIMATED BLOOD LOSS: 700 cc  FINDING(S): 1. Extensive right lower extremity DVT  SPECIMEN(S): none  INDICATIONS:  Patient is a 62 y.o. male who presents with recurrent right lower extremity and iliac vein DVT. Patient has marked leg swelling and pain. Venous intervention is performed to reduce the symtpoms and avoid long term postphlebitic symptoms.   DESCRIPTION: After obtaining full informed written consent, the patient was brought back to the vascular suite and placed supine upon the table.Moderate conscious sedation was administered during a face to face encounter with the patient throughout the procedure with my supervision of the RN administering medicines and monitoring the patient's vital signs, pulse oximetry, telemetry and mental status throughout from the start of  the procedure until the patient was taken to the recovery room. After obtaining adequate anesthesia, the patient was prepped and draped in the standard fashion. An IVC filter was already in place. The patient was then placed into the prone position. The right lesser saphenous vein was then accessed under direct ultrasound guidance without difficulty with a micropuncture needle and a permanent image was recorded. I then upsized to an 8Fr sheath over a J wire. 4000 units of heparin were then given. Later, an additional 2000 units of intravenous heparin were given imaging showed extensive DVT with minimal flow. A Kumpe catheter and Magic tourque wire were then advanced into the CFV and images were performed. Complete thrombosis and occlusion of the external and common iliac veins was seen. I was able to cross the thrombus and stenosis and advance into the IVC which was patent with a filter in place.  There was very significant narrowing particularly throughout the superficial femoral veins and I was worried that a lytic catheter would not initially pass.  I took a 5 mm diameter by 30 cm length angioplasty balloon and treated from the right popliteal vein up to the right common femoral vein.  This was inflated to 12 atm for 1 minute. I then used the 130 cm total length 50 cm working length lytic catheter and instilled 10 mg of tpa throughout the right popliteal, superficial femoral, common femoral, external and common iliac veins.  After this dwelled for 15-20 minutes, I used the Penumbra Cat 8 catheter and evacuated about 250 of effluent with mechanical thrombectomy throughout the iliac veins, CFV, SFV, and popliteal vein. This had  mild improvement. I then treated the popliteal vein, SFV, and CFV with 8 mm diameter by 20 cm length angioplasty balloon to open a channel. 2 inflations were required to get from the distal superficial femoral vein up to the common femoral vein.  This resulted in some resolution  of the thrombus and improved flow but the iliacs remained thrombosed. I then turned my attention to the iliac veins. The stenosis/occlusion and thrombus was treated with a 12 mm diameter angioplasty balloon.  3 inflations with a 6 cm long balloon were required in each inflation was 6-8 atm for 1 minute.  Residual thrombus remained.  Another pass with the penumbra cat 8 catheter evacuated another 200 cc of effluent.  At this point, the superficial femoral vein and common femoral veins were markedly improved but the iliac vein system remained completely occluded.  There was still some thrombus particularly in the common femoral and proximal superficial femoral vein, but there was now a reasonable flow channel.  Another angioplasty was performed without significant improvement and I ran the penumbra cat a catheter again and large chunks of thrombus were removed from the iliac venous system.  Now, at the distal edge of the previously placed stent remained a high-grade residual stenosis with thrombus.  I felt that placing a stent in this location would be our best bet.  A 14 mm diameter by 8 cm length stent was placed in the right external iliac vein down to just above the femoral head on bridging to the previously placed stent.  This was postdilated with a 14 mm balloon in the previous common iliac stent was also treated with a 14 mm balloon.  Completion venogram at this point showed only about a 30% residual stenosis in the iliac vein system with mild residual thrombus.  The flow was markedly improved.  I then elected to terminate the procedure. The sheath was removed and a dressing was placed. She was taken to the recovery room in stable condition having tolerated the procedure well.   COMPLICATIONS: None  CONDITION: Stable  [name] 05/24/2017 1:09 PM

## 2017-05-24 NOTE — Progress Notes (Signed)
ANTICOAGULATION CONSULT NOTE   Pharmacy Consult for Heparin  Indication: chest pain/ACS  No Known Allergies  Patient Measurements: Height: 6\' 3"  (190.5 cm) Weight: 256 lb (116.1 kg) IBW/kg (Calculated) : 84.5  Heparin Dosing Weight: 108.8 kg   Vital Signs: Temp: 98.9 F (37.2 C) (02/11 0448) Temp Source: Oral (02/11 0448) BP: 106/59 (02/11 0448) Pulse Rate: 77 (02/11 0448)  Labs: Recent Labs    05/22/17 0450  05/23/17 0233 05/23/17 0947 05/23/17 1613 05/24/17 0428  HGB 13.8  --  13.3  --   --  12.4*  HCT 43.0  --  41.7  --   --  38.8*  PLT 128*  --  136*  --   --  173  HEPARINUNFRC 0.20*   < > 0.29* 0.32 0.35 0.31  CREATININE  --   --   --   --  1.22  --    < > = values in this interval not displayed.    Estimated Creatinine Clearance: 89.5 mL/min (by C-G formula based on SCr of 1.22 mg/dL).   Medical History: Past Medical History:  Diagnosis Date  . Cancer ([place])    CLL monitored   . Clotting disorder ([place])   . Diabetes mellitus without complication ([place])    Type II    Medications:  Medications Prior to Admission  Medication Sig Dispense Refill Last Dose  . metFORMIN (GLUCOPHAGE-XR) 750 MG 24 hr tablet Take 750 mg by mouth daily.   05/18/2017 at Unknown time  . methocarbamol (ROBAXIN) 500 MG tablet Take 1 tablet (500 mg total) by mouth every 6 (six) hours as needed for muscle spasms. (Patient not taking: Reported on 05/19/2017) 12 tablet 0 Not Taking at Unknown time  . predniSONE (DELTASONE) 10 MG tablet Take 6 tablets  today, on day 2 take 5 tablets, day 3 take 4 tablets, day 4 take 3 tablets, day 5 take  2 tablets and 1 tablet the last day (Patient not taking: Reported on 05/19/2017) 21 tablet 0 Not Taking at Unknown time  . warfarin (COUMADIN) 7.5 MG tablet Take 1 tablet (7.5 mg total) by mouth daily at 6 PM. (Patient not taking: Reported on 05/19/2017) 20 tablet 0 Not Taking at Unknown time    Assessment: Pharmacy consulted to dose heparin in this 62 year old  male admitted with NSTEMI.  PTA med list says pt was on started on warfarin in 7/18.  Pt states he hasnt taken warfarin in 4 months.    Goal of Therapy:  Heparin level 0.3-0.7 units/ml Monitor platelets by anticoagulation protocol: Yes   Plan:  2/8: Heparin level in am therapeutic. Will continue current heparin gtt rate.   2/9 AM heparin level 0.20. 1600 unit bolus and increase rate to 2000 units/hr. Recheck in 6 hours.  2/9 HL @ 1227= 0.30. Barely therapeutic. Will slightly increase drip to 2050 units/hr. Will check confirmatory HL in 6 hrs.  2/9 HL @ 1921 =0.20 Level in subtherapeutic. Nurse reports there has been no interruption in heparin therapy. Will bolus 1500 units and increase infusion to 2250 units/hr and recheck HL in 6 hours.   2/10 0230 heparin level 0.29. 1600 unit bolus and increase rate to 2450 units/hr. Recheck in 6 hours.  2/10 HL@ 0947= 0.32.  Will continue current drip rate and check a confirmatory level in 6 hrs.   2/10 HL @ 1730 = 0.35. Will continue current drip rate and check a HL tomorrow.    2/11 HL @ 0700 = 0.31.  Will continue current drip rate and check a HL tomorrow.    Thomasenia Sales, PharmD, MBA, [facility]   05/24/2017 8:16 AM

## 2017-05-24 NOTE — Progress Notes (Signed)
ANTICOAGULATION CONSULT NOTE   Pharmacy Consult for Heparin  Indication: chest pain/ACS  No Known Allergies  Patient Measurements: Height: 6\' 3"  (190.5 cm) Weight: 256 lb (116.1 kg) IBW/kg (Calculated) : 84.5 Heparin Dosing Weight: 108.8 kg   Vital Signs: Temp: 98.9 F (37.2 C) (02/11 0448) Temp Source: Oral (02/11 0448) BP: 106/59 (02/11 0448) Pulse Rate: 77 (02/11 0448)  Labs: Recent Labs    05/22/17 0450  05/23/17 0233 05/23/17 0947 05/23/17 1613 05/24/17 0428  HGB 13.8  --  13.3  --   --  12.4*  HCT 43.0  --  41.7  --   --  38.8*  PLT 128*  --  136*  --   --  173  HEPARINUNFRC 0.20*   < > 0.29* 0.32 0.35 0.31  CREATININE  --   --   --   --  1.22  --    < > = values in this interval not displayed.    Estimated Creatinine Clearance: 89.5 mL/min (by C-G formula based on SCr of 1.22 mg/dL).   Medical History: Past Medical History:  Diagnosis Date  . Cancer ([place])    CLL monitored   . Clotting disorder ([place])   . Diabetes mellitus without complication ([place])    Type II    Medications:  Medications Prior to Admission  Medication Sig Dispense Refill Last Dose  . metFORMIN (GLUCOPHAGE-XR) 750 MG 24 hr tablet Take 750 mg by mouth daily.   05/18/2017 at Unknown time  . methocarbamol (ROBAXIN) 500 MG tablet Take 1 tablet (500 mg total) by mouth every 6 (six) hours as needed for muscle spasms. (Patient not taking: Reported on 05/19/2017) 12 tablet 0 Not Taking at Unknown time  . predniSONE (DELTASONE) 10 MG tablet Take 6 tablets  today, on day 2 take 5 tablets, day 3 take 4 tablets, day 4 take 3 tablets, day 5 take  2 tablets and 1 tablet the last day (Patient not taking: Reported on 05/19/2017) 21 tablet 0 Not Taking at Unknown time  . warfarin (COUMADIN) 7.5 MG tablet Take 1 tablet (7.5 mg total) by mouth daily at 6 PM. (Patient not taking: Reported on 05/19/2017) 20 tablet 0 Not Taking at Unknown time    Assessment: Pharmacy consulted to dose heparin in this 62 year old  male admitted with NSTEMI.  PTA med list says pt was on started on warfarin in 7/18.  Pt states he hasnt taken warfarin in 4 months.    Goal of Therapy:  Heparin level 0.3-0.7 units/ml Monitor platelets by anticoagulation protocol: Yes   Plan:  2/8: Heparin level in am therapeutic. Will continue current heparin gtt rate.   2/9 AM heparin level 0.20. 1600 unit bolus and increase rate to 2000 units/hr. Recheck in 6 hours.  2/9 HL @ 1227= 0.30. Barely therapeutic. Will slightly increase drip to 2050 units/hr. Will check confirmatory HL in 6 hrs.  2/9 HL @ 1921 =0.20 Level in subtherapeutic. Nurse reports there has been no interruption in heparin therapy. Will bolus 1500 units and increase infusion to 2250 units/hr and recheck HL in 6 hours.   2/10 0230 heparin level 0.29. 1600 unit bolus and increase rate to 2450 units/hr. Recheck in 6 hours.  2/10 HL@ 0947= 0.32.  Will continue current drip rate and check a confirmatory level in 6 hrs.   2/10 HL @ 1730 = 0.35. Will continue current drip rate and check a HL tomorrow.   2/11 AM heparin level 0.31. Continue current regimen.  Recheck heparin level and CBC with tomorrow AM labs.  Sim Boast, PharmD, BCPS  05/24/17 5:39 AM

## 2017-05-24 NOTE — Progress Notes (Signed)
[place] [facility] [place] NAME: Anthony Hart    MR#:  [id number]  DATE OF BIRTH:  [birth year]  SUBJECTIVE:  CHIEF COMPLAINT:   Chief Complaint  Patient presents with  . Leg Pain  Status post thrombectomy, recovering well, patient without complaint  REVIEW OF SYSTEMS:  CONSTITUTIONAL: No fever, fatigue or weakness.  EYES: No blurred or double vision.  EARS, NOSE, AND THROAT: No tinnitus or ear pain.  RESPIRATORY: No cough, shortness of breath, wheezing or hemoptysis.  CARDIOVASCULAR: No chest pain, orthopnea, edema.  GASTROINTESTINAL: No nausea, vomiting, diarrhea or abdominal pain.  GENITOURINARY: No dysuria, hematuria.  ENDOCRINE: No polyuria, nocturia,  HEMATOLOGY: No anemia, easy bruising or bleeding SKIN: No rash or lesion. MUSCULOSKELETAL: No joint pain or arthritis.   NEUROLOGIC: No tingling, numbness, weakness.  PSYCHIATRY: No anxiety or depression.   ROS  DRUG ALLERGIES:  No Known Allergies  VITALS:  Blood pressure 118/73, pulse 86, temperature 97.8 F (36.6 C), temperature source Oral, resp. rate 16, height 6\' 3"  (1.905 m), weight 115.7 kg (255 lb), SpO2 96 %.  PHYSICAL EXAMINATION:  GENERAL:  62 y.o.-year-old patient lying in the bed with no acute distress.  EYES: Pupils equal, round, reactive to light and accommodation. No scleral icterus. Extraocular muscles intact.  HEENT: Head atraumatic, normocephalic. Oropharynx and nasopharynx clear.  NECK:  Supple, no jugular venous distention. No thyroid enlargement, no tenderness.  LUNGS: Normal breath sounds bilaterally, no wheezing, rales,rhonchi or crepitation. No use of accessory muscles of respiration.  CARDIOVASCULAR: S1, S2 normal. No murmurs, rubs, or gallops.  ABDOMEN: Soft, nontender, nondistended. Bowel sounds present. No organomegaly or mass.  EXTREMITIES: No pedal edema, cyanosis, or clubbing.  NEUROLOGIC: Cranial nerves II through XII are intact. Muscle strength 5/5 in  all extremities. Sensation intact. Gait not checked.  PSYCHIATRIC: The patient is alert and oriented x 3.  SKIN: No obvious rash, lesion, or ulcer.   Physical Exam LABORATORY PANEL:   CBC Recent Labs  Lab 05/24/17 0428  WBC 91.3*  HGB 12.4*  HCT 38.8*  PLT 173   ------------------------------------------------------------------------------------------------------------------  Chemistries  Recent Labs  Lab 05/19/17 2154 05/20/17 0449 05/23/17 1613  NA 139 139  --   K 5.0 4.7  --   CL 103 102  --   CO2 26 28  --   GLUCOSE 135* 232*  --   BUN 18 19  --   CREATININE 0.98 1.09 1.22  CALCIUM 8.7* 8.6*  --   AST 19  --   --   ALT 16*  --   --   ALKPHOS 53  --   --   BILITOT 1.0  --   --    ------------------------------------------------------------------------------------------------------------------  Cardiac Enzymes Recent Labs  Lab 05/19/17 2154  TROPONINI <0.03   ------------------------------------------------------------------------------------------------------------------  RADIOLOGY:  Dg Chest 2 View  Result Date: 05/23/2017 CLINICAL DATA:  Fever EXAM: CHEST  2 VIEW COMPARISON:  Chest CT 10/29/2016 FINDINGS: Patchy opacity in the right lower lung could reflect atelectasis or pneumonia. Left lung clear. Heart is normal size. No effusions or acute bony abnormality. IMPRESSION: Right lower lung airspace opacity, atelectasis versus pneumonia. Electronically Signed   By: Rolm Baptise M.D.   On: 05/23/2017 09:42    ASSESSMENT AND PLAN:  1 acute HCAP Resolving Acquired in the hospital on May 23, 2017 Chest x-ray noted for pneumonia Continue pneumonia protocol, vancomycin/cefepime for 5-7-day course, follow-up on cultures  2 acute recurrent right DVT  Status post  thrombectomy with stent placement on May 24, 2017, recovering well  Continue heparin drip  2CLL Stable We will need to follow-up with Dr.Finneganstatus post discharge  3chronic  diabetes mellitus type 2  Controlled on current regiment Hold metformin for now  All the records are reviewed and case discussed with Care Management/Social Workerr. Management plans discussed with the patient, family and they are in agreement.  CODE STATUS: full  TOTAL TIME TAKING CARE OF THIS PATIENT: 35 minutes.     POSSIBLE D/C IN 1-2 DAYS, DEPENDING ON CLINICAL CONDITION.   Avel Peace Yossi Hinchman M.D on 05/24/2017   Between 7am to 6pm - Pager - [phone]  After 6pm go to [email or web address] - password [email or web address] [facility]  Office  [phone]  CC: Primary care physician; [name], MD  Note: This dictation was prepared with Dragon dictation along with smaller phrase technology. Any transcriptional errors that result from this process are unintentional.

## 2017-05-25 ENCOUNTER — Encounter: Payer: Self-pay | Admitting: Vascular Surgery

## 2017-05-25 LAB — CBC
HEMATOCRIT: 38.6 % — AB (ref 40.0–52.0)
HEMOGLOBIN: 12.2 g/dL — AB (ref 13.0–18.0)
MCH: 28.6 pg (ref 26.0–34.0)
MCHC: 31.5 g/dL — ABNORMAL LOW (ref 32.0–36.0)
MCV: 90.7 fL (ref 80.0–100.0)
Platelets: 155 10*3/uL (ref 150–440)
RBC: 4.26 MIL/uL — AB (ref 4.40–5.90)
RDW: 15.3 % — ABNORMAL HIGH (ref 11.5–14.5)
WBC: 87.9 10*3/uL — AB (ref 3.8–10.6)

## 2017-05-25 LAB — HIV ANTIBODY (ROUTINE TESTING W REFLEX): HIV SCREEN 4TH GENERATION: NONREACTIVE

## 2017-05-25 LAB — HEPARIN LEVEL (UNFRACTIONATED): Heparin Unfractionated: 0.26 IU/mL — ABNORMAL LOW (ref 0.30–0.70)

## 2017-05-25 LAB — GLUCOSE, CAPILLARY
GLUCOSE-CAPILLARY: 164 mg/dL — AB (ref 65–99)
Glucose-Capillary: 183 mg/dL — ABNORMAL HIGH (ref 65–99)

## 2017-05-25 MED ORDER — MOXIFLOXACIN HCL 400 MG PO TABS: 400.0000 mg | ORAL_TABLET | Freq: Every day | ORAL | 0 refills | Status: AC

## 2017-05-25 MED ORDER — HEPARIN BOLUS VIA INFUSION
1500.0000 [IU] | Freq: Once | INTRAVENOUS | Status: AC
  Administered 2017-05-25: 1500 [IU] via INTRAVENOUS
  Filled 2017-05-25: qty 1500

## 2017-05-25 MED ORDER — APIXABAN 5 MG PO TABS
5.0000 mg | ORAL_TABLET | Freq: Once | ORAL | Status: AC
  Administered 2017-05-25: 5 mg via ORAL
  Filled 2017-05-25: qty 1

## 2017-05-25 MED ORDER — APIXABAN 5 MG PO TABS: 5.0000 mg | ORAL_TABLET | Freq: Two times a day (BID) | ORAL | 0 refills | Status: DC

## 2017-05-25 NOTE — Discharge Summary (Addendum)
[facility] [place] NAME: Anthony Hart    MR#:  [id number]  DATE OF BIRTH:  11/29/1955  DATE OF ADMISSION:  05/19/2017 ADMITTING PHYSICIAN: Lance Coon, MD  DATE OF DISCHARGE: No discharge date for patient encounter.  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 Cancer ([place])    CLL monitored   . Clotting disorder ([place])   . Diabetes mellitus without complication ([place])    Type II    HOSPITAL COURSE:  1acute HCAP Resolving Acquired in the hospital on May 23, 2017 Chest x-ray noted for pneumonia Treated on our pneumonia protocol, provided vancomycin/cefepime while in house, and patient did well  2acute recurrent right DVT Much improved S/p thrombectomy with stent placement on May 24, 2017 by vascular surgery/Dr. do, recovering well  Treated in house with heparin drip, in discussion with vascular surgery/Dr. do-patient okay to be discharged on Eliquis with follow-up, patient most likely will have recurrent episodes in the future given his higher for recurrence Status post IVC filter placement  2CLL Stable We will need to follow-up with Dr.Finneganstatus post discharge  3chronic diabetes mellitus type 2  Controlled on current regiment Hold metformin for now  DISCHARGE CONDITIONS:  On day of discharge patient is afebrile, hemodynamically stable, tolerating diet, ready for discharge home with appropriate follow-up with primary care provider 3-5 days for reevaluation, follow-up with vascular surgery/Dr. do in 2-4 weeks for evaluation right lower  extremity DVT, for more specific details please see chart  CONSULTS OBTAINED:    DRUG ALLERGIES:  No Known Allergies  DISCHARGE MEDICATIONS:   Allergies as of 05/25/2017   No Known Allergies     Medication List    STOP taking these medications   predniSONE 10 MG tablet Commonly known as:  DELTASONE   warfarin 7.5 MG tablet Commonly known as:  COUMADIN     TAKE these medications   apixaban 5 MG Tabs tablet Commonly known as:  ELIQUIS Take 1 tablet (5 mg total) by mouth 2 (two) times daily.   metFORMIN 750 MG 24 hr tablet Commonly known as:  GLUCOPHAGE-XR Take 750 mg by mouth daily.   methocarbamol 500 MG tablet Commonly known as:  ROBAXIN Take 1 tablet (500 mg total) by mouth every 6 (six) hours as needed for muscle spasms.   moxifloxacin 400 MG tablet Commonly known as:  AVELOX Take 1 tablet (400 mg total) by mouth daily for 7 days.        DISCHARGE INSTRUCTIONS:   If you experience worsening of your admission symptoms, develop shortness of breath, life threatening emergency, suicidal or homicidal thoughts you must seek medical attention immediately by calling 911 or calling your MD immediately  if symptoms less severe.  You Must read complete instructions/literature along with all the possible adverse reactions/side effects for all the Medicines you take and that have been prescribed to you. Take any new Medicines after you have completely understood and accept all the possible adverse reactions/side effects.   Please note  You were cared for by a hospitalist during your hospital stay. If you have any questions about your discharge medications  or the care you received while you were in the hospital after you are discharged, you can call the unit and asked to speak with the hospitalist on call if the hospitalist that took care of you is not available. Once you are discharged, your primary care physician will handle any further medical issues. Please note that NO  REFILLS for any discharge medications will be authorized once you are discharged, as it is imperative that you return to your primary care physician (or establish a relationship with a primary care physician if you do not have one) for your aftercare needs so that they can reassess your need for medications and monitor your lab values.    Today   CHIEF COMPLAINT:   Chief Complaint  Patient presents with  . Leg Pain    HISTORY OF PRESENT ILLNESS:  62 y.o. male who presents with swelling of his right lower extremity.  Patient has a history of recurring DVTs.  Ultrasound tonight shows DVT in that leg.  Given the extensive nature of his DVT, heparin drip started and hospitalist called for admission  VITAL SIGNS:  Blood pressure 113/63, pulse 82, temperature 98.5 F (36.9 C), temperature source Oral, resp. rate 16, height 6\' 3"  (1.905 m), weight 115.7 kg (255 lb), SpO2 95 %.  I/O:    Intake/Output Summary (Last 24 hours) at 05/25/2017 1118 Last data filed at 05/25/2017 1035 Gross per 24 hour  Intake 2016 ml  Output 2025 ml  Net -9 ml    PHYSICAL EXAMINATION:  GENERAL:  62 y.o.-year-old patient lying in the bed with no acute distress.  EYES: Pupils equal, round, reactive to light and accommodation. No scleral icterus. Extraocular muscles intact.  HEENT: Head atraumatic, normocephalic. Oropharynx and nasopharynx clear.  NECK:  Supple, no jugular venous distention. No thyroid enlargement, no tenderness.  LUNGS: Normal breath sounds bilaterally, no wheezing, rales,rhonchi or crepitation. No use of accessory muscles of respiration.  CARDIOVASCULAR: S1, S2 normal. No murmurs, rubs, or gallops.  ABDOMEN: Soft, non-tender, non-distended. Bowel sounds present. No organomegaly or mass.  EXTREMITIES: No pedal edema, cyanosis, or clubbing.  NEUROLOGIC: Cranial nerves II through XII are intact. Muscle strength 5/5 in all extremities. Sensation intact. Gait not checked.  PSYCHIATRIC: The patient  is alert and oriented x 3.  SKIN: No obvious rash, lesion, or ulcer.   DATA REVIEW:   CBC Recent Labs  Lab 05/25/17 0510  WBC 87.9*  HGB 12.2*  HCT 38.6*  PLT 155    Chemistries  Recent Labs  Lab 05/19/17 2154 05/20/17 0449 05/23/17 1613  NA 139 139  --   K 5.0 4.7  --   CL 103 102  --   CO2 26 28  --   GLUCOSE 135* 232*  --   BUN 18 19  --   CREATININE 0.98 1.09 1.22  CALCIUM 8.7* 8.6*  --   AST 19  --   --   ALT 16*  --   --   ALKPHOS 53  --   --   BILITOT 1.0  --   --     Cardiac Enzymes Recent Labs  Lab 05/19/17 2154  TROPONINI <0.03    Microbiology Results  Results for orders placed or performed during the hospital encounter of 05/19/17  CULTURE, BLOOD (ROUTINE X 2) w Reflex to ID Panel     Status: None (Preliminary result)   Collection Time: 05/23/17  9:47 AM  Result Value Ref Range Status   Specimen Description BLOOD  LT [facility]  Final   Special Requests   Final    BOTTLES DRAWN AEROBIC AND ANAEROBIC Blood Culture adequate volume   Culture   Final    NO GROWTH 2 DAYS Performed at [facility], [address] [place], [place] [address]    Report Status PENDING  Incomplete  CULTURE, BLOOD (ROUTINE X 2) w Reflex to ID Panel     Status: None (Preliminary result)   Collection Time: 05/23/17 10:05 AM  Result Value Ref Range Status   Specimen Description BLOOD LT HAND  Final   Special Requests   Final    BOTTLES DRAWN AEROBIC AND ANAEROBIC Blood Culture adequate volume   Culture   Final    NO GROWTH 2 DAYS Performed at [facility], [address] [place], [place] [address]    Report Status PENDING  Incomplete  Surgical pcr screen     Status: None   Collection Time: 05/24/17  6:42 AM  Result Value Ref Range Status   MRSA, PCR NEGATIVE NEGATIVE Final   Staphylococcus aureus NEGATIVE NEGATIVE Final    Comment: (NOTE) The Xpert SA Assay (FDA approved for NASAL specimens in patients 41 years of age and older), is one component of a  comprehensive surveillance program. It is not intended to diagnose infection nor to guide or monitor treatment. Performed at [facility], [address], [place], [place] [address]     RADIOLOGY:  No results found.  EKG:   Orders placed or performed during the hospital encounter of 05/19/17  . ED EKG  . ED EKG      Management plans discussed with the patient, family and they are in agreement.  CODE STATUS:     Code Status Orders  (From admission, onward)        Start     Ordered   05/20/17 0048  Full code  Continuous     05/20/17 0047    Code Status History    Date Active Date Inactive Code Status Order ID Comments User Context   10/29/2016 02:11 11/04/2016 19:43 Full Code [id number]  Harvie Bridge, DO ED   06/30/2016 23:35 07/03/2016 19:43 Full Code [id number]  [name], MD Inpatient      TOTAL TIME TAKING CARE OF THIS PATIENT: 45 minutes.    Avel Peace Salary M.D on 05/25/2017 at 11:18 AM  Between 7am to 6pm - Pager - [phone]  After 6pm go to [email or web address] - password [email or web address] [facility]  Office  [phone]  CC: Primary care physician; [name], MD   Note: This dictation was prepared with Dragon dictation along with smaller phrase technology. Any transcriptional errors that result from this process are unintentional.

## 2017-05-25 NOTE — Progress Notes (Signed)
ANTICOAGULATION CONSULT NOTE   Pharmacy Consult for Heparin  Indication: chest pain/ACS  No Known Allergies  Patient Measurements: Height: 6\' 3"  (190.5 cm) Weight: 255 lb (115.7 kg) IBW/kg (Calculated) : 84.5  Heparin Dosing Weight: 108.8 kg   Vital Signs: Temp: 98.5 F (36.9 C) (02/12 0542) Temp Source: Oral (02/12 0542) BP: 113/63 (02/12 0542) Pulse Rate: 82 (02/12 0542)  Labs: Recent Labs    05/23/17 0233  05/23/17 1613 05/24/17 0428 05/25/17 0510  HGB 13.3  --   --  12.4* 12.2*  HCT 41.7  --   --  38.8* 38.6*  PLT 136*  --   --  173 155  HEPARINUNFRC 0.29*   < > 0.35 0.31 0.26*  CREATININE  --   --  1.22  --   --    < > = values in this interval not displayed.    Estimated Creatinine Clearance: 89.4 mL/min (by C-G formula based on SCr of 1.22 mg/dL).   Medical History: Past Medical History:  Diagnosis Date  . Cancer ([place])    CLL monitored   . Clotting disorder ([place])   . Diabetes mellitus without complication ([place])    Type II    Medications:  Medications Prior to Admission  Medication Sig Dispense Refill Last Dose  . metFORMIN (GLUCOPHAGE-XR) 750 MG 24 hr tablet Take 750 mg by mouth daily.   05/18/2017 at Unknown time  . methocarbamol (ROBAXIN) 500 MG tablet Take 1 tablet (500 mg total) by mouth every 6 (six) hours as needed for muscle spasms. (Patient not taking: Reported on 05/19/2017) 12 tablet 0 Not Taking at Unknown time  . predniSONE (DELTASONE) 10 MG tablet Take 6 tablets  today, on day 2 take 5 tablets, day 3 take 4 tablets, day 4 take 3 tablets, day 5 take  2 tablets and 1 tablet the last day (Patient not taking: Reported on 05/19/2017) 21 tablet 0 Not Taking at Unknown time  . warfarin (COUMADIN) 7.5 MG tablet Take 1 tablet (7.5 mg total) by mouth daily at 6 PM. (Patient not taking: Reported on 05/19/2017) 20 tablet 0 Not Taking at Unknown time    Assessment: Pharmacy consulted to dose heparin in this 62 year old male admitted with NSTEMI.  PTA med list  says pt was on started on warfarin in 7/18.  Pt states he hasnt taken warfarin in 4 months.    Goal of Therapy:  Heparin level 0.3-0.7 units/ml Monitor platelets by anticoagulation protocol: Yes   Plan:  2/8: Heparin level in am therapeutic. Will continue current heparin gtt rate.   2/9 AM heparin level 0.20. 1600 unit bolus and increase rate to 2000 units/hr. Recheck in 6 hours.  2/9 HL @ 1227= 0.30. Barely therapeutic. Will slightly increase drip to 2050 units/hr. Will check confirmatory HL in 6 hrs.  2/9 HL @ 1921 =0.20 Level in subtherapeutic. Nurse reports there has been no interruption in heparin therapy. Will bolus 1500 units and increase infusion to 2250 units/hr and recheck HL in 6 hours.   2/10 0230 heparin level 0.29. 1600 unit bolus and increase rate to 2450 units/hr. Recheck in 6 hours.  2/10 HL@ 0947= 0.32.  Will continue current drip rate and check a confirmatory level in 6 hrs.   2/10 HL @ 1730 = 0.35. Will continue current drip rate and check a HL tomorrow.    2/11 HL @ 0700 = 0.31. Will continue current drip rate and check a HL tomorrow.    02/12 @  0500 HL 0.26 subtherapeutic. Will rebolus w/ heparin 1500 units IV x 1 and increase rate to 2600 units/hr and will recheck HL @ 1200, CBC stable.  Tobie Lords, PharmD, BCPS Clinical Pharmacist 05/25/2017

## 2017-05-25 NOTE — Progress Notes (Signed)
Notified Dr. Jerelyn Charles of patient's low grade fever (100) prior to discharge.  Also corrected hospital demographics on patient's work note per Dr. Rolly Pancake request.  Heparin infusion stopped and dose of Eliquis given.  Discharge instructions reviewed with patient and wife.  Understanding was verbalized and all questions were answered.  Patient discharged home via wheelchair in stable condition escorted by volunteer staff.

## 2017-05-28 LAB — CULTURE, BLOOD (ROUTINE X 2)
CULTURE: NO GROWTH
Culture: NO GROWTH
Special Requests: ADEQUATE
Special Requests: ADEQUATE

## 2017-06-06 NOTE — Progress Notes (Deleted)
[facility]  [phone] [phone]  ID: Anthony A Laprise Jr. OB: 03/06/1956  MR#: [id number]  CSN#:[id number]  Patient Care Team: Olmedo, [name], MD as PCP - General (Family Medicine)  CHIEF COMPLAINT: CLL, Rai stage 0, right femoral DVT.  INTERVAL HISTORY: Patient returns to clinic today for repeat laboratory work and further evaluation. He continues to feel well and is asymptomatic. He has no neurologic complaints. He denies any fevers or night sweats. He has a good appetite and denies weight loss. He denies any chest pain or shortness of breath. He denies any nausea, vomiting, constipation, or diarrhea. He has no melena or hematochezia. He has no urinary complaints. Patient offers no specific complaints today.  REVIEW OF SYSTEMS:   Review of Systems  Constitutional: Negative.  Negative for fever, malaise/fatigue and weight loss.  Respiratory: Negative.  Negative for cough and shortness of breath.   Cardiovascular: Negative.  Negative for chest pain and leg swelling.  Gastrointestinal: Negative.  Negative for abdominal pain.  Genitourinary: Negative.   Musculoskeletal: Negative.   Skin: Negative.  Negative for rash.  Neurological: Negative.  Negative for sensory change and weakness.  Psychiatric/Behavioral: Negative.  The patient is not nervous/anxious.     As per HPI. Otherwise, a complete review of systems is negative.  PAST MEDICAL HISTORY: Past Medical History:  Diagnosis Date  . Cancer (HCC)    CLL monitored   . Clotting disorder (HCC)   . Diabetes mellitus without complication (HCC)    Type II    PAST SURGICAL HISTORY: Past Surgical History:  Procedure Laterality Date  . APPENDECTOMY    . COLONOSCOPY WITH PROPOFOL  02/04/2015   Procedure: COLONOSCOPY WITH PROPOFOL;  Surgeon: Paul Y Oh, MD;  Location: [facility] ENDOSCOPY;  Service: Gastroenterology;;  . ESOPHAGOGASTRODUODENOSCOPY (EGD) WITH PROPOFOL N/A 02/04/2015   Procedure:  ESOPHAGOGASTRODUODENOSCOPY (EGD) WITH PROPOFOL;  Surgeon: Paul Y Oh, MD;  Location: [facility] ENDOSCOPY;  Service: Gastroenterology;  Laterality: N/A;  . PERIPHERAL VASCULAR THROMBECTOMY Right 07/02/2016   Procedure: Peripheral Vascular Thrombectomy and IVC filter;  Surgeon: Jason S Dew, MD;  Location: [facility] INVASIVE CV LAB;  Service: Cardiovascular;  Laterality: Right;  . PERIPHERAL VASCULAR THROMBECTOMY Bilateral 10/30/2016   Procedure: Peripheral Vascular Thrombectomy;  Surgeon: Dew, Jason S, MD;  Location: [facility] INVASIVE CV LAB;  Service: Cardiovascular;  Laterality: Bilateral;  . PERIPHERAL VASCULAR THROMBECTOMY Right 05/24/2017   Procedure: PERIPHERAL VASCULAR THROMBECTOMY;  Surgeon: Dew, Jason S, MD;  Location: [facility] INVASIVE CV LAB;  Service: Cardiovascular;  Laterality: Right;    FAMILY HISTORY Family History  Problem Relation Age of Onset  . Diabetes Mother   . Cancer Brother   . Cancer Maternal Aunt   . Stroke Maternal Uncle        ADVANCED DIRECTIVES:    HEALTH MAINTENANCE: Social History   Tobacco Use  . Smoking status: Never Smoker  . Smokeless tobacco: Never Used  Substance Use Topics  . Alcohol use: No  . Drug use: No     Colonoscopy:  PAP:  Bone density:  Lipid panel:  No Known Allergies  Current Outpatient Medications  Medication Sig Dispense Refill  . apixaban (ELIQUIS) 5 MG TABS tablet Take 1 tablet (5 mg total) by mouth 2 (two) times daily. 60 tablet 0  . metFORMIN (GLUCOPHAGE-XR) 750 MG 24 hr tablet Take 750 mg by mouth daily.    . methocarbamol (ROBAXIN) 500 MG tablet Take 1 tablet (500 mg total) by mouth every 6 (six) hours as   needed for muscle spasms. (Patient not taking: Reported on 05/19/2017) 12 tablet 0   No current facility-administered medications for this visit.     OBJECTIVE: There were no vitals filed for this visit.   There is no height or weight on file to calculate BMI.    ECOG FS:0 - Asymptomatic  General: Well-developed, well-nourished, no  acute distress. Eyes: Pink conjunctiva, anicteric sclera. Lungs: Clear to auscultation bilaterally. Heart: Regular rate and rhythm. No rubs, murmurs, or gallops. Abdomen: Soft, nontender, nondistended. No organomegaly noted, normoactive bowel sounds. Musculoskeletal: No edema, cyanosis, or clubbing. Neuro: Alert, answering all questions appropriately. Cranial nerves grossly intact. Skin: No rashes or petechiae noted. Psych: Normal affect. Lymphatics: No cervical, calvicular, axillary or inguinal LAD.   LAB RESULTS:  Lab Results  Component Value Date   NA 139 05/20/2017   K 4.7 05/20/2017   CL 102 05/20/2017   CO2 28 05/20/2017   GLUCOSE 232 (H) 05/20/2017   BUN 19 05/20/2017   CREATININE 1.22 05/23/2017   CALCIUM 8.6 (L) 05/20/2017   PROT 7.1 05/19/2017   ALBUMIN 3.9 05/19/2017   AST 19 05/19/2017   ALT 16 (L) 05/19/2017   ALKPHOS 53 05/19/2017   BILITOT 1.0 05/19/2017   GFRNONAA >60 05/23/2017   GFRAA >60 05/23/2017    Lab Results  Component Value Date   WBC 87.9 (HH) 05/25/2017   NEUTROABS 8.6 (H) 05/19/2017   HGB 12.2 (L) 05/25/2017   HCT 38.6 (L) 05/25/2017   MCV 90.7 05/25/2017   PLT 155 05/25/2017     STUDIES: Dg Chest 2 View  Result Date: 05/23/2017 CLINICAL DATA:  Fever EXAM: CHEST  2 VIEW COMPARISON:  Chest CT 10/29/2016 FINDINGS: Patchy opacity in the right lower lung could reflect atelectasis or pneumonia. Left lung clear. Heart is normal size. No effusions or acute bony abnormality. IMPRESSION: Right lower lung airspace opacity, atelectasis versus pneumonia. Electronically Signed   By: Rolm Baptise M.D.   On: 05/23/2017 09:42   US Venous Img Lower Unilateral Right  Result Date: 05/19/2017 CLINICAL DATA:  Pain and swelling in RIGHT lower extremity, prior history of DVT and pulmonary embolism EXAM: RIGHT LOWER EXTREMITY VENOUS DOPPLER ULTRASOUND TECHNIQUE: Gray-scale sonography with graded compression, as well as color Doppler and duplex ultrasound were  performed to evaluate the lower extremity deep venous systems from the level of the common femoral vein and including the common femoral, femoral, profunda femoral, popliteal and calf veins including the posterior tibial, peroneal and gastrocnemius veins when visible. The superficial great saphenous vein was also interrogated. Spectral Doppler was utilized to evaluate flow at rest and with distal augmentation maneuvers in the common femoral, femoral and popliteal veins. COMPARISON:  None. FINDINGS: Contralateral Common Femoral Vein: Respiratory phasicity is normal and symmetric with the symptomatic side. No evidence of thrombus. Normal compressibility. Common Femoral Vein: Occlusive hypoechoic thrombus, new. Noncompressible. Saphenofemoral Junction: Thrombus extending into greater saphenous vein confluence Profunda Femoral Vein: Occlusive hypoechoic thrombus, new. Noncompressible. Femoral Vein: Occlusive hypoechoic thrombus, new.  Noncompressible. Popliteal Vein: Occlusive hypoechoic thrombus, new. Noncompressible. Calf Veins: Unable to adequately visualize due to body habitus and swelling Superficial Great Saphenous Vein: Thrombus identified within greater saphenous vein at the RIGHT thigh. Venous Reflux:  N/A Other Findings:  N/A IMPRESSION: Extensive acute appearing deep venous thrombosis in the RIGHT lower extremity as above, new since 06/30/2016. Electronically Signed   By: Lavonia Dana M.D.   On: 05/19/2017 20:32    ASSESSMENT: CLL, Rai stage 0, right femoral DVT  PLAN:    1. CLL: Patient's most recent white blood cell count remains elevated at 82.6, but this has been approximately his baseline since March 2018. Previously his baseline was approximately 60 since 2015.  Patient was noted to have a CD7 aberrant T cell antigen on peripheral blood flow cytometry which occasionally can be considered poor prognosis. CT scan results from October 2015 revealed only mild lymphadenopathy. This only needs to be  repeated if there is concern of progression of disease and treatment is necessary.  Patient does not require treatment at this time. He does not require bone marrow biopsy. Return to clinic in 6 months with repeat laboratory work and further evaluation.   2. 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 He has now discontinued driving. Full hypercoagulable workup was negative. His dRVVT was elevated, likely secondary to his anticoagulation. Patient has now completed greater than 6 months of anticoagulation and can discontinue treatment. He was educated on risk factors for developing another DVT as well as instructed that he is at higher risk for developing a second blood clot over the general population. Return to clinic as above.   Approximately 30 minutes was spent in discussion of which greater than 50% was consultation.  Patient expressed understanding and was in agreement with this plan. He also understands that He can call clinic at any time with any questions, concerns, or complaints.     Timothy J Finnegan, MD   06/06/2017 10:26 AM      

## 2017-06-08 ENCOUNTER — Ambulatory Visit (INDEPENDENT_AMBULATORY_CARE_PROVIDER_SITE_OTHER): Payer: Medicaid Other | Admitting: Vascular Surgery

## 2017-06-11 ENCOUNTER — Inpatient Hospital Stay: Payer: Medicaid Other | Attending: Oncology | Admitting: Oncology

## 2017-06-18 ENCOUNTER — Other Ambulatory Visit: Payer: Self-pay

## 2017-06-18 ENCOUNTER — Inpatient Hospital Stay
Admission: EM | Admit: 2017-06-18 | Discharge: 2017-06-20 | DRG: 300 | Disposition: A | Discharge status: Home / Self Care | Payer: Medicaid Other | Attending: Internal Medicine | Admitting: Internal Medicine

## 2017-06-18 ENCOUNTER — Emergency Department: Payer: Medicaid Other

## 2017-06-18 DIAGNOSIS — I82409 Acute embolism and thrombosis of unspecified deep veins of unspecified lower extremity: Secondary | ICD-10-CM | POA: Diagnosis not present

## 2017-06-18 DIAGNOSIS — Z7984 Long term (current) use of oral hypoglycemic drugs: Secondary | ICD-10-CM

## 2017-06-18 DIAGNOSIS — Z95828 Presence of other vascular implants and grafts: Secondary | ICD-10-CM

## 2017-06-18 DIAGNOSIS — Z7901 Long term (current) use of anticoagulants: Secondary | ICD-10-CM | POA: Diagnosis not present

## 2017-06-18 DIAGNOSIS — E119 Type 2 diabetes mellitus without complications: Secondary | ICD-10-CM | POA: Diagnosis present

## 2017-06-18 DIAGNOSIS — C9110 Chronic lymphocytic leukemia of B-cell type not having achieved remission: Secondary | ICD-10-CM | POA: Diagnosis present

## 2017-06-18 DIAGNOSIS — I82411 Acute embolism and thrombosis of right femoral vein: Principal | ICD-10-CM | POA: Diagnosis present

## 2017-06-18 DIAGNOSIS — Z86718 Personal history of other venous thrombosis and embolism: Secondary | ICD-10-CM

## 2017-06-18 DIAGNOSIS — I82403 Acute embolism and thrombosis of unspecified deep veins of lower extremity, bilateral: Secondary | ICD-10-CM | POA: Diagnosis present

## 2017-06-18 DIAGNOSIS — M79604 Pain in right leg: Secondary | ICD-10-CM | POA: Diagnosis not present

## 2017-06-18 HISTORY — DX: Acute embolism and thrombosis of unspecified vein: I82.90

## 2017-06-18 LAB — COMPREHENSIVE METABOLIC PANEL
ALBUMIN: 4 g/dL (ref 3.5–5.0)
ALK PHOS: 60 U/L (ref 38–126)
ALT: 19 U/L (ref 17–63)
AST: 20 U/L (ref 15–41)
Anion gap: 8 (ref 5–15)
BUN: 11 mg/dL (ref 6–20)
CHLORIDE: 106 mmol/L (ref 101–111)
CO2: 24 mmol/L (ref 22–32)
Calcium: 8.9 mg/dL (ref 8.9–10.3)
Creatinine, Ser: 0.9 mg/dL (ref 0.61–1.24)
GFR calc Af Amer: >60 mL/min (ref >60)
GFR calc non Af Amer: >60 mL/min (ref >60)
GLUCOSE: 116 mg/dL — AB (ref 65–99)
Potassium: 4.4 mmol/L (ref 3.5–5.1)
SODIUM: 138 mmol/L (ref 135–145)
Total Bilirubin: 1.5 mg/dL — ABNORMAL HIGH (ref 0.3–1.2)
Total Protein: 6.9 g/dL (ref 6.5–8.1)

## 2017-06-18 LAB — CBC WITH DIFFERENTIAL/PLATELET
BAND NEUTROPHILS: 0 %
BASOS PCT: 0 %
Basophils Absolute: 0 10*3/uL (ref 0–0.1)
Blasts: 0 %
EOS ABS: 0 10*3/uL (ref 0–0.7)
EOS PCT: 0 %
HCT: 37 % — ABNORMAL LOW (ref 40.0–52.0)
Hemoglobin: 11.6 g/dL — ABNORMAL LOW (ref 13.0–18.0)
LYMPHS ABS: 55.5 10*3/uL — AB (ref 1.0–3.6)
Lymphocytes Relative: 90 %
MCH: 28.2 pg (ref 26.0–34.0)
MCHC: 31.3 g/dL — ABNORMAL LOW (ref 32.0–36.0)
MCV: 90 fL (ref 80.0–100.0)
METAMYELOCYTES PCT: 0 %
MONO ABS: 0.6 10*3/uL (ref 0.2–1.0)
MYELOCYTES: 0 %
Monocytes Relative: 1 %
NRBC: 0 /100{WBCs}
Neutro Abs: 5.6 10*3/uL (ref 1.4–6.5)
Neutrophils Relative %: 9 %
Other: 0 %
PLATELETS: 124 10*3/uL — AB (ref 150–440)
Promyelocytes Absolute: 0 %
RBC: 4.12 MIL/uL — ABNORMAL LOW (ref 4.40–5.90)
RDW: 17.1 % — AB (ref 11.5–14.5)
WBC: 61.7 10*3/uL (ref 3.8–10.6)

## 2017-06-18 LAB — PROTIME-INR
INR: 1.25
Prothrombin Time: 15.6 seconds — ABNORMAL HIGH (ref 11.4–15.2)

## 2017-06-18 LAB — APTT: APTT: 32 s (ref 24–36)

## 2017-06-18 MED ORDER — HEPARIN BOLUS VIA INFUSION
5000.0000 [IU] | Freq: Once | INTRAVENOUS | Status: AC
  Administered 2017-06-18: 5000 [IU] via INTRAVENOUS
  Filled 2017-06-18: qty 5000

## 2017-06-18 MED ORDER — MORPHINE SULFATE (PF) 2 MG/ML IV SOLN
2.0000 mg | INTRAVENOUS | Status: DC | PRN
  Administered 2017-06-19 (×2): 2 mg via INTRAVENOUS
  Filled 2017-06-18 (×2): qty 1

## 2017-06-18 MED ORDER — INSULIN ASPART 100 UNIT/ML ~~LOC~~ SOLN
0.0000 [IU] | Freq: Three times a day (TID) | SUBCUTANEOUS | Status: DC
  Filled 2017-06-18 (×15): qty 0.09

## 2017-06-18 MED ORDER — MORPHINE SULFATE (PF) 4 MG/ML IV SOLN
8.0000 mg | Freq: Once | INTRAVENOUS | Status: AC
  Administered 2017-06-18: 8 mg via INTRAVENOUS
  Filled 2017-06-18: qty 2

## 2017-06-18 MED ORDER — ONDANSETRON HCL 4 MG/2ML IJ SOLN
4.0000 mg | Freq: Once | INTRAMUSCULAR | Status: AC
  Administered 2017-06-18: 4 mg via INTRAVENOUS
  Filled 2017-06-18: qty 2

## 2017-06-18 MED ORDER — KETOROLAC TROMETHAMINE 30 MG/ML IJ SOLN
15.0000 mg | Freq: Once | INTRAMUSCULAR | Status: AC
  Administered 2017-06-18: 15 mg via INTRAVENOUS
  Filled 2017-06-18: qty 1

## 2017-06-18 MED ORDER — INSULIN ASPART 100 UNIT/ML ~~LOC~~ SOLN: 0.0000 [IU] | Freq: Three times a day (TID) | SUBCUTANEOUS | Status: DC

## 2017-06-18 MED ORDER — HEPARIN (PORCINE) IN NACL 100-0.45 UNIT/ML-% IJ SOLN
1800.0000 [IU]/h | INTRAMUSCULAR | Status: DC
  Administered 2017-06-18: 1800 [IU]/h via INTRAVENOUS
  Filled 2017-06-18 (×2): qty 250

## 2017-06-18 NOTE — H&P (Signed)
[facility] NAME: Anthony Hart    MR#:  [id number]  DATE OF BIRTH:  Aug 10, 1955  DATE OF ADMISSION:  06/18/2017  PRIMARY CARE PHYSICIAN: [name], MD   REQUESTING/REFERRING PHYSICIAN: Rifenbark  CHIEF COMPLAINT:   Chief Complaint  Patient presents with  . Leg Pain    HISTORY OF PRESENT ILLNESS: Anthony Hart  is a 62 y.o. male with a known history of CLL, diabetes, blood clots in vein repeatedly- status post IVC filter, was taking warfarin after his second time DVT which she took almost 6 month and then hematologist suggested to stop that, after stopping it within few weeks he had DVT again and he was admitted last month for that. That was his third time DVT episode. He preferred to take Eliquis at that time, which he is taking regularly without missing any doses, for last 2 days he started noticing swelling on his right leg again and came to emergency room. Noted to have a DVT episode again. ER physician started on heparin IV drip because of his recurrent DVT episodes. And given to hospitalist team for further management.  PAST MEDICAL HISTORY:   Past Medical History:  Diagnosis Date  . [name]   . Cancer ([name])    CLL monitored   . Clotting disorder ([name])   . Diabetes mellitus without complication ([name])    Type II    PAST SURGICAL HISTORY:  Past Surgical History:  Procedure Laterality Date  . APPENDECTOMY    . COLONOSCOPY WITH PROPOFOL  02/04/2015   Procedure: COLONOSCOPY WITH PROPOFOL;  Surgeon: [name], MD;  Location: [facility] ENDOSCOPY;  Service: Gastroenterology;;  . ESOPHAGOGASTRODUODENOSCOPY (EGD) WITH PROPOFOL N/A 02/04/2015   Procedure: ESOPHAGOGASTRODUODENOSCOPY (EGD) WITH PROPOFOL;  Surgeon: [name], MD;  Location: [facility] ENDOSCOPY;  Service: Gastroenterology;  Laterality: N/A;  . PERIPHERAL VASCULAR THROMBECTOMY Right 07/02/2016   Procedure: Peripheral Vascular Thrombectomy and IVC filter;  Surgeon:  [name], MD;  Location: [facility] CV LAB;  Service: Cardiovascular;  Laterality: Right;  . PERIPHERAL VASCULAR THROMBECTOMY Bilateral 10/30/2016   Procedure: Peripheral Vascular Thrombectomy;  Surgeon: [name], MD;  Location: [place] CV LAB;  Service: Cardiovascular;  Laterality: Bilateral;  . PERIPHERAL VASCULAR THROMBECTOMY Right 05/24/2017   Procedure: PERIPHERAL VASCULAR THROMBECTOMY;  Surgeon: [name], MD;  Location: [facility] CV LAB;  Service: Cardiovascular;  Laterality: Right;    SOCIAL HISTORY:  Social History   Tobacco Use  . Smoking status: Never Smoker  . Smokeless tobacco: Never Used  Substance Use Topics  . Alcohol use: No    FAMILY HISTORY:  Family History  Problem Relation Age of Onset  . Diabetes Mother   . Cancer Brother   . Cancer Maternal Aunt   . Stroke Maternal Uncle     DRUG ALLERGIES: No Known Allergies  REVIEW OF SYSTEMS:   CONSTITUTIONAL: No fever, fatigue or weakness.  EYES: No blurred or double vision.  EARS, NOSE, AND THROAT: No tinnitus or ear pain.  RESPIRATORY: No cough, shortness of breath, wheezing or hemoptysis.  CARDIOVASCULAR: No chest pain, orthopnea, edema.  GASTROINTESTINAL: No nausea, vomiting, diarrhea or abdominal pain.  GENITOURINARY: No dysuria, hematuria.  ENDOCRINE: No polyuria, nocturia,  HEMATOLOGY: No anemia, easy bruising or bleeding SKIN: No rash or lesion. MUSCULOSKELETAL: No joint pain or arthritis.  Swelling on the right leg. NEUROLOGIC: No tingling, numbness, weakness.  PSYCHIATRY: No anxiety or depression.  MEDICATIONS AT HOME:  Prior to Admission medications   Medication Sig Start Date End Date Taking? Authorizing Provider  apixaban (ELIQUIS) 5 MG TABS tablet Take 1 tablet (5 mg total) by mouth 2 (two) times daily. 05/25/17  Yes Salary, Avel Peace, MD  metFORMIN (GLUCOPHAGE-XR) 750 MG 24 hr tablet Take 750 mg by mouth daily. 03/09/16 06/18/17 Yes [provider]  methocarbamol  (ROBAXIN) 500 MG tablet Take 1 tablet (500 mg total) by mouth every 6 (six) hours as needed for muscle spasms. Patient not taking: Reported on 05/19/2017 05/17/17   [name], PA-C      PHYSICAL EXAMINATION:   VITAL SIGNS: Blood pressure 127/85, pulse 67, temperature 98 F (36.7 C), temperature source Oral, resp. rate 14, height 6\' 3"  (1.905 m), weight 116.1 kg (256 lb), SpO2 99 %.  GENERAL:  62 y.o.-year-old patient lying in the bed with no acute distress.  EYES: Pupils equal, round, reactive to light and accommodation. No scleral icterus. Extraocular muscles intact.  HEENT: Head atraumatic, normocephalic. Oropharynx and nasopharynx clear.  NECK:  Supple, no jugular venous distention. No thyroid enlargement, no tenderness.  LUNGS: Normal breath sounds bilaterally, no wheezing, rales,rhonchi or crepitation. No use of accessory muscles of respiration.  CARDIOVASCULAR: S1, S2 normal. No murmurs, rubs, or gallops.  ABDOMEN: Soft, nontender, nondistended. Bowel sounds present. No organomegaly or mass.  EXTREMITIES: No cyanosis, or clubbing. Right lower extremity has significant swelling. NEUROLOGIC: Cranial nerves II through XII are intact. Muscle strength 5/5 in all extremities. Sensation intact. Gait not checked.  PSYCHIATRIC: The patient is alert and oriented x 3.  SKIN: No obvious rash, lesion, or ulcer.   LABORATORY PANEL:   CBC Recent Labs  Lab 06/18/17 1649  WBC 61.7*  HGB 11.6*  HCT 37.0*  PLT 124*  MCV 90.0  MCH 28.2  MCHC 31.3*  RDW 17.1*  LYMPHSABS 55.5*  MONOABS 0.6  EOSABS 0.0  BASOSABS 0.0   ------------------------------------------------------------------------------------------------------------------  Chemistries  Recent Labs  Lab 06/18/17 1649  NA 138  K 4.4  CL 106  CO2 24  GLUCOSE 116*  BUN 11  CREATININE 0.90  CALCIUM 8.9  AST 20  ALT 19  ALKPHOS 60  BILITOT 1.5*    ------------------------------------------------------------------------------------------------------------------ estimated creatinine clearance is 121.4 mL/min (by C-G formula based on SCr of 0.9 mg/dL). ------------------------------------------------------------------------------------------------------------------ No results for input(s): TSH, T4TOTAL, T3FREE, THYROIDAB in the last 72 hours.  Invalid input(s): FREET3   Coagulation profile Recent Labs  Lab 06/18/17 1649  INR 1.25   ------------------------------------------------------------------------------------------------------------------- No results for input(s): DDIMER in the last 72 hours. -------------------------------------------------------------------------------------------------------------------  Cardiac Enzymes No results for input(s): CKMB, TROPONINI, MYOGLOBIN in the last 168 hours.  Invalid input(s): CK ------------------------------------------------------------------------------------------------------------------ Invalid input(s): POCBNP  ---------------------------------------------------------------------------------------------------------------  Urinalysis    Component Value Date/Time   COLORURINE YELLOW (A) 05/23/2017 1030   APPEARANCEUR CLEAR (A) 05/23/2017 1030   APPEARANCEUR Clear 12/23/2013 1350   LABSPEC 1.020 05/23/2017 1030   LABSPEC 1.020 12/23/2013 1350   PHURINE 5.0 05/23/2017 1030   GLUCOSEU NEGATIVE 05/23/2017 1030   GLUCOSEU Negative 12/23/2013 1350   HGBUR MODERATE (A) 05/23/2017 1030   BILIRUBINUR NEGATIVE 05/23/2017 1030   BILIRUBINUR Negative 12/23/2013 1350   KETONESUR NEGATIVE 05/23/2017 1030   PROTEINUR NEGATIVE 05/23/2017 1030   NITRITE NEGATIVE 05/23/2017 1030   LEUKOCYTESUR NEGATIVE 05/23/2017 1030   LEUKOCYTESUR Negative 12/23/2013 1350     RADIOLOGY: US Venous Img Lower Unilateral Right  Result Date: 06/18/2017 CLINICAL DATA:  Right lower extremity  swelling. History  of DVT. History of thrombectomy and IVC filter placement. EXAM: RIGHT LOWER EXTREMITY VENOUS DOPPLER ULTRASOUND TECHNIQUE: Gray-scale sonography with graded compression, as well as color Doppler and duplex ultrasound were performed to evaluate the lower extremity deep venous systems from the level of the common femoral vein and including the common femoral, femoral, profunda femoral, popliteal and calf veins including the posterior tibial, peroneal and gastrocnemius veins when visible. The superficial great saphenous vein was also interrogated. Spectral Doppler was utilized to evaluate flow at rest and with distal augmentation maneuvers in the common femoral, femoral and popliteal veins. COMPARISON:  05/19/2017 FINDINGS: Contralateral Common Femoral Vein: Normal compressibility, color Doppler flow and phasicity in the left common femoral vein. Common Femoral Vein: Partial compressibility of the right common femoral vein with echogenic thrombus. Saphenofemoral Junction: There appears to be nonocclusive thrombus at the saphenofemoral junction. Profunda Femoral Vein: Occluded with echogenic thrombus. Femoral Vein: Occluded with echogenic thrombus. Popliteal Vein: Limited evaluation but there is concern for nonocclusive thrombus. No significant compressibility in the popliteal vein. Calf Veins: Limited evaluation due to edema. Other Findings: Prominent lymph nodes in the right groin are likely reactive. IMPRESSION: Positive for deep venous thrombosis in the right lower extremity. There is DVT in the right common femoral vein, profunda femoral vein and the right femoral vein. Probable thrombus within the right popliteal vein but limited evaluation. Limited evaluation of the right calf veins. Electronically Signed   By: Markus Daft M.D.   On: 06/18/2017 17:53    EKG: Orders placed or performed during the hospital encounter of 05/19/17  . ED EKG  . ED EKG    IMPRESSION AND PLAN:  * Recurrent  DVT   As patient had tried Coumadin in the past and that was working, he may need to go back on Coumadin for this time.  IV heparin is started by ER physician, due to recurrent episodes I would like to have a hematology consult in hospital.   Patient is not very excited about using Coumadin again, he requested to try "the other new drug"- xarelto.   I will leave it up to hematology to decide on that.   He is already having an IVC filter.  * CLL   Hematology consult.  * Diabetes   Continue metformin, keep on sliding scale coverage.  All the records are reviewed and case discussed with ED provider. Management plans discussed with the patient, family and they are in agreement.  CODE STATUS: full. Code Status History    Date Active Date Inactive Code Status Order ID Comments User Context   05/20/2017 00:47 05/25/2017 17:14 Full Code [id number]  Lance Coon, MD Inpatient   10/29/2016 02:11 11/04/2016 19:43 Full Code [id number]  Harvie Bridge, DO ED   06/30/2016 23:35 07/03/2016 19:43 Full Code [id number]  [name], MD Inpatient       TOTAL TIME TAKING CARE OF THIS PATIENT: 45 minutes.    Vaughan Basta M.D on 06/18/2017   Between 7am to 6pm - Pager - [phone]  After 6pm go to [email or web address] - password [email or web address] [facility]  Office  [phone]  CC: Primary care physician; [name], MD   Note: This dictation was prepared with Dragon dictation along with smaller phrase technology. Any transcriptional errors that result from this process are unintentional.

## 2017-06-18 NOTE — ED Triage Notes (Addendum)
Right leg pain X 4 days. Pt has had 3 blood clots in same leg prior, requiring intervention to remove; most recent in February. Takes eliquis. Swelling and pain behind right knee and into right calf.

## 2017-06-18 NOTE — ED Notes (Signed)
ED TO INPATIENT HANDOFF REPORT  Name/Age/Gender Anthony Hart. 62 y.o. male  Code Status Code Status History    Date Active Date Inactive Code Status Order ID Comments User Context   05/20/2017 00:47 05/25/2017 17:14 Full Code [id number]  Lance Coon, MD Inpatient   10/29/2016 02:11 11/04/2016 19:43 Full Code [id number]  Harvie Bridge, DO ED   06/30/2016 23:35 07/03/2016 19:43 Full Code [id number]  [name], MD Inpatient      Home/SNF/Other Home  Chief Complaint Right leg pain  Level of Care/Admitting Diagnosis ED Disposition    ED Disposition Condition [place]: [facility] [[id number]]  Level of Care: Med-Surg [16]  Diagnosis: DVT (deep venous thrombosis) [facility]) [[id number]]  Admitting Physician: Vaughan Basta [phone]  Attending Physician: Vaughan Basta [phone]  Estimated length of stay: past midnight tomorrow  Certification:: I certify this patient will need inpatient services for at least 2 midnights  Bed request comments: place on 1 c, if possible.  PT Class (Do Not Modify): Inpatient [101]  PT Acc Code (Do Not Modify): Private [1]       Medical History Past Medical History:  Diagnosis Date  . [name]   . Cancer ([place])    CLL monitored   . Clotting disorder ([place])   . Diabetes mellitus without complication ([place])    Type II    Allergies No Known Allergies  IV Location/Drains/Wounds Patient Lines/Drains/Airways Status   Active Line/Drains/Airways    Name:   Placement date:   Placement time:   Site:   Days:   Peripheral IV 06/18/17 Left Forearm   06/18/17    1932    Forearm   less than 1   Peripheral IV 06/18/17 Right Antecubital   06/18/17    1941    Antecubital   less than 1   Sheath 10/30/16 Left Venous   10/30/16    1446    Venous   231   Incision (Closed) Knee Right;Posterior   -    -        Incision (Closed) Knee Left;Posterior   -    -               Labs/Imaging Results for  orders placed or performed during the hospital encounter of 06/18/17 (from the past 48 hour(s))  Comprehensive metabolic panel     Status: Abnormal   Collection Time: 06/18/17  4:49 PM  Result Value Ref Range   Sodium 138 135 - 145 mmol/L   Potassium 4.4 3.5 - 5.1 mmol/L   Chloride 106 101 - 111 mmol/L   CO2 24 22 - 32 mmol/L   Glucose, Bld 116 (H) 65 - 99 mg/dL   BUN 11 6 - 20 mg/dL   Creatinine, Ser 0.90 0.61 - 1.24 mg/dL   Calcium 8.9 8.9 - 10.3 mg/dL   Total Protein 6.9 6.5 - 8.1 g/dL   Albumin 4.0 3.5 - 5.0 g/dL   AST 20 15 - 41 U/L   ALT 19 17 - 63 U/L   Alkaline Phosphatase 60 38 - 126 U/L   Total Bilirubin 1.5 (H) 0.3 - 1.2 mg/dL   GFR calc non Af Amer >60 >60 mL/min   GFR calc Af Amer >60 >60 mL/min    Comment: (NOTE) The eGFR has been calculated using the CKD EPI equation. This calculation has not been validated in all clinical situations. eGFR's persistently <60 mL/min signify possible Chronic Kidney Disease.  Anion gap 8 5 - 15    Comment: Performed at [facility], [address] [place], [place] [address]  CBC with Differential     Status: Abnormal   Collection Time: 06/18/17  4:49 PM  Result Value Ref Range   WBC 61.7 (HH) 3.8 - 10.6 K/uL    Comment: CRITICAL RESULT CALLED TO, READ BACK BY AND VERIFIED WITH: Leonides Schanz AT 2549 06/18/2017.  TFK    RBC 4.12 (L) 4.40 - 5.90 MIL/uL   Hemoglobin 11.6 (L) 13.0 - 18.0 g/dL   HCT 37.0 (L) 40.0 - 52.0 %   MCV 90.0 80.0 - 100.0 fL   MCH 28.2 26.0 - 34.0 pg   MCHC 31.3 (L) 32.0 - 36.0 g/dL   RDW 17.1 (H) 11.5 - 14.5 %   Platelets 124 (L) 150 - 440 K/uL   Neutrophils Relative % 9 %   Lymphocytes Relative 90 %   Monocytes Relative 1 %   Eosinophils Relative 0 %   Basophils Relative 0 %   Band Neutrophils 0 %   Metamyelocytes Relative 0 %   Myelocytes 0 %   Promyelocytes Absolute 0 %   Blasts 0 %   nRBC 0 0 /100 WBC   Other 0 %   Neutro Abs 5.6 1.4 - 6.5 K/uL   Lymphs Abs 55.5 (H) 1.0 - 3.6  K/uL   Monocytes Absolute 0.6 0.2 - 1.0 K/uL   Eosinophils Absolute 0.0 0 - 0.7 K/uL   Basophils Absolute 0.0 0 - 0.1 K/uL   WBC Morphology ATYPICAL LYMPHOCYTES    Smear Review MORPHOLOGY UNREMARKABLE     Comment: Performed at [facility], [address] [place], [place] [address]  Protime-INR     Status: Abnormal   Collection Time: 06/18/17  4:49 PM  Result Value Ref Range   Prothrombin Time 15.6 (H) 11.4 - 15.2 seconds   INR 1.25     Comment: Performed at [facility], [address] [place], [place] [address]  APTT     Status: None   Collection Time: 06/18/17  4:49 PM  Result Value Ref Range   aPTT 32 24 - 36 seconds    Comment: Performed at [facility], [address] [place], [place] [address]   US Venous Img Lower Unilateral Right  Result Date: 06/18/2017 CLINICAL DATA:  Right lower extremity swelling. History of DVT. History of thrombectomy and IVC filter placement. EXAM: RIGHT LOWER EXTREMITY VENOUS DOPPLER ULTRASOUND TECHNIQUE: Gray-scale sonography with graded compression, as well as color Doppler and duplex ultrasound were performed to evaluate the lower extremity deep venous systems from the level of the common femoral vein and including the common femoral, femoral, profunda femoral, popliteal and calf veins including the posterior tibial, peroneal and gastrocnemius veins when visible. The superficial great saphenous vein was also interrogated. Spectral Doppler was utilized to evaluate flow at rest and with distal augmentation maneuvers in the common femoral, femoral and popliteal veins. COMPARISON:  05/19/2017 FINDINGS: Contralateral Common Femoral Vein: Normal compressibility, color Doppler flow and phasicity in the left common femoral vein. Common Femoral Vein: Partial compressibility of the right common femoral vein with echogenic thrombus. Saphenofemoral Junction: There appears to be nonocclusive thrombus at the saphenofemoral junction. Profunda  Femoral Vein: Occluded with echogenic thrombus. Femoral Vein: Occluded with echogenic thrombus. Popliteal Vein: Limited evaluation but there is concern for nonocclusive thrombus. No significant compressibility in the popliteal vein. Calf Veins: Limited evaluation due to edema. Other Findings: Prominent  lymph nodes in the right groin are likely reactive. IMPRESSION: Positive for deep venous thrombosis in the right lower extremity. There is DVT in the right common femoral vein, profunda femoral vein and the right femoral vein. Probable thrombus within the right popliteal vein but limited evaluation. Limited evaluation of the right calf veins. Electronically Signed   By: Markus Daft M.D.   On: 06/18/2017 17:53    Pending Labs Unresulted Labs (From admission, onward)   Start     Ordered   06/19/17 0200  Heparin level (unfractionated)  Once-Timed,   STAT     06/18/17 2033   Signed and Held  Basic metabolic panel  Tomorrow morning,   R     Signed and Held   Signed and Held  CBC  Tomorrow morning,   R     Signed and Held      Vitals/Pain Today's Vitals   06/18/17 1936 06/18/17 2210 06/18/17 2210 06/18/17 2303  BP: 127/85  125/82 128/76  Pulse: 67  (!) 59 (!) 53  Resp: '14  17 14  ' Temp:      TempSrc:      SpO2: 99%  99%   Weight:      Height:      PainSc: 8  7       Isolation Precautions No active isolations  Medications Medications  heparin ADULT infusion 100 units/mL (25000 units/230m sodium chloride 0.45%) (1,800 Units/hr Intravenous New Bag/Given 06/18/17 2003)  morphine 2 MG/ML injection 2 mg (not administered)  insulin aspart (novoLOG) injection 0-9 Units (not administered)  morphine 4 MG/ML injection 8 mg (8 mg Intravenous Given 06/18/17 1933)  ondansetron (ZOFRAN) injection 4 mg (4 mg Intravenous Given 06/18/17 1933)  ketorolac (TORADOL) 30 MG/ML injection 15 mg (15 mg Intravenous Given 06/18/17 1933)  heparin bolus via infusion 5,000 Units (5,000 Units Intravenous Bolus from Bag  06/18/17 2003)    Mobility walks

## 2017-06-18 NOTE — Progress Notes (Signed)
[place] for heparin Indication: DVT  No Known Allergies  Patient Measurements: Height: 6\' 3"  (190.5 cm) Weight: 256 lb (116.1 kg) IBW/kg (Calculated) : 84.5 Heparin Dosing Weight: 108.8 kg  Vital Signs: Temp: 98 F (36.7 C) (03/08 1647) Temp Source: Oral (03/08 1647) BP: 127/85 (03/08 1936) Pulse Rate: 67 (03/08 1936)  Labs: Recent Labs    06/18/17 1649  HGB 11.6*  HCT 37.0*  PLT 124*  APTT 32  LABPROT 15.6*  INR 1.25  CREATININE 0.90    Estimated Creatinine Clearance: 121.4 mL/min (by C-G formula based on SCr of 0.9 mg/dL).   Medical History: Past Medical History:  Diagnosis Date  . [name]   . Cancer ([place])    CLL monitored   . Clotting disorder ([place])   . Diabetes mellitus without complication ([place])    Type II    Medications:  Patient takes Apixaban 5mg  BID at home. His last dose was 3/8 @ 1300.   Assessment: 62 yo male admitted with DVT. Patient has history of 3 blood clots in same leg requiring intervention to remove. His most recent was February. He has swelling and pain in his right knee and calf.   Goal of Therapy:  Heparin level 0.3-0.7 units/ml Monitor platelets by anticoagulation protocol: Yes   Plan:  Give 5000 units bolus x 1 Start heparin infusion at 1800 units/hr Check anti-Xa level in 6 hours and daily while on heparin Continue to monitor H&H and platelets  Gave patient bolus of 5000 units because he had a dose of apixaban 5 mg at 1300 and labs had not resulted at time of consult. Will order follow up labs. Pharmacy will continue to follow and adjust as needed.   Lendon Ka, PharmD Pharmacy Resident 06/18/2017,8:34 PM

## 2017-06-18 NOTE — ED Provider Notes (Signed)
[facility] Emergency Department Provider Note  ____________________________________________   First MD Initiated Contact with Patient 06/18/17 1914     (approximate)  I have reviewed the triage vital signs and the nursing notes.   HISTORY  Chief Complaint Leg Pain   HPI Anthony Hart. is a 62 y.o. male who self presents to the emergency department with 8 days of gradual onset slowly progressive now severe swelling and pain in his right lower extremity.  He has a past medical history of multiple deep vein thromboses and has an IVC filter in place.  He is currently taking Eliquis and reports compliance with all of his medications and has not missed a single dose.  He denies trauma.  He is a Loss adjuster, chartered.  He denies chest pain or shortness of breath.  The pain is in his entire right leg throbbing aching and nonradiating.  It seems to be somewhat worse with walking and somewhat improved with rest.  Past Medical History:  Diagnosis Date  . [name]   . Cancer ([place])    CLL monitored   . Clotting disorder ([place])   . Diabetes mellitus without complication ([place])    Type II    Patient Active Problem List   Diagnosis Date Noted  . DVT (deep venous thrombosis) ([place]) 06/18/2017  . Diabetes ([place]) 05/19/2017  . Abnormal EKG 10/29/2016  . Hyperglycemia 10/29/2016  . Leg DVT (deep venous thromboembolism), acute, bilateral ([place]) 10/28/2016  . CLL (chronic lymphocytic leukemia) ([place]) 07/07/2016  . 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 APPENDECTOMY    . COLONOSCOPY WITH PROPOFOL  02/04/2015   Procedure: COLONOSCOPY WITH PROPOFOL;  Surgeon: [name], MD;  Location: [facility] ENDOSCOPY;  Service: Gastroenterology;;  . ESOPHAGOGASTRODUODENOSCOPY (EGD) WITH PROPOFOL N/A 02/04/2015   Procedure: ESOPHAGOGASTRODUODENOSCOPY (EGD) WITH PROPOFOL;  Surgeon: [name], MD;  Location: [facility] ENDOSCOPY;  Service: Gastroenterology;  Laterality: N/A;  . PERIPHERAL VASCULAR THROMBECTOMY Right 07/02/2016   Procedure: Peripheral Vascular Thrombectomy and IVC filter;  Surgeon: [name], MD;  Location: [place] CV LAB;  Service: Cardiovascular;  Laterality: Right;  . PERIPHERAL VASCULAR THROMBECTOMY Bilateral 10/30/2016   Procedure: Peripheral Vascular Thrombectomy;  Surgeon: [name], MD;  Location: [place] CV LAB;  Service: Cardiovascular;  Laterality: Bilateral;  . PERIPHERAL VASCULAR THROMBECTOMY Right 05/24/2017   Procedure: PERIPHERAL VASCULAR THROMBECTOMY;  Surgeon: [name], MD;  Location: [place] CV LAB;  Service: Cardiovascular;  Laterality: Right;    Prior to Admission medications   Medication Sig Start Date End Date Taking? Authorizing Provider  apixaban (ELIQUIS) 5 MG TABS tablet Take 1 tablet (5 mg total) by mouth 2 (two) times daily. 05/25/17  Yes Salary, Avel Peace, MD  metFORMIN (GLUCOPHAGE-XR) 750 MG 24 hr tablet Take 750 mg by mouth daily. 03/09/16 06/18/17 Yes [provider]  methocarbamol (ROBAXIN) 500 MG tablet Take 1 tablet (500 mg total) by mouth every 6 (six) hours as needed for muscle spasms. Patient not taking: Reported on 05/19/2017 05/17/17   [name], PA-C    Allergies Patient has no known allergies.  Family History  Problem Relation Age of Onset  . Diabetes Mother   . Cancer Brother   . Cancer Maternal Aunt   . Stroke Maternal Uncle     Social History Social History   Tobacco Use  . Smoking  status: Never Smoker  . Smokeless tobacco: Never Used  Substance Use Topics  . Alcohol use: No  . Drug use: No    Review of Systems Constitutional: No fever/chills Eyes: No visual changes. ENT: No sore throat. Cardiovascular: Denies chest pain. Respiratory: Denies shortness of breath. Gastrointestinal: No abdominal pain.  No nausea, no vomiting.  No diarrhea.  No  constipation. Genitourinary: Negative for dysuria. Musculoskeletal: Negative for back pain. Skin: Positive for rash. Neurological: Negative for headaches, focal weakness or numbness.   ____________________________________________   PHYSICAL EXAM:  VITAL SIGNS: ED Triage Vitals [06/18/17 1647]  Enc Vitals Group     BP (!) 107/59     Pulse Rate 70     Resp 18     Temp 98 F (36.7 C)     Temp Source Oral     SpO2 97 %     Weight 256 lb (116.1 kg)     Height 6\' 3"  (1.905 m)     Head Circumference      Peak Flow      Pain Score 9     Pain Loc      Pain Edu?      Excl. in [place]?     Constitutional: Alert and oriented x4 pleasant cooperative seems uncomfortable but no diaphoresis Eyes: PERRL EOMI. Head: Atraumatic. Nose: No congestion/rhinnorhea. Mouth/Throat: No trismus Neck: No stridor.   Cardiovascular: Normal rate, regular rhythm. Grossly normal heart sounds.  Good peripheral circulation. Respiratory: Normal respiratory effort.  No retractions. Lungs CTAB and moving good air Gastrointestinal: Soft nontender Musculoskeletal: Right lower extremity edematous erythematous tender 2+ dorsalis pedis pulse Neurologic:  Normal speech and language. No gross focal neurologic deficits are appreciated. Skin:  Skin is warm, dry and intact. No rash noted. Psychiatric: Mood and affect are normal. Speech and behavior are normal.    ____________________________________________   DIFFERENTIAL includes but not limited to  Deep vein thrombosis, phlegmasia cerulea dolens, phlegmasia alba dolens, claudication, cellulitis, lymphadenitis ____________________________________________   LABS (all labs ordered are listed, but only abnormal results are displayed)  Labs Reviewed  COMPREHENSIVE METABOLIC PANEL - Abnormal; Notable for the following components:      Result Value   Glucose, Bld 116 (*)    Total Bilirubin 1.5 (*)    All other components within normal limits  CBC WITH  DIFFERENTIAL/PLATELET - Abnormal; Notable for the following components:   WBC 61.7 (*)    RBC 4.12 (*)    Hemoglobin 11.6 (*)    HCT 37.0 (*)    MCHC 31.3 (*)    RDW 17.1 (*)    Platelets 124 (*)    Lymphs Abs 55.5 (*)    All other components within normal limits  PROTIME-INR - Abnormal; Notable for the following components:   Prothrombin Time 15.6 (*)    All other components within normal limits  APTT  HEPARIN LEVEL (UNFRACTIONATED)    Lab work reviewed by me with elevated white count although consistent with the patient's known CLL __________________________________________  EKG   ____________________________________________  RADIOLOGY  Ultrasound of the right lower extremity reviewed by me shows extensive deep vein thrombosis ____________________________________________   PROCEDURES  Procedure(s) performed: no  Procedures  Critical Care performed: o  Observation: no ____________________________________________   INITIAL IMPRESSION / ASSESSMENT AND PLAN / ED COURSE  Pertinent labs & imaging results that were available during my care of the patient were reviewed by me and considered in my medical decision making (see chart for details).  The patient is hemodynamically stable and uncomfortable appearing although nontoxic.  Right lower extremity significantly edematous and erythematous and ultrasound confirms significant deep vein thrombosis.  He reports complete compliance with his Eliquis and at this point he is a failure of this medication and does require IV heparin and hematology consultation to consider further anticoagulation in the future.  The patient will likely require lifelong anticoagulation.  There is no indication for emergent intervention at this time.  I discussed the patient who verbalizes understanding and agreement with the plan.  I then discussed with the hospitalist who has graciously agreed to admit the patient to his service.  Patient's pain is  improved following intravenous pain medication.      ____________________________________________   FINAL CLINICAL IMPRESSION(S) / ED DIAGNOSES  Final diagnoses:  Acute deep vein thrombosis (DVT) of femoral vein of right lower extremity (HCC)      NEW MEDICATIONS STARTED DURING THIS VISIT:  New Prescriptions   No medications on file     Note:  This document was prepared using Dragon voice recognition software and may include unintentional dictation errors.     Darel Hong, MD 06/18/17 2221

## 2017-06-19 ENCOUNTER — Other Ambulatory Visit: Payer: Self-pay

## 2017-06-19 DIAGNOSIS — Z86718 Personal history of other venous thrombosis and embolism: Secondary | ICD-10-CM

## 2017-06-19 DIAGNOSIS — Z7901 Long term (current) use of anticoagulants: Secondary | ICD-10-CM

## 2017-06-19 DIAGNOSIS — C9110 Chronic lymphocytic leukemia of B-cell type not having achieved remission: Secondary | ICD-10-CM

## 2017-06-19 DIAGNOSIS — I82409 Acute embolism and thrombosis of unspecified deep veins of unspecified lower extremity: Secondary | ICD-10-CM

## 2017-06-19 DIAGNOSIS — Z95828 Presence of other vascular implants and grafts: Secondary | ICD-10-CM

## 2017-06-19 LAB — CBC
HCT: 33.7 % — ABNORMAL LOW (ref 40.0–52.0)
Hemoglobin: 10.6 g/dL — ABNORMAL LOW (ref 13.0–18.0)
MCH: 28.4 pg (ref 26.0–34.0)
MCHC: 31.5 g/dL — AB (ref 32.0–36.0)
MCV: 90.1 fL (ref 80.0–100.0)
PLATELETS: 100 10*3/uL — AB (ref 150–440)
RBC: 3.74 MIL/uL — ABNORMAL LOW (ref 4.40–5.90)
RDW: 17.4 % — AB (ref 11.5–14.5)
WBC: 63.4 10*3/uL — AB (ref 3.8–10.6)

## 2017-06-19 LAB — HEPARIN LEVEL (UNFRACTIONATED)
HEPARIN UNFRACTIONATED: 1.44 [IU]/mL — AB (ref 0.30–0.70)
Heparin Unfractionated: 0.54 IU/mL (ref 0.30–0.70)
Heparin Unfractionated: 0.33 IU/mL (ref 0.30–0.70)

## 2017-06-19 LAB — BASIC METABOLIC PANEL
Anion gap: 7 (ref 5–15)
BUN: 13 mg/dL (ref 6–20)
CALCIUM: 8.3 mg/dL — AB (ref 8.9–10.3)
CO2: 27 mmol/L (ref 22–32)
Chloride: 106 mmol/L (ref 101–111)
Creatinine, Ser: 0.97 mg/dL (ref 0.61–1.24)
GFR calc Af Amer: >60 mL/min (ref >60)
Glucose, Bld: 145 mg/dL — ABNORMAL HIGH (ref 65–99)
POTASSIUM: 3.5 mmol/L (ref 3.5–5.1)
Sodium: 140 mmol/L (ref 135–145)

## 2017-06-19 LAB — GLUCOSE, CAPILLARY
GLUCOSE-CAPILLARY: 173 mg/dL — AB (ref 65–99)
Glucose-Capillary: 113 mg/dL — ABNORMAL HIGH (ref 65–99)
Glucose-Capillary: 81 mg/dL (ref 65–99)
Glucose-Capillary: 129 mg/dL — ABNORMAL HIGH (ref 65–99)

## 2017-06-19 MED ORDER — HEPARIN (PORCINE) IN NACL 100-0.45 UNIT/ML-% IJ SOLN
1550.0000 [IU]/h | INTRAMUSCULAR | Status: DC
  Administered 2017-06-19 – 2017-06-20 (×2): 1350 [IU]/h via INTRAVENOUS
  Filled 2017-06-19 (×2): qty 250

## 2017-06-19 MED ORDER — METHOCARBAMOL 500 MG PO TABS
500.0000 mg | ORAL_TABLET | Freq: Four times a day (QID) | ORAL | Status: DC | PRN
  Filled 2017-06-19: qty 1

## 2017-06-19 MED ORDER — WARFARIN SODIUM 7.5 MG PO TABS
7.5000 mg | ORAL_TABLET | Freq: Once | ORAL | Status: AC
  Administered 2017-06-19: 7.5 mg via ORAL
  Filled 2017-06-19: qty 1

## 2017-06-19 MED ORDER — DOCUSATE SODIUM 100 MG PO CAPS: 100.0000 mg | ORAL_CAPSULE | Freq: Two times a day (BID) | ORAL | Status: DC | PRN

## 2017-06-19 MED ORDER — METFORMIN HCL ER 750 MG PO TB24
750.0000 mg | ORAL_TABLET | Freq: Every day | ORAL | Status: DC
  Administered 2017-06-19 – 2017-06-20 (×2): 750 mg via ORAL
  Filled 2017-06-19 (×2): qty 1

## 2017-06-19 MED ORDER — WARFARIN - PHARMACIST DOSING INPATIENT: Freq: Every day | Status: DC

## 2017-06-19 NOTE — Progress Notes (Signed)
ANTICOAGULATION CONSULT NOTE   Pharmacy Consult for heparin Indication: DVT  No Known Allergies  Patient Measurements: Height: 6\' 3"  (190.5 cm) Weight: 256 lb (116.1 kg) IBW/kg (Calculated) : 84.5 Heparin Dosing Weight: 108.8 kg  Vital Signs: Temp: 97.8 F (36.6 C) (03/09 0548) Temp Source: Oral (03/09 0548) BP: 115/64 (03/09 0755) Pulse Rate: 61 (03/09 0755)  Labs: Recent Labs    06/18/17 1649 06/19/17 0225 06/19/17 1019  HGB 11.6* 10.6*  --   HCT 37.0* 33.7*  --   PLT 124* 100*  --   APTT 32  --   --   LABPROT 15.6*  --   --   INR 1.25  --   --   HEPARINUNFRC  --  1.44* 0.54  CREATININE 0.90 0.97  --     Estimated Creatinine Clearance: 112.6 mL/min (by C-G formula based on SCr of 0.97 mg/dL).   Medical History: Past Medical History:  Diagnosis Date  . [name]   . Cancer ([place])    CLL monitored   . Clotting disorder ([place])   . Diabetes mellitus without complication ([place])    Type II    Medications:  Patient takes Apixaban 5mg  BID at home. His last dose was 3/8 @ 1300.   Assessment: 62 yo male admitted with DVT. Patient has history of 3 blood clots in same leg requiring intervention to remove. His most recent was February. He has swelling and pain in his right knee and calf.   Goal of Therapy:  Heparin level 0.3-0.7 units/ml Monitor platelets by anticoagulation protocol: Yes   Plan:  Give 5000 units bolus x 1 Start heparin infusion at 1800 units/hr Check anti-Xa level in 6 hours and daily while on heparin Continue to monitor H&H and platelets  Gave patient bolus of 5000 units because he had a dose of apixaban 5 mg at 1300 and labs had not resulted at time of consult. Will order follow up labs. Pharmacy will continue to follow and adjust as needed.   3/9 0200 heparin level 1.44. Hold drip x 1 hour and restart at 1350 units/hr. Next heparin level 6 hours after restart.  3/9  HL@ 1019= 0.54. Hgb 10.6, plt 100. Will continue current rate and  check confirmatory level in 6 hours.  Chinita Greenland PharmD Clinical Pharmacist 06/19/2017

## 2017-06-19 NOTE — Progress Notes (Signed)
[place] for heparin & warfarin Indication: DVT  No Known Allergies  Patient Measurements: Height: 6\' 3"  (190.5 cm) Weight: 256 lb (116.1 kg) IBW/kg (Calculated) : 84.5 Heparin Dosing Weight: 108.8 kg  Vital Signs: Temp: 98.2 F (36.8 C) (03/09 1402) Temp Source: Oral (03/09 1402) BP: 117/64 (03/09 1402) Pulse Rate: 64 (03/09 1402)  Labs: Recent Labs    06/18/17 1649 06/19/17 0225 06/19/17 1019 06/19/17 1859  HGB 11.6* 10.6*  --   --   HCT 37.0* 33.7*  --   --   PLT 124* 100*  --   --   APTT 32  --   --   --   LABPROT 15.6*  --   --   --   INR 1.25  --   --   --   HEPARINUNFRC  --  1.44* 0.54 0.33  CREATININE 0.90 0.97  --   --     Estimated Creatinine Clearance: 112.6 mL/min (by C-G formula based on SCr of 0.97 mg/dL).   Medical History: Past Medical History:  Diagnosis Date  . [name]   . Cancer ([place])    CLL monitored   . Clotting disorder ([place])   . Diabetes mellitus without complication ([place])    Type II    Medications:  Patient takes Apixaban 5mg  BID at home. His last dose was 3/8 @ 1300.   Assessment: 62 yo male admitted with DVT. Patient has history of 3 blood clots in same leg requiring intervention to remove. His most recent was February. He has swelling and pain in his right knee and calf.   DATE INR DOSE   Goal of Therapy:  Heparin level 0.3-0.7 units/ml Monitor platelets by anticoagulation protocol: Yes  INR goal of 2-3   Plan: Will continue heparin infusion at current rate and f/u AM labs.   Ulice Dash, PharmD Clinical Pharmacist  06/19/2017

## 2017-06-19 NOTE — Consult Note (Signed)
[place]  [phone] [phone]  ID: Anthony Hart. OB: 1955-10-30  MR#: [id number]  SKA#:[id number]  Patient Care Team: [name], MD as PCP - General (Family Medicine)  CHIEF COMPLAINT: Recurrent DVT, CLL.  INTERVAL HISTORY: Patient is a 62 year old male with known history of DVTs and CLL who recently presented to the emergency room with a right swollen leg and new DVT despite taking Eliquis.  He currently has leg pain, but otherwise feels well.  He has no neurologic complaints.  He denies any recent fevers or illnesses.  He has a good appetite and denies weight loss.  He denies any night sweats.  He has no chest pain or shortness of breath.  He denies any nausea, vomiting, constipation, or diarrhea.  He has no urinary complaints.  Patient offers no further specific complaints.  REVIEW OF SYSTEMS:   Review of Systems  Constitutional: Negative.  Negative for fever, malaise/fatigue and weight loss.  Respiratory: Negative.  Negative for cough, hemoptysis and shortness of breath.   Cardiovascular: Positive for leg swelling. Negative for chest pain.  Gastrointestinal: Negative.  Negative for abdominal pain, blood in stool and melena.  Genitourinary: Negative for hematuria.  Musculoskeletal: Negative.   Skin: Negative.  Negative for rash.  Neurological: Negative.  Negative for sensory change, focal weakness and weakness.  Endo/Heme/Allergies: Does not bruise/bleed easily.  Psychiatric/Behavioral: Negative.  The patient is not nervous/anxious.     As per HPI. Otherwise, a complete review of systems is negative.  PAST MEDICAL HISTORY: Past Medical History:  Diagnosis Date  . [name]   . Cancer ([place])    CLL monitored   . Clotting disorder ([place])   . Diabetes mellitus without complication ([place])    Type II    PAST SURGICAL HISTORY: Past Surgical History:  Procedure Laterality Date  . APPENDECTOMY    . COLONOSCOPY  WITH PROPOFOL  02/04/2015   Procedure: COLONOSCOPY WITH PROPOFOL;  Surgeon: [name], MD;  Location: [facility] ENDOSCOPY;  Service: Gastroenterology;;  . ESOPHAGOGASTRODUODENOSCOPY (EGD) WITH PROPOFOL N/A 02/04/2015   Procedure: ESOPHAGOGASTRODUODENOSCOPY (EGD) WITH PROPOFOL;  Surgeon: [name], MD;  Location: [facility] ENDOSCOPY;  Service: Gastroenterology;  Laterality: N/A;  . PERIPHERAL VASCULAR THROMBECTOMY Right 07/02/2016   Procedure: Peripheral Vascular Thrombectomy and IVC filter;  Surgeon: [name], MD;  Location: [place] CV LAB;  Service: Cardiovascular;  Laterality: Right;  . PERIPHERAL VASCULAR THROMBECTOMY Bilateral 10/30/2016   Procedure: Peripheral Vascular Thrombectomy;  Surgeon: [name], MD;  Location: [place] CV LAB;  Service: Cardiovascular;  Laterality: Bilateral;  . PERIPHERAL VASCULAR THROMBECTOMY Right 05/24/2017   Procedure: PERIPHERAL VASCULAR THROMBECTOMY;  Surgeon: [name], MD;  Location: [place] CV LAB;  Service: Cardiovascular;  Laterality: Right;    FAMILY HISTORY: Family History  Problem Relation Age of Onset  . Diabetes Mother   . Cancer Brother   . Cancer Maternal Aunt   . Stroke Maternal Uncle     ADVANCED DIRECTIVES (Y/N):  @ADVDIR @  HEALTH MAINTENANCE: Social History   Tobacco Use  . Smoking status: Never Smoker  . Smokeless tobacco: Never Used  Substance Use Topics  . Alcohol use: No  . Drug use: No     Colonoscopy:  PAP:  Bone density:  Lipid panel:  No Known Allergies  Current Facility-Administered Medications  Medication Dose Route Frequency Provider Last Rate Last Dose  . docusate sodium (COLACE) capsule 100 mg  100 mg Oral BID  PRN Vaughan Basta, MD      . heparin ADULT infusion 100 units/mL (25000 units/224mL sodium chloride 0.45%)  1,350 Units/hr Intravenous Continuous Darel Hong, MD 13.5 mL/hr at 06/19/17 0553 1,350 Units/hr at 06/19/17 0553  . insulin aspart (novoLOG) injection 0-9 Units  0-9  Units Subcutaneous TID WC Vaughan Basta, MD      . metFORMIN (GLUCOPHAGE-XR) 24 hr tablet 750 mg  750 mg Oral Daily Vaughan Basta, MD   750 mg at 06/19/17 0914  . methocarbamol (ROBAXIN) tablet 500 mg  500 mg Oral Q6H PRN Vaughan Basta, MD      . morphine 2 MG/ML injection 2 mg  2 mg Intravenous Q4H PRN Vaughan Basta, MD   2 mg at 06/19/17 0919    OBJECTIVE: Vitals:   06/19/17 0548 06/19/17 0755  BP: 118/75 115/64  Pulse: (!) 55 61  Resp: 18 18  Temp: 97.8 F (36.6 C)   SpO2: 97% 98%     Body mass index is 32 kg/m.    ECOG FS:1 - Symptomatic but completely ambulatory  General: Well-developed, well-nourished, no acute distress. Eyes: Pink conjunctiva, anicteric sclera. HEENT: Normocephalic, moist mucous membranes, clear oropharnyx. Lungs: Clear to auscultation bilaterally. Heart: Regular rate and rhythm. No rubs, murmurs, or gallops. Abdomen: Soft, nontender, nondistended. No organomegaly noted, normoactive bowel sounds. Musculoskeletal: Right leg significantly swollen and erythematous.   Neuro: Alert, answering all questions appropriately. Cranial nerves grossly intact. Skin: No rashes or petechiae noted. Psych: Normal affect. Lymphatics: No cervical, calvicular, axillary or inguinal LAD.   LAB RESULTS:  Lab Results  Component Value Date   NA 140 06/19/2017   K 3.5 06/19/2017   CL 106 06/19/2017   CO2 27 06/19/2017   GLUCOSE 145 (H) 06/19/2017   BUN 13 06/19/2017   CREATININE 0.97 06/19/2017   CALCIUM 8.3 (L) 06/19/2017   PROT 6.9 06/18/2017   ALBUMIN 4.0 06/18/2017   AST 20 06/18/2017   ALT 19 06/18/2017   ALKPHOS 60 06/18/2017   BILITOT 1.5 (H) 06/18/2017   GFRNONAA >60 06/19/2017   GFRAA >60 06/19/2017    Lab Results  Component Value Date   WBC 63.4 (HH) 06/19/2017   NEUTROABS 5.6 06/18/2017   HGB 10.6 (L) 06/19/2017   HCT 33.7 (L) 06/19/2017   MCV 90.1 06/19/2017   PLT 100 (L) 06/19/2017     STUDIES: Dg Chest 2  View  Result Date: 05/23/2017 CLINICAL DATA:  Fever EXAM: CHEST  2 VIEW COMPARISON:  Chest CT 10/29/2016 FINDINGS: Patchy opacity in the right lower lung could reflect atelectasis or pneumonia. Left lung clear. Heart is normal size. No effusions or acute bony abnormality. IMPRESSION: Right lower lung airspace opacity, atelectasis versus pneumonia. Electronically Signed   By: Rolm Baptise M.D.   On: 05/23/2017 09:42   US Venous Img Lower Unilateral Right  Result Date: 06/18/2017 CLINICAL DATA:  Right lower extremity swelling. History of DVT. History of thrombectomy and IVC filter placement. EXAM: RIGHT LOWER EXTREMITY VENOUS DOPPLER ULTRASOUND TECHNIQUE: Gray-scale sonography with graded compression, as well as color Doppler and duplex ultrasound were performed to evaluate the lower extremity deep venous systems from the level of the common femoral vein and including the common femoral, femoral, profunda femoral, popliteal and calf veins including the posterior tibial, peroneal and gastrocnemius veins when visible. The superficial great saphenous vein was also interrogated. Spectral Doppler was utilized to evaluate flow at rest and with distal augmentation maneuvers in the common femoral, femoral and popliteal veins. COMPARISON:  05/19/2017  FINDINGS: Contralateral Common Femoral Vein: Normal compressibility, color Doppler flow and phasicity in the left common femoral vein. Common Femoral Vein: Partial compressibility of the right common femoral vein with echogenic thrombus. Saphenofemoral Junction: There appears to be nonocclusive thrombus at the saphenofemoral junction. Profunda Femoral Vein: Occluded with echogenic thrombus. Femoral Vein: Occluded with echogenic thrombus. Popliteal Vein: Limited evaluation but there is concern for nonocclusive thrombus. No significant compressibility in the popliteal vein. Calf Veins: Limited evaluation due to edema. Other Findings: Prominent lymph nodes in the right groin are  likely reactive. IMPRESSION: Positive for deep venous thrombosis in the right lower extremity. There is DVT in the right common femoral vein, profunda femoral vein and the right femoral vein. Probable thrombus within the right popliteal vein but limited evaluation. Limited evaluation of the right calf veins. Electronically Signed   By: Markus Daft M.D.   On: 06/18/2017 17:53    ASSESSMENT: Recurrent DVT, CLL.  PLAN:    1.  Recurrent DVT: Previously, full hypercoagulable workup was negative.  Patient had recurrent DVT approximately 1 month ago and was placed on Eliquis.  Despite this, he developed another DVT while on treatment.  Patient has an IVC filter in place.  Patient states he did not miss a single dose.  Patient now will require lifelong anticoagulation with Coumadin.  Agree with heparin drip and transition to oral Coumadin with goal INR of 2.0-3.0.  Patient has been instructed to follow-[facility] on Friday, June 25, 2017.  Will also consider referral to [facility] Huston Foley at [facility] for second opinion. 2.  CLL: Patient's white blood cell count appears to be approximately his baseline. Patient was noted to have a CD7 aberrant T cell antigen on peripheral blood flow cytometry which occasionally can be considered poor prognosis. CT scan results from October 2015 revealed only mild lymphadenopathy.  No intervention is needed at this time.  Unclear if treating his CLL would reduce his propensity to develop DVTs.  Will further discuss with Dr. Joan Flores.  If we elect to proceed with treatment, would likely reuse Rituxan weekly x4.  Appreciate consult, follow-up as above.  Call with questions.    [name], MD   06/19/2017 12:15 PM

## 2017-06-19 NOTE — Progress Notes (Signed)
[place] for heparin Indication: DVT  No Known Allergies  Patient Measurements: Height: 6\' 3"  (190.5 cm) Weight: 256 lb (116.1 kg) IBW/kg (Calculated) : 84.5 Heparin Dosing Weight: 108.8 kg  Vital Signs: Temp: 97.7 F (36.5 C) (03/09 0010) Temp Source: Oral (03/09 0010) BP: 120/81 (03/09 0010) Pulse Rate: 55 (03/09 0010)  Labs: Recent Labs    06/18/17 1649 06/19/17 0225  HGB 11.6* 10.6*  HCT 37.0* 33.7*  PLT 124* 100*  APTT 32  --   LABPROT 15.6*  --   INR 1.25  --   HEPARINUNFRC  --  1.44*  CREATININE 0.90 0.97    Estimated Creatinine Clearance: 112.6 mL/min (by C-G formula based on SCr of 0.97 mg/dL).   Medical History: Past Medical History:  Diagnosis Date  . [name]   . Cancer ([place])    CLL monitored   . Clotting disorder ([place])   . Diabetes mellitus without complication ([place])    Type II    Medications:  Patient takes Apixaban 5mg  BID at home. His last dose was 3/8 @ 1300.   Assessment: 62 yo male admitted with DVT. Patient has history of 3 blood clots in same leg requiring intervention to remove. His most recent was February. He has swelling and pain in his right knee and calf.   Goal of Therapy:  Heparin level 0.3-0.7 units/ml Monitor platelets by anticoagulation protocol: Yes   Plan:  Give 5000 units bolus x 1 Start heparin infusion at 1800 units/hr Check anti-Xa level in 6 hours and daily while on heparin Continue to monitor H&H and platelets  Gave patient bolus of 5000 units because he had a dose of apixaban 5 mg at 1300 and labs had not resulted at time of consult. Will order follow up labs. Pharmacy will continue to follow and adjust as needed.   3/9 0200 heparin level 1.44. Hold drip x 1 hour and restart at 1350 units/hr. Next heparin level 6 hours after restart.  Sim Boast, PharmD, BCPS  06/19/17 3:34 AM

## 2017-06-19 NOTE — Progress Notes (Addendum)
[place] for heparin & warfarin Indication: DVT  No Known Allergies  Patient Measurements: Height: 6\' 3"  (190.5 cm) Weight: 256 lb (116.1 kg) IBW/kg (Calculated) : 84.5 Heparin Dosing Weight: 108.8 kg  Vital Signs: Temp: 98.2 F (36.8 C) (03/09 1402) Temp Source: Oral (03/09 1402) BP: 117/64 (03/09 1402) Pulse Rate: 64 (03/09 1402)  Labs: Recent Labs    06/18/17 1649 06/19/17 0225 06/19/17 1019  HGB 11.6* 10.6*  --   HCT 37.0* 33.7*  --   PLT 124* 100*  --   APTT 32  --   --   LABPROT 15.6*  --   --   INR 1.25  --   --   HEPARINUNFRC  --  1.44* 0.54  CREATININE 0.90 0.97  --     Estimated Creatinine Clearance: 112.6 mL/min (by C-G formula based on SCr of 0.97 mg/dL).   Medical History: Past Medical History:  Diagnosis Date  . [name]   . Cancer ([place])    CLL monitored   . Clotting disorder ([place])   . Diabetes mellitus without complication ([place])    Type II    Medications:  Patient takes Apixaban 5mg  BID at home. His last dose was 3/8 @ 1300.   Assessment: 62 yo male admitted with DVT. Patient has history of 3 blood clots in same leg requiring intervention to remove. His most recent was February. He has swelling and pain in his right knee and calf.   DATE INR DOSE   Goal of Therapy:  Heparin level 0.3-0.7 units/ml Monitor platelets by anticoagulation protocol: Yes  INR goal of 2-3   Plan: Per oncology, plan is to transition patient to warfarin. Will give patient 7.5mg  of warfarin tonight and obtain PT INR in AM. Patient has INR goal of 2-3. Continue heparin for transition. Pharmacy will continue to follow heparin levels.   Lendon Ka, PharmD Pharmacy Resident 06/19/2017

## 2017-06-19 NOTE — Progress Notes (Signed)
[place] at [facility] NAME: Anthony Hart    MR#:  [id number]  DATE OF BIRTH:  1955-09-30  SUBJECTIVE:  CHIEF COMPLAINT:   Chief Complaint  Patient presents with  . Leg Pain    Came with recurrent leg swelling and DVT.  On heparine drip. No new complains.  REVIEW OF SYSTEMS:  CONSTITUTIONAL: No fever, fatigue or weakness.  EYES: No blurred or double vision.  EARS, NOSE, AND THROAT: No tinnitus or ear pain.  RESPIRATORY: No cough, shortness of breath, wheezing or hemoptysis.  CARDIOVASCULAR: No chest pain, orthopnea, edema.  GASTROINTESTINAL: No nausea, vomiting, diarrhea or abdominal pain.  GENITOURINARY: No dysuria, hematuria.  ENDOCRINE: No polyuria, nocturia,  HEMATOLOGY: No anemia, easy bruising or bleeding SKIN: No rash or lesion. MUSCULOSKELETAL: No joint pain or arthritis.  Swelling on right lower extremity. NEUROLOGIC: No tingling, numbness, weakness.  PSYCHIATRY: No anxiety or depression.   ROS  DRUG ALLERGIES:  No Known Allergies  VITALS:  Blood pressure 115/64, pulse 61, temperature 97.8 F (36.6 C), temperature source Oral, resp. rate 18, height 6\' 3"  (1.905 m), weight 116.1 kg (256 lb), SpO2 98 %.  PHYSICAL EXAMINATION:   GENERAL:  62 y.o.-year-old patient lying in the bed with no acute distress.  EYES: Pupils equal, round, reactive to light and accommodation. No scleral icterus. Extraocular muscles intact.  HEENT: Head atraumatic, normocephalic. Oropharynx and nasopharynx clear.  NECK:  Supple, no jugular venous distention. No thyroid enlargement, no tenderness.  LUNGS: Normal breath sounds bilaterally, no wheezing, rales,rhonchi or crepitation. No use of accessory muscles of respiration.  CARDIOVASCULAR: S1, S2 normal. No murmurs, rubs, or gallops.  ABDOMEN: Soft, nontender, nondistended. Bowel sounds present. No organomegaly or mass.  EXTREMITIES: No cyanosis, or clubbing. Right lower extremity has significant  swelling. NEUROLOGIC: Cranial nerves II through XII are intact. Muscle strength 5/5 in all extremities. Sensation intact. Gait not checked.  PSYCHIATRIC: The patient is alert and oriented x 3.  SKIN: No obvious rash, lesion, or ulcer.    Physical Exam LABORATORY PANEL:   CBC Recent Labs  Lab 06/19/17 0225  WBC 63.4*  HGB 10.6*  HCT 33.7*  PLT 100*   ------------------------------------------------------------------------------------------------------------------  Chemistries  Recent Labs  Lab 06/18/17 1649 06/19/17 0225  NA 138 140  K 4.4 3.5  CL 106 106  CO2 24 27  GLUCOSE 116* 145*  BUN 11 13  CREATININE 0.90 0.97  CALCIUM 8.9 8.3*  AST 20  --   ALT 19  --   ALKPHOS 60  --   BILITOT 1.5*  --    ------------------------------------------------------------------------------------------------------------------  Cardiac Enzymes No results for input(s): TROPONINI in the last 168 hours. ------------------------------------------------------------------------------------------------------------------  RADIOLOGY:  US Venous Img Lower Unilateral Right  Result Date: 06/18/2017 CLINICAL DATA:  Right lower extremity swelling. History of DVT. History of thrombectomy and IVC filter placement. EXAM: RIGHT LOWER EXTREMITY VENOUS DOPPLER ULTRASOUND TECHNIQUE: Gray-scale sonography with graded compression, as well as color Doppler and duplex ultrasound were performed to evaluate the lower extremity deep venous systems from the level of the common femoral vein and including the common femoral, femoral, profunda femoral, popliteal and calf veins including the posterior tibial, peroneal and gastrocnemius veins when visible. The superficial great saphenous vein was also interrogated. Spectral Doppler was utilized to evaluate flow at rest and with distal augmentation maneuvers in the common femoral, femoral and popliteal veins. COMPARISON:  05/19/2017 FINDINGS: Contralateral Common Femoral  Vein: Normal compressibility, color Doppler flow and phasicity  in the left common femoral vein. Common Femoral Vein: Partial compressibility of the right common femoral vein with echogenic thrombus. Saphenofemoral Junction: There appears to be nonocclusive thrombus at the saphenofemoral junction. Profunda Femoral Vein: Occluded with echogenic thrombus. Femoral Vein: Occluded with echogenic thrombus. Popliteal Vein: Limited evaluation but there is concern for nonocclusive thrombus. No significant compressibility in the popliteal vein. Calf Veins: Limited evaluation due to edema. Other Findings: Prominent lymph nodes in the right groin are likely reactive. IMPRESSION: Positive for deep venous thrombosis in the right lower extremity. There is DVT in the right common femoral vein, profunda femoral vein and the right femoral vein. Probable thrombus within the right popliteal vein but limited evaluation. Limited evaluation of the right calf veins. Electronically Signed   By: Markus Daft M.D.   On: 06/18/2017 17:53    ASSESSMENT AND PLAN:   Active Problems:   Leg DVT (deep venous thromboembolism), acute, bilateral (HCC)   DVT (deep venous thrombosis) (HCC)   * Recurrent DVT   status post IVC filter   IV heparin drip and transitioned to Coumadin oral and will need lifelong.   Appreciated helped by hematology.  * CLL   Hematology consult.   Following clinic.  * Diabetes   Continue metformin, keep on sliding scale coverage.     All the records are reviewed and case discussed with Care Management/Social Workerr. Management plans discussed with the patient, family and they are in agreement.  CODE STATUS: full.  TOTAL TIME TAKING CARE OF THIS PATIENT: 35 minutes.    POSSIBLE D/C IN 1-2 DAYS, DEPENDING ON CLINICAL CONDITION.   Vaughan Basta M.D on 06/19/2017   Between 7am to 6pm - Pager - [phone]  After 6pm go to [email or web address] - password [email or web address] [facility]   Office  [phone]  CC: Primary care physician; [name], MD  Note: This dictation was prepared with Dragon dictation along with smaller phrase technology. Any transcriptional errors that result from this process are unintentional.

## 2017-06-20 LAB — CBC
HEMATOCRIT: 35.3 % — AB (ref 40.0–52.0)
Hemoglobin: 11.2 g/dL — ABNORMAL LOW (ref 13.0–18.0)
MCH: 28.5 pg (ref 26.0–34.0)
MCHC: 31.7 g/dL — AB (ref 32.0–36.0)
MCV: 89.8 fL (ref 80.0–100.0)
Platelets: 109 10*3/uL — ABNORMAL LOW (ref 150–440)
RBC: 3.93 MIL/uL — ABNORMAL LOW (ref 4.40–5.90)
RDW: 17.2 % — AB (ref 11.5–14.5)
WBC: 64.4 10*3/uL (ref 3.8–10.6)

## 2017-06-20 LAB — PROTIME-INR
INR: 1.09
Prothrombin Time: 14 seconds (ref 11.4–15.2)

## 2017-06-20 LAB — GLUCOSE, CAPILLARY
GLUCOSE-CAPILLARY: 82 mg/dL (ref 65–99)
Glucose-Capillary: 118 mg/dL — ABNORMAL HIGH (ref 65–99)
Glucose-Capillary: 105 mg/dL — ABNORMAL HIGH (ref 65–99)

## 2017-06-20 LAB — HEPARIN LEVEL (UNFRACTIONATED)
HEPARIN UNFRACTIONATED: 0.2 [IU]/mL — AB (ref 0.30–0.70)
Heparin Unfractionated: 0.33 IU/mL (ref 0.30–0.70)

## 2017-06-20 MED ORDER — HEPARIN BOLUS VIA INFUSION
1600.0000 [IU] | Freq: Once | INTRAVENOUS | Status: AC
  Administered 2017-06-20: 1600 [IU] via INTRAVENOUS
  Filled 2017-06-20: qty 1600

## 2017-06-20 MED ORDER — WARFARIN SODIUM 7.5 MG PO TABS: 7.5000 mg | ORAL_TABLET | Freq: Every day | ORAL | 0 refills | Status: DC

## 2017-06-20 MED ORDER — ENOXAPARIN SODIUM 120 MG/0.8ML ~~LOC~~ SOLN
1.0000 mg/kg | Freq: Two times a day (BID) | SUBCUTANEOUS | Status: DC
  Administered 2017-06-20: 14:00:00 115 mg via SUBCUTANEOUS
  Filled 2017-06-20 (×3): qty 0.8

## 2017-06-20 MED ORDER — ENOXAPARIN SODIUM 120 MG/0.8ML ~~LOC~~ SOLN: 1.0000 mg/kg | Freq: Two times a day (BID) | SUBCUTANEOUS | 0 refills | Status: DC

## 2017-06-20 MED ORDER — WARFARIN SODIUM 7.5 MG PO TABS
7.5000 mg | ORAL_TABLET | Freq: Once | ORAL | Status: AC
  Administered 2017-06-20: 18:00:00 7.5 mg via ORAL
  Filled 2017-06-20: qty 1

## 2017-06-20 NOTE — Progress Notes (Addendum)
Received MD order to discharge patient to home, demonstrated Lovenox self administration with patient and teach back reviewed home meds, prescriptions and follow up appointments and patient verbalized understanding

## 2017-06-20 NOTE — Progress Notes (Signed)
[place] for heparin & warfarin Indication: DVT  No Known Allergies  Patient Measurements: Height: 6\' 3"  (190.5 cm) Weight: 256 lb (116.1 kg) IBW/kg (Calculated) : 84.5 Heparin Dosing Weight: 108.8 kg  Vital Signs: Temp: 98.4 F (36.9 C) (03/10 0513) Temp Source: Oral (03/10 0513) BP: 111/65 (03/10 0513) Pulse Rate: 61 (03/10 0513)  Labs: Recent Labs    06/18/17 1649  06/19/17 0225 06/19/17 1019 06/19/17 1859 06/20/17 0440  HGB 11.6*  --  10.6*  --   --  11.2*  HCT 37.0*  --  33.7*  --   --  35.3*  PLT 124*  --  100*  --   --  109*  APTT 32  --   --   --   --   --   LABPROT 15.6*  --   --   --   --  14.0  INR 1.25  --   --   --   --  1.09  HEPARINUNFRC  --    < > 1.44* 0.54 0.33 0.20*  CREATININE 0.90  --  0.97  --   --   --    < > = values in this interval not displayed.    Estimated Creatinine Clearance: 112.6 mL/min (by C-G formula based on SCr of 0.97 mg/dL).   Medical History: Past Medical History:  Diagnosis Date  . [name]   . Cancer ([place])    CLL monitored   . Clotting disorder ([place])   . Diabetes mellitus without complication ([place])    Type II    Medications:  Patient takes Apixaban 5mg  BID at home. His last dose was 3/8 @ 1300.   Assessment: 62 yo male admitted with DVT. Patient has history of 3 blood clots in same leg requiring intervention to remove. His most recent was February. He has swelling and pain in his right knee and calf.   DATE INR DOSE 3/8 1.25 3/9 None 7.5 mg 3/10 1.09  Goal of Therapy:  Heparin level 0.3-0.7 units/ml Monitor platelets by anticoagulation protocol: Yes  INR goal of 2-3   Plan: Warfarin: Per oncology, plan is to transition patient to warfarin. Will give patient 7.5mg  of warfarin again tonight and obtain PT INR in AM. Patient has INR goal of 2-3. Continue heparin for transition. Pharmacy will continue to follow heparin levels.   Heparin: 3/9 0200 heparin level  1.44. Hold drip x 1 hour and restart at 1350 units/hr. Next heparin level 6 hours after restart.  3/9  HL@ 1019= 0.54. Hgb 10.6, plt 100. Will continue current rate and check confirmatory level in 6 hours.  3/10 AM heparin level 0.20. 1600 unit bolus and increase rate to 1550 units/hr. Recheck in 6 hours   Chinita Greenland PharmD Clinical Pharmacist 06/20/2017

## 2017-06-20 NOTE — Progress Notes (Signed)
CRITICAL VALUE ALERT  Critical Value: WBC 64.4  Date & Time Notied:  07:00  Provider Notified: Marcille Blanco  Orders Received/Actions taken: No new orders

## 2017-06-20 NOTE — Progress Notes (Signed)
Please note patient requested discharge instructions and exit care to be printed in Anthony Hart not in [name]

## 2017-06-20 NOTE — Progress Notes (Signed)
[place] for heparin & warfarin Indication: DVT  No Known Allergies  Patient Measurements: Height: 6\' 3"  (190.5 cm) Weight: 256 lb (116.1 kg) IBW/kg (Calculated) : 84.5 Heparin Dosing Weight: 108.8 kg  Vital Signs: Temp: 98.3 F (36.8 C) (03/09 2020) Temp Source: Oral (03/09 2020) BP: 119/64 (03/09 2020) Pulse Rate: 66 (03/09 2020)  Labs: Recent Labs    06/18/17 1649  06/19/17 0225 06/19/17 1019 06/19/17 1859 06/20/17 0440  HGB 11.6*  --  10.6*  --   --  11.2*  HCT 37.0*  --  33.7*  --   --  35.3*  PLT 124*  --  100*  --   --  109*  APTT 32  --   --   --   --   --   LABPROT 15.6*  --   --   --   --  14.0  INR 1.25  --   --   --   --  1.09  HEPARINUNFRC  --    < > 1.44* 0.54 0.33 0.20*  CREATININE 0.90  --  0.97  --   --   --    < > = values in this interval not displayed.    Estimated Creatinine Clearance: 112.6 mL/min (by C-G formula based on SCr of 0.97 mg/dL).   Medical History: Past Medical History:  Diagnosis Date  . [name]   . Cancer ([place])    CLL monitored   . Clotting disorder ([place])   . Diabetes mellitus without complication ([place])    Type II    Medications:  Patient takes Apixaban 5mg  BID at home. His last dose was 3/8 @ 1300.   Assessment: 62 yo male admitted with DVT. Patient has history of 3 blood clots in same leg requiring intervention to remove. His most recent was February. He has swelling and pain in his right knee and calf.   DATE INR DOSE 3/9 1.25 7.5 3.10 1.09 7.5   Goal of Therapy:  Heparin level 0.3-0.7 units/ml Monitor platelets by anticoagulation protocol: Yes  INR goal of 2-3   Plan: Will continue heparin infusion at current rate and f/u AM labs.   3/10 AM heparin level 0.20. 1600 unit bolus and increase rate to 1550 units/hr. Recheck in 6 hours.  Ulice Dash, PharmD Clinical Pharmacist  06/20/2017

## 2017-06-20 NOTE — Care Management Note (Signed)
Case Management Note  Patient Details  Name: Anthony Hart. MRN: [id number] Date of Birth: 11/03/1955  Subjective/Objective:      Anthony Hart was provided with discount coupons for Eliquis and Lovenox.               Action/Plan:   Expected Discharge Date:  06/20/17               Expected Discharge Plan:     In-House Referral:     Discharge planning Services     Post Acute Care Choice:    Choice offered to:     DME Arranged:    DME Agency:     HH Arranged:    HH Agency:     Status of Service:     If discussed at [employer], dates discussed:    Additional Comments:  Alia Parsley A, RN 06/20/2017, 1:19 PM

## 2017-06-20 NOTE — Discharge Summary (Signed)
[place] [facility] [place] NAME: Anthony Hart    MR#:  [id number]  DATE OF BIRTH:  June 10, 1955  DATE OF ADMISSION:  06/18/2017 ADMITTING PHYSICIAN: Vaughan Basta, MD  DATE OF DISCHARGE: 06/20/2017   PRIMARY CARE PHYSICIAN: [name], MD    ADMISSION DIAGNOSIS:  Acute deep vein thrombosis (DVT) of femoral vein of right lower extremity ([place]) [I82.411]  DISCHARGE DIAGNOSIS:  Active Problems:   Leg DVT (deep venous thromboembolism), acute, bilateral (HCC)   DVT (deep venous thrombosis) ([place])   SECONDARY DIAGNOSIS:   Past Medical History:  Diagnosis Date  . [name]   . Cancer ([place])    CLL monitored   . Clotting disorder ([place])   . Diabetes mellitus without complication ([place])    Type II    HOSPITAL COURSE:   *Recurrent DVT status post IVC filter   IV heparin drip and transitioned to Coumadin oral and will need lifelong.   Appreciated helped by hematology.   Hematology suggested to continue Coumadin for life.   Patient feels much better today, we will discharge him with subcutaneous Lovenox injections and oral Coumadin and advised to follow with cancer Center in next 5 days.  *CLL Hematology consult.   Following in clinic.  *Diabetes Continue metformin, keep on sliding scale coverage.    DISCHARGE CONDITIONS:   Stable.  CONSULTS OBTAINED:  Treatment Team:  [name], MD  DRUG ALLERGIES:  No Known Allergies  DISCHARGE MEDICATIONS:   Allergies as of 06/20/2017   No Known Allergies     Medication List    STOP taking these medications   apixaban 5 MG Tabs tablet Commonly known as:  ELIQUIS     TAKE these medications   enoxaparin 120 MG/0.8ML injection Commonly known as:  LOVENOX Inject 0.77 mLs (115 mg total) into the skin every 12 (twelve) hours.   metFORMIN 750 MG 24 hr tablet Commonly known as:  GLUCOPHAGE-XR Take 750 mg by mouth daily.    methocarbamol 500 MG tablet Commonly known as:  ROBAXIN Take 1 tablet (500 mg total) by mouth every 6 (six) hours as needed for muscle spasms.   warfarin 7.5 MG tablet Commonly known as:  COUMADIN Take 1 tablet (7.5 mg total) by mouth daily at 6 PM.        DISCHARGE INSTRUCTIONS:    Follow with cancer center in 5 days.  If you experience worsening of your admission symptoms, develop shortness of breath, life threatening emergency, suicidal or homicidal thoughts you must seek medical attention immediately by calling 911 or calling your MD immediately  if symptoms less severe.  You Must read complete instructions/literature along with all the possible adverse reactions/side effects for all the Medicines you take and that have been prescribed to you. Take any new Medicines after you have completely understood and accept all the possible adverse reactions/side effects.   Please note  You were cared for by a hospitalist during your hospital stay. If you have any questions about your discharge medications or the care you received while you were in the hospital after you are discharged, you can call the unit and asked to speak with the hospitalist on call if the hospitalist that took care of you is not available. Once you are discharged, your primary care physician will handle any further medical issues. Please note that NO REFILLS for any discharge medications will be authorized once you are discharged, as it is imperative that  you return to your primary care physician (or establish a relationship with a primary care physician if you do not have one) for your aftercare needs so that they can reassess your need for medications and monitor your lab values.    Today   CHIEF COMPLAINT:   Chief Complaint  Patient presents with  . Leg Pain    HISTORY OF PRESENT ILLNESS:  Anthony Hart  is a 62 y.o. male with a known history of CLL, diabetes, blood clots in vein repeatedly- status post IVC  filter, was taking warfarin after his second time DVT which she took almost 6 month and then hematologist suggested to stop that, after stopping it within few weeks he had DVT again and he was admitted last month for that. That was his third time DVT episode. He preferred to take Eliquis at that time, which he is taking regularly without missing any doses, for last 2 days he started noticing swelling on his right leg again and came to emergency room. Noted to have a DVT episode again. ER physician started on heparin IV drip because of his recurrent DVT episodes. And given to hospitalist team for further management.   VITAL SIGNS:  Blood pressure 111/65, pulse 61, temperature 98.4 F (36.9 C), temperature source Oral, resp. rate 16, height 6\' 3"  (1.905 m), weight 116.1 kg (256 lb), SpO2 97 %.  I/O:    Intake/Output Summary (Last 24 hours) at 06/20/2017 1417 Last data filed at 06/20/2017 0700 Gross per 24 hour  Intake -  Output 2000 ml  Net -2000 ml    PHYSICAL EXAMINATION:   GENERAL:62 y.o.-year-old patient lying in the bed with no acute distress.  EYES: Pupils equal, round, reactive to light and accommodation. No scleral icterus. Extraocular muscles intact.  HEENT: Head atraumatic, normocephalic. Oropharynx and nasopharynx clear.  NECK: Supple, no jugular venous distention. No thyroid enlargement, no tenderness.  LUNGS: Normal breath sounds bilaterally, no wheezing, rales,rhonchi or crepitation. No use of accessory muscles of respiration.  CARDIOVASCULAR: S1, S2 normal. No murmurs, rubs, or gallops.  ABDOMEN: Soft, nontender, nondistended. Bowel sounds present. No organomegaly or mass.  EXTREMITIES:Nocyanosis, or clubbing. Right lower extremity has significant swelling. NEUROLOGIC: Cranial nerves II through XII are intact. Muscle strength 5/5 in all extremities. Sensation intact. Gait not checked.  PSYCHIATRIC: The patient is alert and oriented x 3.  SKIN: No obvious rash, lesion,  or ulcer.      DATA REVIEW:   CBC Recent Labs  Lab 06/20/17 0440  WBC 64.4*  HGB 11.2*  HCT 35.3*  PLT 109*    Chemistries  Recent Labs  Lab 06/18/17 1649 06/19/17 0225  NA 138 140  K 4.4 3.5  CL 106 106  CO2 24 27  GLUCOSE 116* 145*  BUN 11 13  CREATININE 0.90 0.97  CALCIUM 8.9 8.3*  AST 20  --   ALT 19  --   ALKPHOS 60  --   BILITOT 1.5*  --     Cardiac Enzymes No results for input(s): TROPONINI in the last 168 hours.  Microbiology Results  Results for orders placed or performed during the hospital encounter of 05/19/17  CULTURE, BLOOD (ROUTINE X 2) w Reflex to ID Panel     Status: None   Collection Time: 05/23/17  9:47 AM  Result Value Ref Range Status   Specimen Description BLOOD LT [facility]  Final   Special Requests   Final    BOTTLES DRAWN AEROBIC AND ANAEROBIC Blood Culture adequate volume  Culture   Final    NO GROWTH 5 DAYS Performed at [facility], [address] [place], [place] [address]    Report Status 05/28/2017 FINAL  Final  CULTURE, BLOOD (ROUTINE X 2) w Reflex to ID Panel     Status: None   Collection Time: 05/23/17 10:05 AM  Result Value Ref Range Status   Specimen Description BLOOD LT HAND  Final   Special Requests   Final    BOTTLES DRAWN AEROBIC AND ANAEROBIC Blood Culture adequate volume   Culture   Final    NO GROWTH 5 DAYS Performed at [facility], [address] [place], [place] [address]    Report Status 05/28/2017 FINAL  Final  Surgical pcr screen     Status: None   Collection Time: 05/24/17  6:42 AM  Result Value Ref Range Status   MRSA, PCR NEGATIVE NEGATIVE Final   Staphylococcus aureus NEGATIVE NEGATIVE Final    Comment: (NOTE) The Xpert SA Assay (FDA approved for NASAL specimens in patients 47 years of age and older), is one component of a comprehensive surveillance program. It is not intended to diagnose infection nor to guide or monitor treatment. Performed at [facility], [address], [place], [place] [address]     RADIOLOGY:  US Venous Img Lower Unilateral Right  Result Date: 06/18/2017 CLINICAL DATA:  Right lower extremity swelling. History of DVT. History of thrombectomy and IVC filter placement. EXAM: RIGHT LOWER EXTREMITY VENOUS DOPPLER ULTRASOUND TECHNIQUE: Gray-scale sonography with graded compression, as well as color Doppler and duplex ultrasound were performed to evaluate the lower extremity deep venous systems from the level of the common femoral vein and including the common femoral, femoral, profunda femoral, popliteal and calf veins including the posterior tibial, peroneal and gastrocnemius veins when visible. The superficial great saphenous vein was also interrogated. Spectral Doppler was utilized to evaluate flow at rest and with distal augmentation maneuvers in the common femoral, femoral and popliteal veins. COMPARISON:  05/19/2017 FINDINGS: Contralateral Common Femoral Vein: Normal compressibility, color Doppler flow and phasicity in the left common femoral vein. Common Femoral Vein: Partial compressibility of the right common femoral vein with echogenic thrombus. Saphenofemoral Junction: There appears to be nonocclusive thrombus at the saphenofemoral junction. Profunda Femoral Vein: Occluded with echogenic thrombus. Femoral Vein: Occluded with echogenic thrombus. Popliteal Vein: Limited evaluation but there is concern for nonocclusive thrombus. No significant compressibility in the popliteal vein. Calf Veins: Limited evaluation due to edema. Other Findings: Prominent lymph nodes in the right groin are likely reactive. IMPRESSION: Positive for deep venous thrombosis in the right lower extremity. There is DVT in the right common femoral vein, profunda femoral vein and the right femoral vein. Probable thrombus within the right popliteal vein but limited evaluation. Limited evaluation of the right calf veins. Electronically Signed   By: Markus Daft M.D.   On:  06/18/2017 17:53    EKG:   Orders placed or performed during the hospital encounter of 05/19/17  . ED EKG  . ED EKG      Management plans discussed with the patient, family and they are in agreement.  CODE STATUS:     Code Status Orders  (From admission, onward)        Start     Ordered   06/19/17 0003  Full code  Continuous     06/19/17 0002    Code Status History    Date Active Date Inactive Code Status Order ID Comments  User Context   05/20/2017 00:47 05/25/2017 17:14 Full Code [id number]  Lance Coon, MD Inpatient   10/29/2016 02:11 11/04/2016 19:43 Full Code [id number]  Harvie Bridge, DO ED   06/30/2016 23:35 07/03/2016 19:43 Full Code [id number]  [name], MD Inpatient      TOTAL TIME TAKING CARE OF THIS PATIENT: 35 minutes.    Vaughan Basta M.D on 06/20/2017 at 2:17 PM  Between 7am to 6pm - Pager - [phone]  After 6pm go to [email or web address] - password [email or web address] [place] [facility]  Office  [phone]  CC: Primary care physician; [name], MD   Note: This dictation was prepared with Dragon dictation along with smaller phrase technology. Any transcriptional errors that result from this process are unintentional.

## 2017-06-20 NOTE — Progress Notes (Signed)
[place] for Enoxaparin & warfarin Indication: DVT  No Known Allergies  Patient Measurements: Height: 6\' 3"  (190.5 cm) Weight: 256 lb (116.1 kg) IBW/kg (Calculated) : 84.5 Heparin Dosing Weight: 108.8 kg  Vital Signs: Temp: 98.4 F (36.9 C) (03/10 0513) Temp Source: Oral (03/10 0513) BP: 111/65 (03/10 0513) Pulse Rate: 61 (03/10 0513)  Labs: Recent Labs    06/18/17 1649 06/19/17 0225  06/19/17 1859 06/20/17 0440 06/20/17 1210  HGB 11.6* 10.6*  --   --  11.2*  --   HCT 37.0* 33.7*  --   --  35.3*  --   PLT 124* 100*  --   --  109*  --   APTT 32  --   --   --   --   --   LABPROT 15.6*  --   --   --  14.0  --   INR 1.25  --   --   --  1.09  --   HEPARINUNFRC  --  1.44*   < > 0.33 0.20* 0.33  CREATININE 0.90 0.97  --   --   --   --    < > = values in this interval not displayed.    Estimated Creatinine Clearance: 112.6 mL/min (by C-G formula based on SCr of 0.97 mg/dL).   Medical History: Past Medical History:  Diagnosis Date  . [name]   . Cancer ([place])    CLL monitored   . Clotting disorder ([place])   . Diabetes mellitus without complication ([place])    Type II    Medications:  Patient takes Apixaban 5mg  BID at home. His last dose was 3/8 @ 1300.   Assessment: 62 yo male admitted with DVT. Patient has history of 3 blood clots in same leg requiring intervention to remove. His most recent was February. He has swelling and pain in his right knee and calf.   DATE INR DOSE 3/8 1.25 3/9 None 7.5 mg 3/10 1.09   INR goal of 2-3   Plan: Warfarin: Per oncology, plan is to transition patient to warfarin. Will give patient 7.5mg  of warfarin again tonight and obtain PT INR in AM. Patient has INR goal of 2-3. Continue heparin for transition. Pharmacy will continue to follow heparin levels.   Enoxaparin: 3/10 1300 Discontinue heparin drip. Start enoxaparin 1mg /kg (115mg ) every 12 hours 1 hour after heparin infusion has  been stopped.  Pernell Dupre, PharmD, BCPS Clinical Pharmacist 06/20/2017 12:53 PM

## 2017-06-21 LAB — GLUCOSE, CAPILLARY: Glucose-Capillary: 110 mg/dL — ABNORMAL HIGH (ref 65–99)

## 2017-06-22 ENCOUNTER — Other Ambulatory Visit: Payer: Self-pay | Admitting: *Deleted

## 2017-06-22 DIAGNOSIS — I82411 Acute embolism and thrombosis of right femoral vein: Secondary | ICD-10-CM

## 2017-06-25 ENCOUNTER — Inpatient Hospital Stay: Payer: Medicaid Other

## 2017-06-25 ENCOUNTER — Inpatient Hospital Stay: Payer: Medicaid Other | Admitting: Oncology

## 2017-06-27 NOTE — Progress Notes (Deleted)
[facility]  [phone] [phone]  ID: Anthony Hart. OB: 03-23-1956  MR#: [id number]  AOZ#:[id number]  Patient Care Team: [name], MD as PCP - General (Family Medicine)  CHIEF COMPLAINT: CLL, Rai stage 0, right femoral DVT.  INTERVAL HISTORY: Patient returns to clinic today for repeat laboratory work and further evaluation. He continues to feel well and is asymptomatic. He has no neurologic complaints. He denies any fevers or night sweats. He has a good appetite and denies weight loss. He denies any chest pain or shortness of breath. He denies any nausea, vomiting, constipation, or diarrhea. He has no melena or hematochezia. He has no urinary complaints. Patient offers no specific complaints today.  REVIEW OF SYSTEMS:   Review of Systems  Constitutional: Negative.  Negative for fever, malaise/fatigue and weight loss.  Respiratory: Negative.  Negative for cough and shortness of breath.   Cardiovascular: Negative.  Negative for chest pain and leg swelling.  Gastrointestinal: Negative.  Negative for abdominal pain.  Genitourinary: Negative.   Musculoskeletal: Negative.   Skin: Negative.  Negative for rash.  Neurological: Negative.  Negative for sensory change and weakness.  Psychiatric/Behavioral: Negative.  The patient is not nervous/anxious.     As per HPI. Otherwise, a complete review of systems is negative.  PAST MEDICAL HISTORY: Past Medical History:  Diagnosis Date  . [name]   . Cancer ([place])    CLL monitored   . Clotting disorder ([place])   . Diabetes mellitus without complication ([place])    Type II    PAST SURGICAL HISTORY: Past Surgical History:  Procedure Laterality Date  . APPENDECTOMY    . COLONOSCOPY WITH PROPOFOL  02/04/2015   Procedure: COLONOSCOPY WITH PROPOFOL;  Surgeon: [name], MD;  Location: [facility] ENDOSCOPY;  Service: Gastroenterology;;  . ESOPHAGOGASTRODUODENOSCOPY (EGD) WITH PROPOFOL N/A  02/04/2015   Procedure: ESOPHAGOGASTRODUODENOSCOPY (EGD) WITH PROPOFOL;  Surgeon: [name], MD;  Location: [facility] ENDOSCOPY;  Service: Gastroenterology;  Laterality: N/A;  . PERIPHERAL VASCULAR THROMBECTOMY Right 07/02/2016   Procedure: Peripheral Vascular Thrombectomy and IVC filter;  Surgeon: [name], MD;  Location: [place] CV LAB;  Service: Cardiovascular;  Laterality: Right;  . PERIPHERAL VASCULAR THROMBECTOMY Bilateral 10/30/2016   Procedure: Peripheral Vascular Thrombectomy;  Surgeon: [name], MD;  Location: [place] CV LAB;  Service: Cardiovascular;  Laterality: Bilateral;  . PERIPHERAL VASCULAR THROMBECTOMY Right 05/24/2017   Procedure: PERIPHERAL VASCULAR THROMBECTOMY;  Surgeon: [name], MD;  Location: [place] CV LAB;  Service: Cardiovascular;  Laterality: Right;    FAMILY HISTORY Family History  Problem Relation Age of Onset  . Diabetes Mother   . Cancer Brother   . Cancer Maternal Aunt   . Stroke Maternal Uncle        ADVANCED DIRECTIVES:    HEALTH MAINTENANCE: Social History   Tobacco Use  . Smoking status: Never Smoker  . Smokeless tobacco: Never Used  Substance Use Topics  . Alcohol use: No  . Drug use: No     Colonoscopy:  PAP:  Bone density:  Lipid panel:  No Known Allergies  Current Outpatient Medications  Medication Sig Dispense Refill  . enoxaparin (LOVENOX) 120 MG/0.8ML injection Inject 0.77 mLs (115 mg total) into the skin every 12 (twelve) hours. 12 Syringe 0  . metFORMIN (GLUCOPHAGE-XR) 750 MG 24 hr tablet Take 750 mg by mouth daily.    . methocarbamol (ROBAXIN) 500 MG tablet Take 1 tablet (500 mg total)  by mouth every 6 (six) hours as needed for muscle spasms. (Patient not taking: Reported on 05/19/2017) 12 tablet 0  . warfarin (COUMADIN) 7.5 MG tablet Take 1 tablet (7.5 mg total) by mouth daily at 6 PM. 15 tablet 0   No current facility-administered medications for this visit.     OBJECTIVE: There were no vitals  filed for this visit.   There is no height or weight on file to calculate BMI.    ECOG FS:0 - Asymptomatic  General: Well-developed, well-nourished, no acute distress. Eyes: Pink conjunctiva, anicteric sclera. Lungs: Clear to auscultation bilaterally. Heart: Regular rate and rhythm. No rubs, murmurs, or gallops. Abdomen: Soft, nontender, nondistended. No organomegaly noted, normoactive bowel sounds. Musculoskeletal: No edema, cyanosis, or clubbing. Neuro: Alert, answering all questions appropriately. Cranial nerves grossly intact. Skin: No rashes or petechiae noted. Psych: Normal affect. Lymphatics: No cervical, calvicular, axillary or inguinal LAD.   LAB RESULTS:  Lab Results  Component Value Date   NA 140 06/19/2017   K 3.5 06/19/2017   CL 106 06/19/2017   CO2 27 06/19/2017   GLUCOSE 145 (H) 06/19/2017   BUN 13 06/19/2017   CREATININE 0.97 06/19/2017   CALCIUM 8.3 (L) 06/19/2017   PROT 6.9 06/18/2017   ALBUMIN 4.0 06/18/2017   AST 20 06/18/2017   ALT 19 06/18/2017   ALKPHOS 60 06/18/2017   BILITOT 1.5 (H) 06/18/2017   GFRNONAA >60 06/19/2017   GFRAA >60 06/19/2017    Lab Results  Component Value Date   WBC 64.4 (HH) 06/20/2017   NEUTROABS 5.6 06/18/2017   HGB 11.2 (L) 06/20/2017   HCT 35.3 (L) 06/20/2017   MCV 89.8 06/20/2017   PLT 109 (L) 06/20/2017     STUDIES: US Venous Img Lower Unilateral Right  Result Date: 06/18/2017 CLINICAL DATA:  Right lower extremity swelling. History of DVT. History of thrombectomy and IVC filter placement. EXAM: RIGHT LOWER EXTREMITY VENOUS DOPPLER ULTRASOUND TECHNIQUE: Gray-scale sonography with graded compression, as well as color Doppler and duplex ultrasound were performed to evaluate the lower extremity deep venous systems from the level of the common femoral vein and including the common femoral, femoral, profunda femoral, popliteal and calf veins including the posterior tibial, peroneal and gastrocnemius veins when visible. The  superficial great saphenous vein was also interrogated. Spectral Doppler was utilized to evaluate flow at rest and with distal augmentation maneuvers in the common femoral, femoral and popliteal veins. COMPARISON:  05/19/2017 FINDINGS: Contralateral Common Femoral Vein: Normal compressibility, color Doppler flow and phasicity in the left common femoral vein. Common Femoral Vein: Partial compressibility of the right common femoral vein with echogenic thrombus. Saphenofemoral Junction: There appears to be nonocclusive thrombus at the saphenofemoral junction. Profunda Femoral Vein: Occluded with echogenic thrombus. Femoral Vein: Occluded with echogenic thrombus. Popliteal Vein: Limited evaluation but there is concern for nonocclusive thrombus. No significant compressibility in the popliteal vein. Calf Veins: Limited evaluation due to edema. Other Findings: Prominent lymph nodes in the right groin are likely reactive. IMPRESSION: Positive for deep venous thrombosis in the right lower extremity. There is DVT in the right common femoral vein, profunda femoral vein and the right femoral vein. Probable thrombus within the right popliteal vein but limited evaluation. Limited evaluation of the right calf veins. Electronically Signed   By: Markus Daft M.D.   On: 06/18/2017 17:53    ASSESSMENT: CLL, Rai stage 0, right femoral DVT  PLAN:    1. CLL: Patient's most recent white blood cell count remains elevated at  82.6, but this has been approximately his baseline since March 2018. Previously his baseline was approximately 60 since 2015.  Patient was noted to have a CD7 aberrant T cell antigen on peripheral blood flow cytometry which occasionally can be considered poor prognosis. CT scan results from October 2015 revealed only mild lymphadenopathy. This only needs to be repeated if there is concern of progression of disease and treatment is necessary.  Patient does not require treatment at this time. He does not require bone  marrow biopsy. Return to clinic in 6 months with repeat laboratory work and further evaluation.   2. 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 He has now discontinued driving. Full hypercoagulable workup was negative. His dRVVT was elevated, likely secondary to his anticoagulation. Patient has now completed greater than 6 months of anticoagulation and can discontinue treatment. He was educated on risk factors for developing another DVT as well as instructed that he is at higher risk for developing a second blood clot over the general population. Return to clinic as above.   Approximately 30 minutes was spent in discussion of which greater than 50% was consultation.  Patient expressed understanding and was in agreement with this plan. He also understands that He can call clinic at any time with any questions, concerns, or complaints.     [name], MD   06/27/2017 10:45 AM

## 2017-07-02 ENCOUNTER — Inpatient Hospital Stay: Payer: Medicaid Other | Attending: Oncology | Admitting: Oncology

## 2017-07-02 ENCOUNTER — Inpatient Hospital Stay: Payer: Medicaid Other

## 2017-07-07 ENCOUNTER — Other Ambulatory Visit: Payer: Self-pay | Admitting: *Deleted

## 2017-07-07 MED ORDER — WARFARIN SODIUM 7.5 MG PO TABS: 7.5000 mg | ORAL_TABLET | Freq: Every day | ORAL | 0 refills | Status: DC

## 2017-07-09 ENCOUNTER — Inpatient Hospital Stay: Payer: Medicaid Other | Attending: Oncology

## 2017-07-09 ENCOUNTER — Inpatient Hospital Stay: Payer: Medicaid Other | Admitting: Oncology

## 2017-07-16 ENCOUNTER — Ambulatory Visit: Payer: Medicaid Other | Admitting: Oncology

## 2017-07-16 ENCOUNTER — Other Ambulatory Visit: Payer: Medicaid Other

## 2017-07-23 ENCOUNTER — Inpatient Hospital Stay: Payer: Self-pay

## 2017-07-23 ENCOUNTER — Inpatient Hospital Stay: Payer: Medicaid Other | Attending: Oncology | Admitting: Oncology

## 2017-07-23 VITALS — BP 124/71 | HR 61 | Temp 97.0°F | Resp 20 | Wt 249.6 lb

## 2017-07-23 DIAGNOSIS — I82411 Acute embolism and thrombosis of right femoral vein: Secondary | ICD-10-CM

## 2017-07-23 DIAGNOSIS — C9110 Chronic lymphocytic leukemia of B-cell type not having achieved remission: Secondary | ICD-10-CM

## 2017-07-23 DIAGNOSIS — I82409 Acute embolism and thrombosis of unspecified deep veins of unspecified lower extremity: Secondary | ICD-10-CM

## 2017-07-23 DIAGNOSIS — Z7901 Long term (current) use of anticoagulants: Secondary | ICD-10-CM

## 2017-07-23 DIAGNOSIS — Z95828 Presence of other vascular implants and grafts: Secondary | ICD-10-CM

## 2017-07-23 LAB — PROTIME-INR
INR: 1.7
Prothrombin Time: 19.8 seconds — ABNORMAL HIGH (ref 11.4–15.2)

## 2017-07-23 NOTE — Progress Notes (Signed)
Patient denies any concerns today.  

## 2017-07-25 NOTE — Progress Notes (Signed)
[place] [facility]  [phone] [phone]  ID: Anthony Hart. OB: [birth year]  MR#: [id number]  OIN#:[id number]  Patient Care Team: [name], MD as PCP - General (Family Medicine)  CHIEF COMPLAINT: CLL, Rai stage 0, recurrent DVT.  INTERVAL HISTORY: Patient returns to clinic today for further evaluation and hospital follow-up.  Anthony Hart was admitted to the hospital approximately 1 month ago and diagnosed with recurrent DVT despite taking Eliquis.  Patient stated at that time that Anthony Hart was compliant and did not miss any doses.  Anthony Hart has missed several clinic appointments in the interim secondary to his work schedule. Anthony Hart currently feels well and is asymptomatic.  Anthony Hart is tolerating Coumadin without increased bleeding or bruising.  Anthony Hart has no neurologic complaints. Anthony Hart denies any fevers or night sweats. Anthony Hart has a good appetite and denies weight loss. Anthony Hart denies any chest pain or shortness of breath. Anthony Hart denies any nausea, vomiting, constipation, or diarrhea. Anthony Hart has no melena or hematochezia. Anthony Hart has no urinary complaints. Patient offers no specific complaints today.  REVIEW OF SYSTEMS:   Review of Systems  Constitutional: Negative.  Negative for fever, malaise/fatigue and weight loss.  Respiratory: Negative.  Negative for cough and shortness of breath.   Cardiovascular: Negative.  Negative for chest pain and leg swelling.  Gastrointestinal: Negative.  Negative for abdominal pain, blood in stool and melena.  Genitourinary: Negative.  Negative for hematuria.  Musculoskeletal: Negative.   Skin: Negative.  Negative for rash.  Neurological: Negative.  Negative for sensory change, focal weakness and weakness.  Endo/Heme/Allergies: Negative.  Does not bruise/bleed easily.  Psychiatric/Behavioral: Negative.  The patient is not nervous/anxious.     As per HPI. Otherwise, a complete review of systems is negative.  PAST MEDICAL HISTORY: Past Medical History:  Diagnosis  Date  . [name]   . Cancer ([place])    CLL monitored   . Clotting disorder ([place])   . Diabetes mellitus without complication ([place])    Type II    PAST SURGICAL HISTORY: Past Surgical History:  Procedure Laterality Date  . APPENDECTOMY    . COLONOSCOPY WITH PROPOFOL  02/04/2015   Procedure: COLONOSCOPY WITH PROPOFOL;  Surgeon: [name], MD;  Location: [facility] ENDOSCOPY;  Service: Gastroenterology;;  . ESOPHAGOGASTRODUODENOSCOPY (EGD) WITH PROPOFOL N/A 02/04/2015   Procedure: ESOPHAGOGASTRODUODENOSCOPY (EGD) WITH PROPOFOL;  Surgeon: [name], MD;  Location: [facility] ENDOSCOPY;  Service: Gastroenterology;  Laterality: N/A;  . PERIPHERAL VASCULAR THROMBECTOMY Right 07/02/2016   Procedure: Peripheral Vascular Thrombectomy and IVC filter;  Surgeon: [name], MD;  Location: [place] CV LAB;  Service: Cardiovascular;  Laterality: Right;  . PERIPHERAL VASCULAR THROMBECTOMY Bilateral 10/30/2016   Procedure: Peripheral Vascular Thrombectomy;  Surgeon: [name], MD;  Location: [place] CV LAB;  Service: Cardiovascular;  Laterality: Bilateral;  . PERIPHERAL VASCULAR THROMBECTOMY Right 05/24/2017   Procedure: PERIPHERAL VASCULAR THROMBECTOMY;  Surgeon: [name], MD;  Location: [place] CV LAB;  Service: Cardiovascular;  Laterality: Right;    FAMILY HISTORY Family History  Problem Relation Age of Onset  . Diabetes Mother   . Cancer Brother   . Cancer Maternal Aunt   . Stroke Maternal Uncle        ADVANCED DIRECTIVES:    HEALTH MAINTENANCE: Social History   Tobacco Use  . Smoking status: Never Smoker  . Smokeless tobacco: Never Used  Substance Use Topics  . Alcohol use: No  . Drug use: No  Colonoscopy:  PAP:  Bone density:  Lipid panel:  No Known Allergies  Current Outpatient Medications  Medication Sig Dispense Refill  . warfarin (COUMADIN) 7.5 MG tablet Take 1 tablet (7.5 mg total) by mouth daily at 6 PM. 30 tablet 0  . metFORMIN (GLUCOPHAGE-XR)  750 MG 24 hr tablet Take 750 mg by mouth daily.     No current facility-administered medications for this visit.     OBJECTIVE: Vitals:   07/23/17 1530  BP: 124/71  Pulse: 61  Resp: 20  Temp: (!) 97 F (36.1 C)     Body mass index is 31.19 kg/m.    ECOG FS:0 - Asymptomatic  General: Well-developed, well-nourished, no acute distress. Eyes: Pink conjunctiva, anicteric sclera. HEENT: Normocephalic, moist mucous membranes, clear oropharnyx. Lungs: Clear to auscultation bilaterally. Heart: Regular rate and rhythm. No rubs, murmurs, or gallops. Abdomen: Soft, nontender, nondistended. No organomegaly noted, normoactive bowel sounds. Musculoskeletal: No edema, cyanosis, or clubbing. Neuro: Alert, answering all questions appropriately. Cranial nerves grossly intact. Skin: No rashes or petechiae noted. Psych: Normal affect. Lymphatics: No cervical, calvicular, axillary or inguinal LAD.   LAB RESULTS:  Lab Results  Component Value Date   NA 140 06/19/2017   K 3.5 06/19/2017   CL 106 06/19/2017   CO2 27 06/19/2017   GLUCOSE 145 (H) 06/19/2017   BUN 13 06/19/2017   CREATININE 0.97 06/19/2017   CALCIUM 8.3 (L) 06/19/2017   PROT 6.9 06/18/2017   ALBUMIN 4.0 06/18/2017   AST 20 06/18/2017   ALT 19 06/18/2017   ALKPHOS 60 06/18/2017   BILITOT 1.5 (H) 06/18/2017   GFRNONAA >60 06/19/2017   GFRAA >60 06/19/2017    Lab Results  Component Value Date   WBC 64.4 (HH) 06/20/2017   NEUTROABS 5.6 06/18/2017   HGB 11.2 (L) 06/20/2017   HCT 35.3 (L) 06/20/2017   MCV 89.8 06/20/2017   PLT 109 (L) 06/20/2017     STUDIES: No results found.  ASSESSMENT: CLL, Rai stage 0, recurrent DVT  PLAN:    1.  Recurrent DVT: Previously, full hypercoagulable workup was negative.  Patient had recurrent DVT approximately 2 months ago and was placed on Eliquis.  Anthony Hart was not on anticoagulation at that time. Despite this, Anthony Hart developed another DVT while taking Eliquis Anthony Hart now has an IVC filter in  place.  Anthony Hart states Anthony Hart did not miss a single dose.  Anthony Hart was transitioned to Coumadin while in the hospital and once again states Anthony Hart has been compliant, although given the fact that Anthony Hart missed several clinics appointment this is unclear.  Have recommended lifelong anticoagulation.  INR is 1.7 today, but will keep his dose of 7.5 mg daily the same.  Return to clinic weekly for INR check and dose adjustment.  Patient will then return to clinic in 8 weeks for further evaluation.  Anthony Hart has also been given a referral to Dr. Huston Foley at [facility] for second opinion. 2.  CLL: Patient's white blood cell count appears to be approximately his baseline. Patient was noted to have a CD7 aberrant T cell antigen on peripheral blood flow cytometrywhich occasionally can be considered poor prognosis. CT scan results from October 2015 revealed only mild lymphadenopathy.  No intervention is needed at this time.  Unclear if treating his CLL would reduce his propensity to develop DVTs.  Will further discuss with Dr. Joan Flores.  If we elect to proceed with treatment, would likely reuse Rituxan weekly x4.  Approximately 30 minutes was spent in discussion of  which greater than 50% was consultation.  Patient expressed understanding and was in agreement with this plan. Anthony Hart also understands that Anthony Hart can call clinic at any time with any questions, concerns, or complaints.     [name], MD   07/25/2017 9:31 AM

## 2017-07-30 ENCOUNTER — Ambulatory Visit: Payer: Medicaid Other | Admitting: Oncology

## 2017-07-30 ENCOUNTER — Inpatient Hospital Stay: Payer: Self-pay

## 2017-07-30 ENCOUNTER — Other Ambulatory Visit: Payer: Self-pay

## 2017-07-30 ENCOUNTER — Other Ambulatory Visit: Payer: Medicaid Other

## 2017-07-30 DIAGNOSIS — I82411 Acute embolism and thrombosis of right femoral vein: Secondary | ICD-10-CM

## 2017-07-30 LAB — PROTIME-INR
INR: 1.57
Prothrombin Time: 18.6 seconds — ABNORMAL HIGH (ref 11.4–15.2)

## 2017-08-04 ENCOUNTER — Telehealth: Payer: Self-pay | Admitting: *Deleted

## 2017-08-04 ENCOUNTER — Other Ambulatory Visit: Payer: Self-pay | Admitting: *Deleted

## 2017-08-04 DIAGNOSIS — I82409 Acute embolism and thrombosis of unspecified deep veins of unspecified lower extremity: Secondary | ICD-10-CM

## 2017-08-04 NOTE — Telephone Encounter (Signed)
Per Dr. [name] patient to increase Coumadin to 10 mg daily. I have made several attempts to notify patient without success, unable to leave vm at this time.

## 2017-08-06 ENCOUNTER — Inpatient Hospital Stay: Payer: Self-pay

## 2017-08-06 ENCOUNTER — Ambulatory Visit (INDEPENDENT_AMBULATORY_CARE_PROVIDER_SITE_OTHER): Payer: Medicaid Other | Admitting: Vascular Surgery

## 2017-08-13 ENCOUNTER — Other Ambulatory Visit: Payer: Self-pay

## 2017-08-13 ENCOUNTER — Inpatient Hospital Stay: Payer: Medicaid Other | Attending: Oncology

## 2017-08-13 DIAGNOSIS — I82409 Acute embolism and thrombosis of unspecified deep veins of unspecified lower extremity: Secondary | ICD-10-CM | POA: Insufficient documentation

## 2017-08-13 DIAGNOSIS — Z7901 Long term (current) use of anticoagulants: Secondary | ICD-10-CM | POA: Insufficient documentation

## 2017-08-13 DIAGNOSIS — I82411 Acute embolism and thrombosis of right femoral vein: Secondary | ICD-10-CM

## 2017-08-13 LAB — PROTIME-INR
INR: 1.39
PROTHROMBIN TIME: 16.9 s — AB (ref 11.4–15.2)

## 2017-08-20 ENCOUNTER — Telehealth: Payer: Self-pay | Admitting: *Deleted

## 2017-08-20 ENCOUNTER — Inpatient Hospital Stay: Payer: Self-pay

## 2017-08-20 NOTE — Telephone Encounter (Signed)
NA

## 2017-08-27 ENCOUNTER — Inpatient Hospital Stay: Payer: Self-pay

## 2017-09-03 ENCOUNTER — Inpatient Hospital Stay: Payer: Self-pay

## 2017-09-10 ENCOUNTER — Inpatient Hospital Stay: Payer: Self-pay

## 2017-09-17 ENCOUNTER — Inpatient Hospital Stay: Payer: Medicaid Other | Attending: Oncology

## 2017-09-20 NOTE — Progress Notes (Deleted)
[place]  [phone] [phone]  ID: Anthony Hart. OB: 1955/12/09  MR#: [id number]  RSW#:[id number]  Patient Care Team: [name], MD as PCP - General (Family Medicine)  CHIEF COMPLAINT: CLL, Rai stage 0, recurrent DVT.  INTERVAL HISTORY: Patient returns to clinic today for further evaluation and hospital follow-up.  He was admitted to the hospital approximately 1 month ago and diagnosed with recurrent DVT despite taking Eliquis.  Patient stated at that time that he was compliant and did not miss any doses.  He has missed several clinic appointments in the interim secondary to his work schedule. He currently feels well and is asymptomatic.  He is tolerating Coumadin without increased bleeding or bruising.  He has no neurologic complaints. He denies any fevers or night sweats. He has a good appetite and denies weight loss. He denies any chest pain or shortness of breath. He denies any nausea, vomiting, constipation, or diarrhea. He has no melena or hematochezia. He has no urinary complaints. Patient offers no specific complaints today.  REVIEW OF SYSTEMS:   Review of Systems  Constitutional: Negative.  Negative for fever, malaise/fatigue and weight loss.  Respiratory: Negative.  Negative for cough and shortness of breath.   Cardiovascular: Negative.  Negative for chest pain and leg swelling.  Gastrointestinal: Negative.  Negative for abdominal pain, blood in stool and melena.  Genitourinary: Negative.  Negative for hematuria.  Musculoskeletal: Negative.   Skin: Negative.  Negative for rash.  Neurological: Negative.  Negative for sensory change, focal weakness and weakness.  Endo/Heme/Allergies: Negative.  Does not bruise/bleed easily.  Psychiatric/Behavioral: Negative.  The patient is not nervous/anxious.     As per HPI. Otherwise, a complete review of systems is negative.  PAST MEDICAL HISTORY: Past Medical History:  Diagnosis  Date  . [name]   . Cancer ([place])    CLL monitored   . Clotting disorder ([place])   . Diabetes mellitus without complication ([place])    Type II    PAST SURGICAL HISTORY: Past Surgical History:  Procedure Laterality Date  . APPENDECTOMY    . COLONOSCOPY WITH PROPOFOL  02/04/2015   Procedure: COLONOSCOPY WITH PROPOFOL;  Surgeon: [name], MD;  Location: [facility] ENDOSCOPY;  Service: Gastroenterology;;  . ESOPHAGOGASTRODUODENOSCOPY (EGD) WITH PROPOFOL N/A 02/04/2015   Procedure: ESOPHAGOGASTRODUODENOSCOPY (EGD) WITH PROPOFOL;  Surgeon: [name], MD;  Location[facility] ENDOSCOPY;  Service: Gastroenterology;  Laterality: N/A;  . PERIPHERAL VASCULAR THROMBECTOMY Right 07/02/2016   Procedure: Peripheral Vascular Thrombectomy and IVC filter;  Surgeon: [name], MD;  Location: [place] CV LAB;  Service: Cardiovascular;  Laterality: Right;  . PERIPHERAL VASCULAR THROMBECTOMY Bilateral 10/30/2016   Procedure: Peripheral Vascular Thrombectomy;  Surgeon: [name], MD;  Location: [place] CV LAB;  Service: Cardiovascular;  Laterality: Bilateral;  . PERIPHERAL VASCULAR THROMBECTOMY Right 05/24/2017   Procedure: PERIPHERAL VASCULAR THROMBECTOMY;  Surgeon: [name], MD;  Location: [place] CV LAB;  Service: Cardiovascular;  Laterality: Right;    FAMILY HISTORY Family History  Problem Relation Age of Onset  . Diabetes Mother   . Cancer Brother   . Cancer Maternal Aunt   . Stroke Maternal Uncle        ADVANCED DIRECTIVES:    HEALTH MAINTENANCE: Social History   Tobacco Use  . Smoking status: Never Smoker  . Smokeless tobacco: Never Used  Substance Use Topics  . Alcohol use: No  . Drug use: No  Colonoscopy:  PAP:  Bone density:  Lipid panel:  No Known Allergies  Current Outpatient Medications  Medication Sig Dispense Refill  . metFORMIN (GLUCOPHAGE-XR) 750 MG 24 hr tablet Take 750 mg by mouth daily.    Marland Kitchen warfarin (COUMADIN) 7.5 MG tablet Take 1 tablet  (7.5 mg total) by mouth daily at 6 PM. 30 tablet 0   No current facility-administered medications for this visit.     OBJECTIVE: There were no vitals filed for this visit.   There is no height or weight on file to calculate BMI.    ECOG FS:0 - Asymptomatic  General: Well-developed, well-nourished, no acute distress. Eyes: Pink conjunctiva, anicteric sclera. HEENT: Normocephalic, moist mucous membranes, clear oropharnyx. Lungs: Clear to auscultation bilaterally. Heart: Regular rate and rhythm. No rubs, murmurs, or gallops. Abdomen: Soft, nontender, nondistended. No organomegaly noted, normoactive bowel sounds. Musculoskeletal: No edema, cyanosis, or clubbing. Neuro: Alert, answering all questions appropriately. Cranial nerves grossly intact. Skin: No rashes or petechiae noted. Psych: Normal affect. Lymphatics: No cervical, calvicular, axillary or inguinal LAD.   LAB RESULTS:  Lab Results  Component Value Date   NA 140 06/19/2017   K 3.5 06/19/2017   CL 106 06/19/2017   CO2 27 06/19/2017   GLUCOSE 145 (H) 06/19/2017   BUN 13 06/19/2017   CREATININE 0.97 06/19/2017   CALCIUM 8.3 (L) 06/19/2017   PROT 6.9 06/18/2017   ALBUMIN 4.0 06/18/2017   AST 20 06/18/2017   ALT 19 06/18/2017   ALKPHOS 60 06/18/2017   BILITOT 1.5 (H) 06/18/2017   GFRNONAA >60 06/19/2017   GFRAA >60 06/19/2017    Lab Results  Component Value Date   WBC 64.4 (HH) 06/20/2017   NEUTROABS 5.6 06/18/2017   HGB 11.2 (L) 06/20/2017   HCT 35.3 (L) 06/20/2017   MCV 89.8 06/20/2017   PLT 109 (L) 06/20/2017     STUDIES: No results found.  ASSESSMENT: CLL, Rai stage 0, recurrent DVT  PLAN:    1.  Recurrent DVT: Previously, full hypercoagulable workup was negative.  Patient had recurrent DVT approximately 2 months ago and was placed on Eliquis.  He was not on anticoagulation at that time. Despite this, he developed another DVT while taking Eliquis he now has an IVC filter in place.  He states he did not  miss a single dose.  He was transitioned to Coumadin while in the hospital and once again states he has been compliant, although given the fact that he missed several clinics appointment this is unclear.  Have recommended lifelong anticoagulation.  INR is 1.7 today, but will keep his dose of 7.5 mg daily the same.  Return to clinic weekly for INR check and dose adjustment.  Patient will then return to clinic in 8 weeks for further evaluation.  He has also been given a referral to Dr. Huston Foley at [facility] for second opinion. 2.  CLL: Patient's white blood cell count appears to be approximately his baseline. Patient was noted to have a CD7 aberrant T cell antigen on peripheral blood flow cytometrywhich occasionally can be considered poor prognosis. CT scan results from October 2015 revealed only mild lymphadenopathy.  No intervention is needed at this time.  Unclear if treating his CLL would reduce his propensity to develop DVTs.  Will further discuss with Dr. Joan Flores.  If we elect to proceed with treatment, would likely reuse Rituxan weekly x4.  Approximately 30 minutes was spent in discussion of which greater than 50% was consultation.  Patient expressed understanding  and was in agreement with this plan. He also understands that He can call clinic at any time with any questions, concerns, or complaints.     [name], MD   09/20/2017 12:01 AM

## 2017-09-24 ENCOUNTER — Other Ambulatory Visit: Payer: Medicaid Other

## 2017-09-24 ENCOUNTER — Ambulatory Visit: Payer: Medicaid Other | Admitting: Oncology

## 2017-12-27 ENCOUNTER — Encounter: Payer: Self-pay | Admitting: Oncology

## 2018-05-30 ENCOUNTER — Other Ambulatory Visit (INDEPENDENT_AMBULATORY_CARE_PROVIDER_SITE_OTHER): Payer: Self-pay | Admitting: Vascular Surgery

## 2018-05-30 DIAGNOSIS — I82411 Acute embolism and thrombosis of right femoral vein: Secondary | ICD-10-CM

## 2018-06-01 ENCOUNTER — Ambulatory Visit (INDEPENDENT_AMBULATORY_CARE_PROVIDER_SITE_OTHER): Payer: Medicaid Other

## 2018-06-01 ENCOUNTER — Ambulatory Visit (INDEPENDENT_AMBULATORY_CARE_PROVIDER_SITE_OTHER): Payer: Medicaid Other | Admitting: Vascular Surgery

## 2018-06-01 ENCOUNTER — Encounter (INDEPENDENT_AMBULATORY_CARE_PROVIDER_SITE_OTHER): Payer: Self-pay

## 2018-06-01 DIAGNOSIS — I82411 Acute embolism and thrombosis of right femoral vein: Secondary | ICD-10-CM

## 2018-06-17 ENCOUNTER — Telehealth (INDEPENDENT_AMBULATORY_CARE_PROVIDER_SITE_OTHER): Payer: Self-pay

## 2018-06-17 NOTE — Telephone Encounter (Signed)
Patient didn't receive results because he didn't stay for his apt that was right after his ultrasound.  Spoke with [place], he states patient needs to schedule apt to come in for results.  Can you please call this patient to schedule him a follow up apt for ultrasound results. Thank you. AS,CMA

## 2018-06-17 NOTE — Telephone Encounter (Signed)
Patient called stating he had an u/s on 06/01/2018 and someone was to get back with him about the results and he has not heard anything.

## 2018-06-21 ENCOUNTER — Ambulatory Visit (INDEPENDENT_AMBULATORY_CARE_PROVIDER_SITE_OTHER): Payer: Medicaid Other | Admitting: Nurse Practitioner

## 2018-06-21 ENCOUNTER — Other Ambulatory Visit: Payer: Self-pay

## 2018-06-21 ENCOUNTER — Encounter (INDEPENDENT_AMBULATORY_CARE_PROVIDER_SITE_OTHER): Payer: Self-pay | Admitting: Nurse Practitioner

## 2018-06-21 VITALS — BP 107/67 | HR 65 | Resp 10 | Ht 75.0 in | Wt 250.0 lb

## 2018-06-21 DIAGNOSIS — M5136 Other intervertebral disc degeneration, lumbar region: Secondary | ICD-10-CM

## 2018-06-21 DIAGNOSIS — I82531 Chronic embolism and thrombosis of right popliteal vein: Secondary | ICD-10-CM

## 2018-06-21 DIAGNOSIS — I87001 Postthrombotic syndrome without complications of right lower extremity: Secondary | ICD-10-CM | POA: Diagnosis not present

## 2018-06-21 DIAGNOSIS — E119 Type 2 diabetes mellitus without complications: Secondary | ICD-10-CM

## 2018-06-21 DIAGNOSIS — Z0001 Encounter for general adult medical examination with abnormal findings: Secondary | ICD-10-CM | POA: Diagnosis not present

## 2018-06-21 DIAGNOSIS — Z7901 Long term (current) use of anticoagulants: Secondary | ICD-10-CM

## 2018-06-21 DIAGNOSIS — I87009 Postthrombotic syndrome without complications of unspecified extremity: Secondary | ICD-10-CM | POA: Insufficient documentation

## 2018-06-21 DIAGNOSIS — I82511 Chronic embolism and thrombosis of right femoral vein: Secondary | ICD-10-CM

## 2018-06-21 DIAGNOSIS — Z0100 Encounter for examination of eyes and vision without abnormal findings: Secondary | ICD-10-CM

## 2018-06-21 DIAGNOSIS — Z7984 Long term (current) use of oral hypoglycemic drugs: Secondary | ICD-10-CM

## 2018-06-21 DIAGNOSIS — Z791 Long term (current) use of non-steroidal anti-inflammatories (NSAID): Secondary | ICD-10-CM

## 2018-06-21 NOTE — Progress Notes (Signed)
SUBJECTIVE:  Patient ID: Anthony Bolk., male    DOB: [birth year], 63 y.o.   MRN: [id number] Chief Complaint  Patient presents with  . Follow-up    HPI  Anthony Sindelar. is a 63 y.o. male that presents today to review follow-up studies that he was unable to review the data they were done.  The patient has a history of swelling in his right lower extremity as well as recurrent DVTs.  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  The patient does endorse wearing compression stockings on a daily basis.  He denies any fever, chills, nausea, vomiting or diarrhea.  He denies any chest pain shortness of breath.  He endorses no major issues while on anticoagulation.  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  The posterior femoral vein appears to be occluded but the anterior appears to be patent.  Posterior tibial vein shows normal flow and no thrombus.  Past Medical History:  Diagnosis Date  . [name]   . Cancer ([place])    CLL monitored   . Clotting disorder ([place])   . Diabetes mellitus without complication ([place])    Type II    Past Surgical History:  Procedure Laterality Date  . APPENDECTOMY    . COLONOSCOPY WITH PROPOFOL  02/04/2015   Procedure: COLONOSCOPY WITH PROPOFOL;  Surgeon: [name], MD;  Location: [facility] ENDOSCOPY;  Service: Gastroenterology;;  . ESOPHAGOGASTRODUODENOSCOPY (EGD) WITH PROPOFOL N/A 02/04/2015   Procedure: ESOPHAGOGASTRODUODENOSCOPY (EGD) WITH PROPOFOL;  Surgeon: [name], MD;  Location: [facility] ENDOSCOPY;  Service: Gastroenterology;  Laterality: N/A;  . PERIPHERAL VASCULAR THROMBECTOMY Right 07/02/2016   Procedure: Peripheral Vascular Thrombectomy and IVC filter;  Surgeon: [name], MD;  Location: [place] CV LAB;  Service: Cardiovascular;  Laterality: Right;  . PERIPHERAL VASCULAR  THROMBECTOMY Bilateral 10/30/2016   Procedure: Peripheral Vascular Thrombectomy;  Surgeon: [name], MD;  Location: [place] CV LAB;  Service: Cardiovascular;  Laterality: Bilateral;  . PERIPHERAL VASCULAR THROMBECTOMY Right 05/24/2017   Procedure: PERIPHERAL VASCULAR THROMBECTOMY;  Surgeon: [name], MD;  Location: [place] CV LAB;  Service: Cardiovascular;  Laterality: Right;    Social History   Socioeconomic History  . Marital status: Married    Spouse name: Not on file  . Number of children: Not on file  . Years of education: Not on file  . Highest education level: Not on file  Occupational History  . Not on file  Social Needs  . Financial resource strain: Not on file  . Food insecurity:    Worry: Not on file    Inability: Not on file  . Transportation needs:    Medical: Not on file    Non-medical: Not on file  Tobacco Use  . Smoking status: Never Smoker  . Smokeless tobacco: Never Used  Substance and Sexual Activity  . Alcohol use: No  . Drug use: No  . Sexual activity: Not on file  Lifestyle  . Physical activity:    Days per week: Not on file    Minutes per session: Not on file  . Stress: Not on file  Relationships  . Social connections:    Talks on phone: Not on file    Gets together: Not on file    Attends religious service:  Not on file    Active member of club or organization: Not on file    Attends meetings of clubs or organizations: Not on file    Relationship status: Not on file  . Intimate partner violence:    Fear of current or ex partner: Not on file    Emotionally abused: Not on file    Physically abused: Not on file    Forced sexual activity: Not on file  Other Topics Concern  . Not on file  Social History Narrative  . Not on file    Family History  Problem Relation Age of Onset  . Diabetes Mother   . Cancer Brother   . Cancer Maternal Aunt   . Stroke Maternal Uncle     No Known Allergies   Review of Systems   Review  of Systems: Negative Unless Checked Constitutional: [] Weight loss  [] Fever  [] Chills Cardiac: [] Chest pain   []  Atrial Fibrillation  [] Palpitations   [] Shortness of breath when laying flat   [] Shortness of breath with exertion. [] Shortness of breath at rest Vascular:  [] Pain in legs with walking   [] Pain in legs with standing [] Pain in legs when laying flat   [] Claudication    [] Pain in feet when laying flat    [] History of DVT   [x] Phlebitis   [x] Swelling in legs   [x] Varicose veins   [] Non-healing ulcers Pulmonary:   [] Uses home oxygen   [] Productive cough   [] Hemoptysis   [] Wheeze  [] COPD   [] Asthma Neurologic:  [] Dizziness   [] Seizures  [] Blackouts [] History of stroke   [] History of TIA  [] Aphasia   [] Temporary Blindness   [] Weakness or numbness in arm   [] Weakness or numbness in leg Musculoskeletal:   [] Joint swelling   [x] Joint pain   [] Low back pain  []  History of Knee Replacement [] Arthritis [x] back Surgeries  []  Spinal Stenosis    Hematologic:  [] Easy bruising  [] Easy bleeding   [x] Hypercoagulable state   [] Anemic Gastrointestinal:  [] Diarrhea   [] Vomiting  [] Gastroesophageal reflux/heartburn   [] Difficulty swallowing. [] Abdominal pain Genitourinary:  [] Chronic kidney disease   [] Difficult urination  [] Anuric   [] Blood in urine [] Frequent urination  [] Burning with urination   [] Hematuria Skin:  [] Rashes   [] Ulcers [] Wounds Psychological:  [] History of anxiety   []  History of major depression  []  Memory Difficulties      OBJECTIVE:   Physical Exam  BP 107/67 (BP Location: Left Arm, Patient Position: Sitting, Cuff Size: Large)   Pulse 65   Resp 10   Ht 6\' 3"  (1.905 m)   Wt 250 lb (113.4 kg)   BMI 31.25 kg/m   Gen: WD/WN, NAD Head: [place]/AT, No temporalis wasting.  Ear/Nose/Throat: Hearing grossly intact, nares w/o erythema or drainage Eyes: PER, EOMI, sclera nonicteric.  Neck: Supple, no masses.  No JVD.  Pulmonary:  Good air movement, no use of accessory muscles.  Cardiac:  RRR Vascular: 3+ edema RLE Vessel Right Left  Radial Palpable Palpable  Dorsalis Pedis Palpable Palpable  Posterior Tibial Palpable Palpable   Gastrointestinal: soft, non-distended. No guarding/no peritoneal signs.  Musculoskeletal: M/S 5/5 throughout.  No deformity or atrophy.  Neurologic: Pain and light touch intact in extremities.  Symmetrical.  Speech is fluent. Motor exam as listed above. Psychiatric: Judgment intact, Mood & affect appropriate for pt's clinical situation. Dermatologic: Stasis Dermatits. No Ulcers Noted.  No changes consistent with cellulitis. Lymph : No Cervical lymphadenopathy, no lichenification or skin changes of chronic lymphedema.  ASSESSMENT AND PLAN:  1. Post-phlebitic syndrome Recommend:   No surgery or intervention at this point in time.  IVC filter still present   Patient's duplex ultrasound of the venous system shows chronic DVT from the popliteal to the femoral veins.  The patient is initiated on anticoagulation   Elevation was stressed, use of a recliner was discussed.  I have had a long discussion with the patient regarding DVT and post phlebitic changes such as swelling and why it  causes symptoms such as pain.  The patient will wear graduated compression stockings class 1 (20-30 mmHg), beginning after three full days of anticoagulation, on a daily basis a prescription was given. The patient will  beginning wearing the stockings first thing in the morning and removing them in the evening. The patient is instructed specifically not to sleep in the stockings.  In addition, behavioral modification including elevation during the day and avoidance of prolonged dependency will be initiated.    The patient will continue anticoagulation for now as there have not been any problems or complications at this point.   Patient will follow-up in 3 months in order to determine success with conservative therapy and if additional intervention such as a  lymphedema pump should be introduced.  2. Type 2 diabetes mellitus without complication, unspecified whether long term insulin use ([place]) Continue hypoglycemic medications as already ordered, these medications have been reviewed and there are no changes at this time.  Hgb A1C to be monitored as already arranged by primary service   3. DDD (degenerative disc disease), lumbar Continue NSAID medications as already ordered, these medications have been reviewed and there are no changes at this time.  Continued activity and therapy was stressed.    Current Outpatient Medications on File Prior to Visit  Medication Sig Dispense Refill  . atorvastatin (LIPITOR) 80 MG tablet Take 80 mg by mouth daily.    . metFORMIN (GLUCOPHAGE-XR) 750 MG 24 hr tablet Take 750 mg by mouth daily.    Marland Kitchen warfarin (COUMADIN) 7.5 MG tablet Take 1 tablet (7.5 mg total) by mouth daily at 6 PM. (Patient taking differently: Take 10 mg by mouth daily at 6 PM. ) 30 tablet 0   No current facility-administered medications on file prior to visit.     There are no Patient Instructions on file for this visit. No follow-ups on file.   Kris Hartmann, NP  This note was completed with Sales executive.  Any errors are purely unintentional.

## 2018-07-26 ENCOUNTER — Encounter: Admission: RE | Payer: Self-pay | Source: Home / Self Care

## 2018-07-26 ENCOUNTER — Ambulatory Visit: Admission: RE | Admit: 2018-07-26 | Payer: Medicaid Other | Source: Home / Self Care | Admitting: Gastroenterology

## 2018-07-26 SURGERY — COLONOSCOPY WITH PROPOFOL
Anesthesia: General

## 2018-07-28 ENCOUNTER — Telehealth (INDEPENDENT_AMBULATORY_CARE_PROVIDER_SITE_OTHER): Payer: Self-pay

## 2018-07-28 NOTE — Telephone Encounter (Signed)
Patient had left a voicemail thinking that he might have a dvt because his legs were hurting. I spoke with the patient and he informed me that he went to his primary care physician and had receive medical care.

## 2018-08-26 IMAGING — US US EXTREM LOW VENOUS*R*
1 series · 13 of 24 positions shown · non-contrast
Comparison: 05/19/2017

CLINICAL DATA: Right lower extremity swelling. History of DVT.
History of thrombectomy and IVC filter placement.



[Series 1: us extrem low venous*right* · 0.07mm/px · 40 acquisitions, 13 frames shown]
[im 1/40]
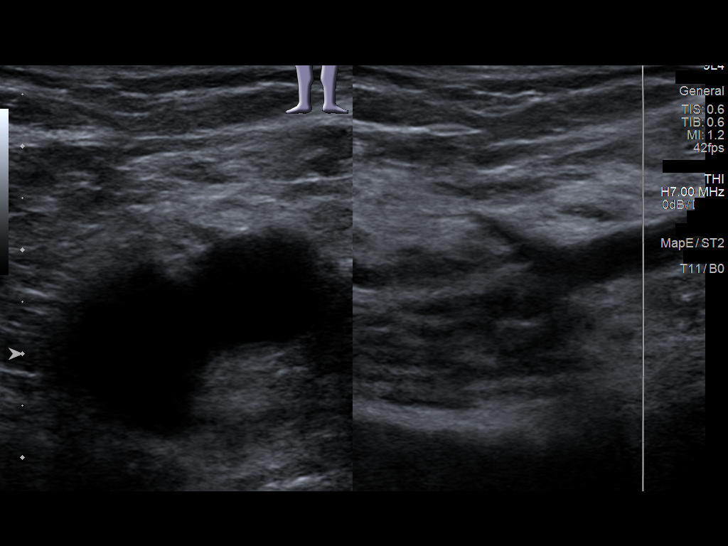
[im 4/40]
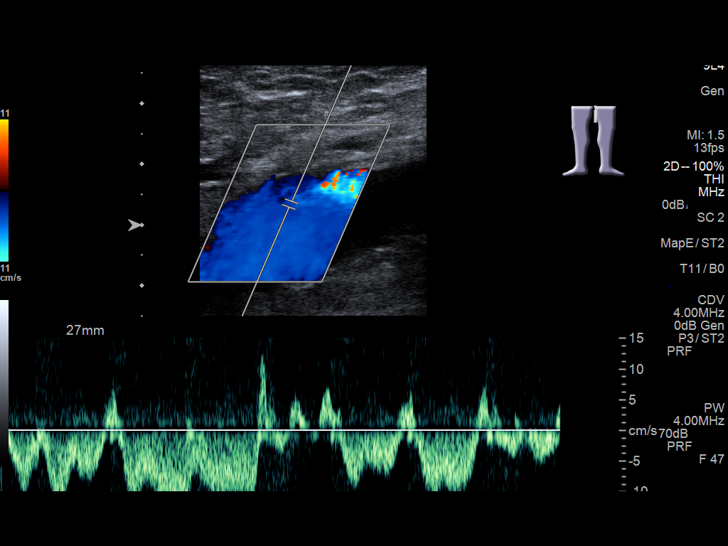
[im 7/40]
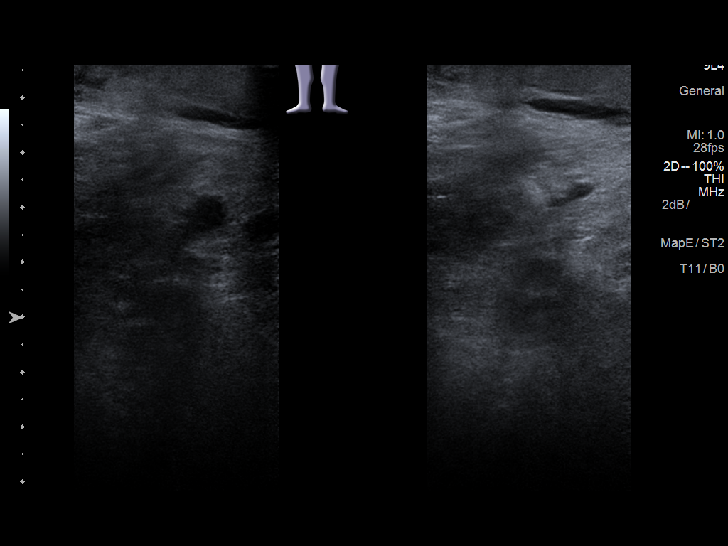
[im 11/40]
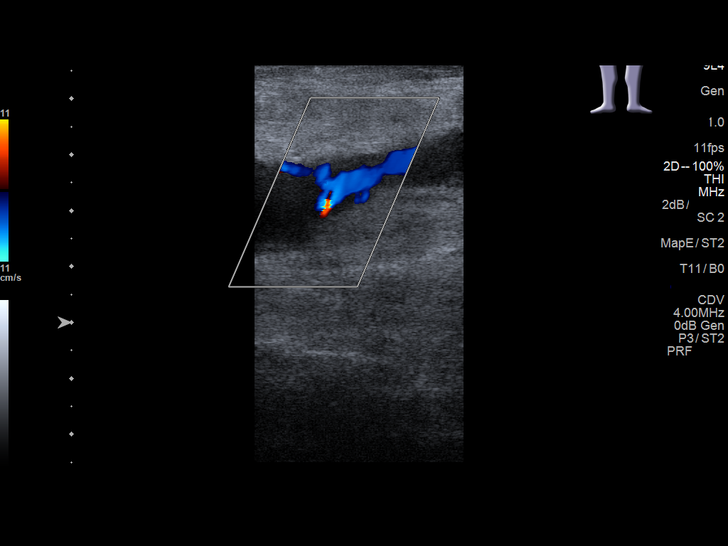
[im 14/40]
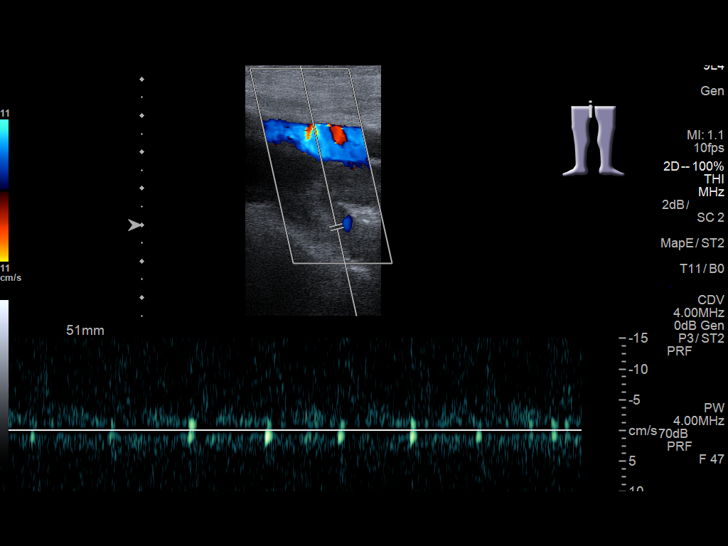
[im 17/40]
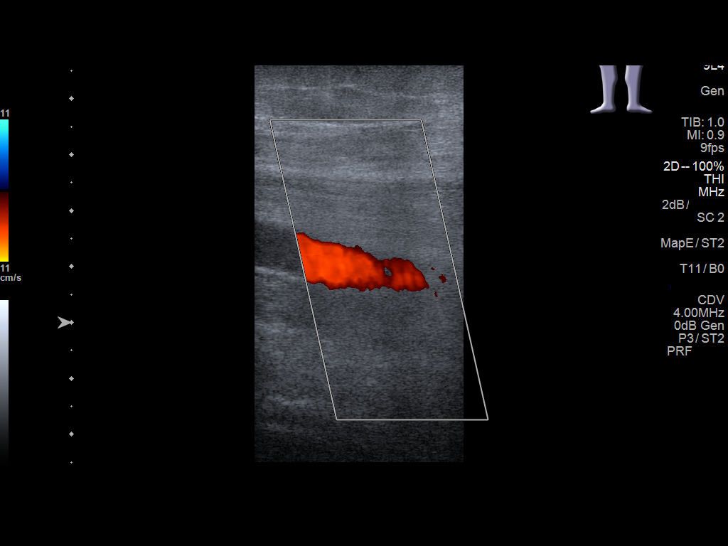
[im 23/40]
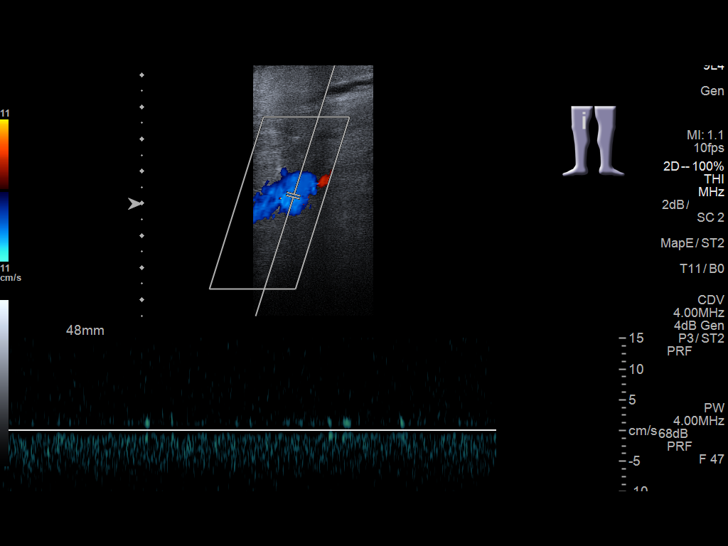
[im 24/40]
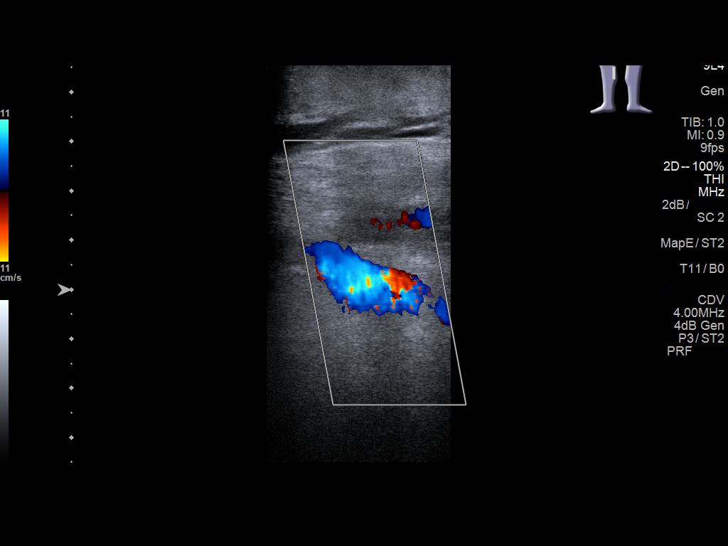
[im 28/40]
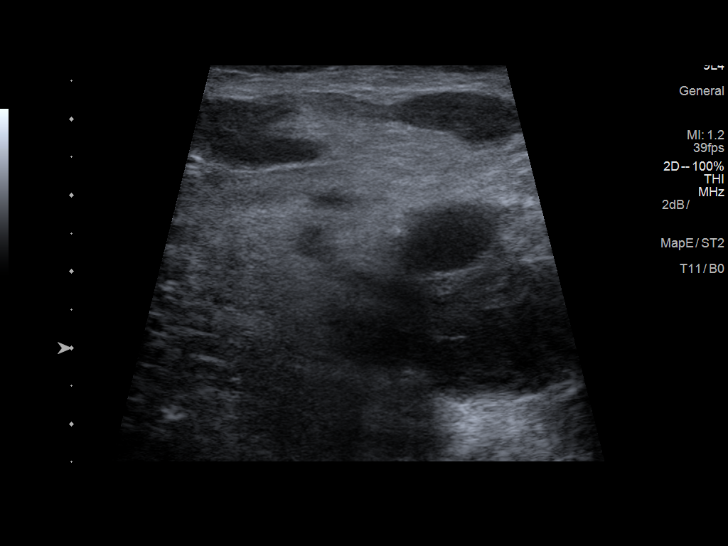
[im 31/40]
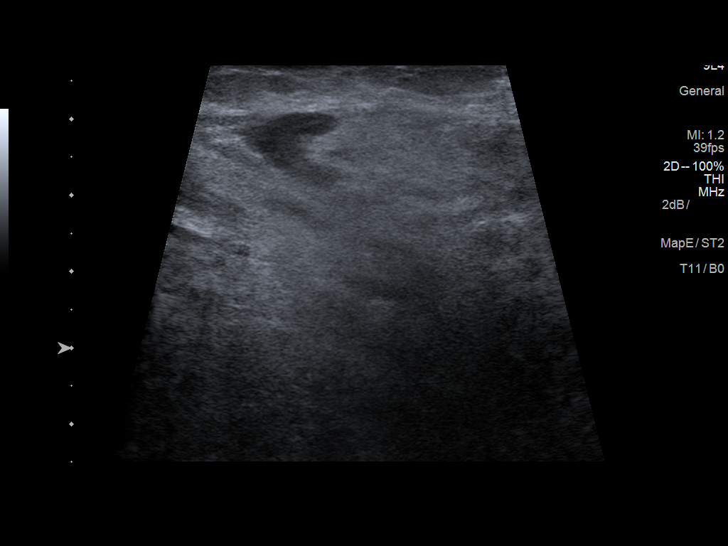
[im 34/40]
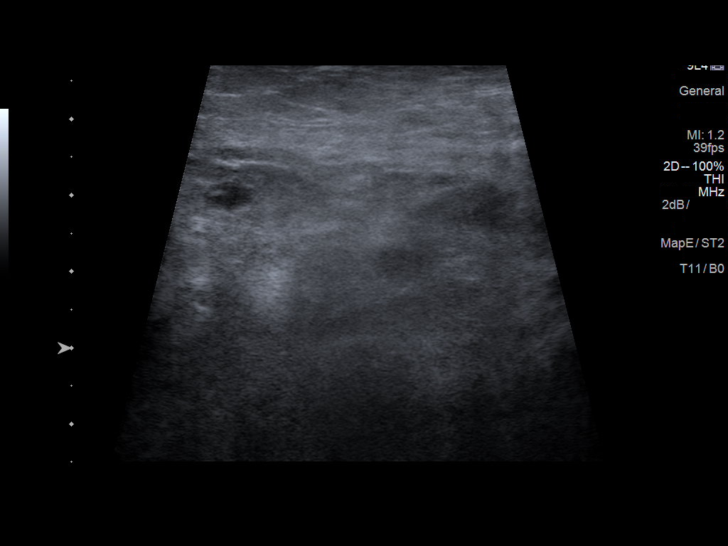
[im 36/40]
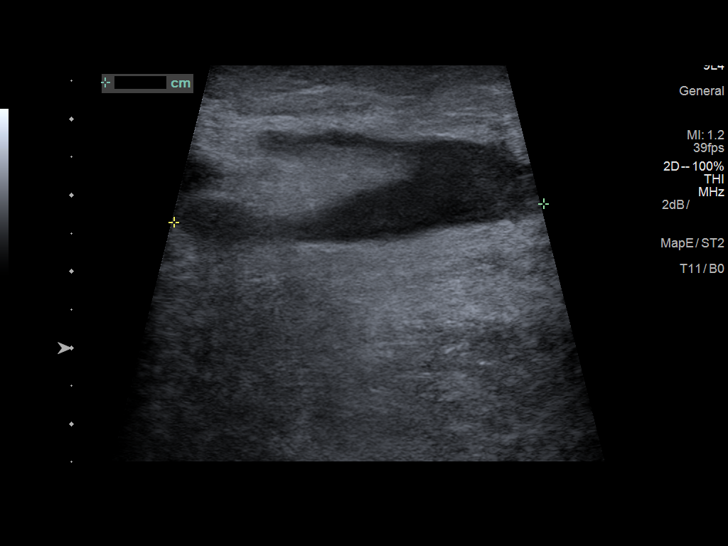
[im 40/40]
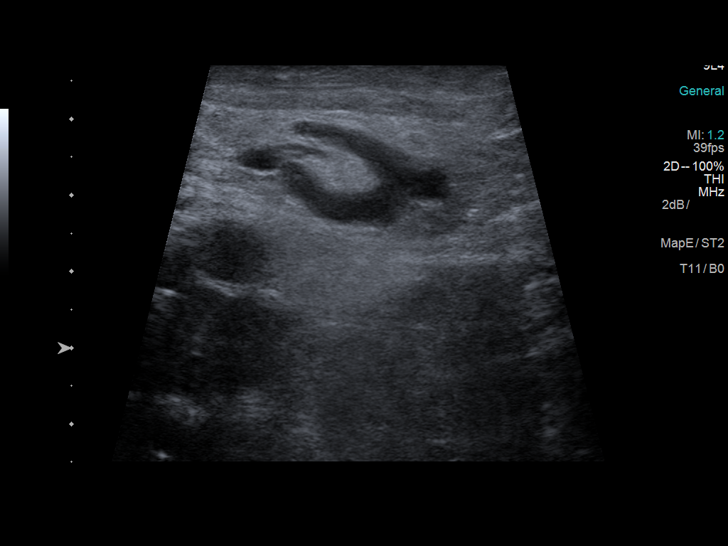

[13 of 24 positions shown; findings below may reference images not displayed]

FINDINGS: Contralateral Common Femoral Vein: Normal compressibility, color
Doppler flow and phasicity in the left common femoral vein.

Common Femoral Vein: Partial compressibility of the right common
femoral vein with echogenic thrombus.

Saphenofemoral Junction: There appears to be nonocclusive thrombus
at the saphenofemoral junction.

Profunda Femoral Vein: Occluded with echogenic thrombus.

Femoral Vein: Occluded with echogenic thrombus.

Popliteal Vein: Limited evaluation but there is concern for
nonocclusive thrombus. No significant compressibility in the
popliteal vein.

Calf Veins: Limited evaluation due to edema.

Other Findings: Prominent lymph nodes in the right groin are likely
reactive.
IMPRESSION: Positive for deep venous thrombosis in the right lower extremity.
There is DVT in the right common femoral vein, profunda femoral vein
and the right femoral vein. Probable thrombus within the right
popliteal vein but limited evaluation. Limited evaluation of the
right calf veins.

## 2018-09-20 ENCOUNTER — Ambulatory Visit (INDEPENDENT_AMBULATORY_CARE_PROVIDER_SITE_OTHER): Payer: Medicaid Other | Admitting: Vascular Surgery

## 2018-10-04 ENCOUNTER — Ambulatory Visit (INDEPENDENT_AMBULATORY_CARE_PROVIDER_SITE_OTHER): Payer: Self-pay | Admitting: Vascular Surgery

## 2018-10-04 ENCOUNTER — Encounter (INDEPENDENT_AMBULATORY_CARE_PROVIDER_SITE_OTHER): Payer: Self-pay | Admitting: Vascular Surgery

## 2018-10-04 ENCOUNTER — Other Ambulatory Visit: Payer: Self-pay

## 2018-10-04 VITALS — BP 120/64 | HR 83 | Resp 14 | Ht 75.0 in | Wt 249.0 lb

## 2018-10-04 DIAGNOSIS — Z95828 Presence of other vascular implants and grafts: Secondary | ICD-10-CM

## 2018-10-04 DIAGNOSIS — I87009 Postthrombotic syndrome without complications of unspecified extremity: Secondary | ICD-10-CM

## 2018-10-04 DIAGNOSIS — Z7984 Long term (current) use of oral hypoglycemic drugs: Secondary | ICD-10-CM

## 2018-10-04 DIAGNOSIS — E119 Type 2 diabetes mellitus without complications: Secondary | ICD-10-CM

## 2018-10-04 NOTE — Progress Notes (Signed)
MRN : [id number]  Anthony Hart. is a 63 y.o. (10-04-1955) male who presents with chief complaint of  Chief Complaint  Patient presents with  . Follow-up  .  History of Present Illness: Patient returns today in follow up of his right leg postphlebitic syndrome from previous episodes of severe DVT.  He underwent venous thrombectomy and thrombolytic therapy.  He is currently doing reasonably well without major issues.  He does have discoloration and some chronic swelling in the right leg which have been present for some time.  Compression stockings help keep this under reasonable control.  No new ulceration or infection.  No left leg symptoms.  Current Outpatient Medications  Medication Sig Dispense Refill  . atorvastatin (LIPITOR) 80 MG tablet Take 80 mg by mouth daily.    . metFORMIN (GLUCOPHAGE-XR) 750 MG 24 hr tablet Take 750 mg by mouth daily.    Marland Kitchen warfarin (COUMADIN) 7.5 MG tablet Take 1 tablet (7.5 mg total) by mouth daily at 6 PM. (Patient taking differently: Take 10 mg by mouth daily at 6 PM. ) 30 tablet 0   No current facility-administered medications for this visit.     Past Medical History:  Diagnosis Date  . [name]   . Cancer ([place])    CLL monitored   . Clotting disorder ([place])   . Diabetes mellitus without complication ([place])    Type II    Past Surgical History:  Procedure Laterality Date  . APPENDECTOMY    . COLONOSCOPY WITH PROPOFOL  02/04/2015   Procedure: COLONOSCOPY WITH PROPOFOL;  Surgeon: [name], MD;  Location: [facility] ENDOSCOPY;  Service: Gastroenterology;;  . ESOPHAGOGASTRODUODENOSCOPY (EGD) WITH PROPOFOL N/A 02/04/2015   Procedure: ESOPHAGOGASTRODUODENOSCOPY (EGD) WITH PROPOFOL;  Surgeon: [name], MD;  Location: [facility] ENDOSCOPY;  Service: Gastroenterology;  Laterality: N/A;  . PERIPHERAL VASCULAR THROMBECTOMY Right 07/02/2016   Procedure: Peripheral Vascular Thrombectomy and IVC filter;  Surgeon: [name], MD;  Location: [place] CV  LAB;  Service: Cardiovascular;  Laterality: Right;  . PERIPHERAL VASCULAR THROMBECTOMY Bilateral 10/30/2016   Procedure: Peripheral Vascular Thrombectomy;  Surgeon: [name], MD;  Location: [place] CV LAB;  Service: Cardiovascular;  Laterality: Bilateral;  . PERIPHERAL VASCULAR THROMBECTOMY Right 05/24/2017   Procedure: PERIPHERAL VASCULAR THROMBECTOMY;  Surgeon: [name], MD;  Location: [place] CV LAB;  Service: Cardiovascular;  Laterality: Right;    Social History Social History   Tobacco Use  . Smoking status: Never Smoker  . Smokeless tobacco: Never Used  Substance Use Topics  . Alcohol use: No  . Drug use: No     Family History Family History  Problem Relation Age of Onset  . Diabetes Mother   . Cancer Brother   . Cancer Maternal Aunt   . Stroke Maternal Uncle     No Known Allergies   REVIEW OF SYSTEMS (Negative unless checked)  Constitutional: [] Weight loss  [] Fever  [] Chills Cardiac: [] Chest pain   [] Chest pressure   [] Palpitations   [] Shortness of breath when laying flat   [] Shortness of breath at rest   [] Shortness of breath with exertion. Vascular:  [] Pain in legs with walking   [] Pain in legs at rest   [] Pain in legs when laying flat   [] Claudication   [] Pain in feet when walking  [] Pain in feet at rest  [] Pain in feet when laying flat   [x] History of DVT   [x] Phlebitis   [x] Swelling in legs   []   Varicose veins   [] Non-healing ulcers Pulmonary:   [] Uses home oxygen   [] Productive cough   [] Hemoptysis   [] Wheeze  [] COPD   [] Asthma Neurologic:  [] Dizziness  [] Blackouts   [] Seizures   [] History of stroke   [] History of TIA  [] Aphasia   [] Temporary blindness   [] Dysphagia   [] Weakness or numbness in arms   [] Weakness or numbness in legs Musculoskeletal:  [x] Arthritis   [] Joint swelling   [] Joint pain   [] Low back pain Hematologic:  [] Easy bruising  [] Easy bleeding   [] Hypercoagulable state   [] Anemic   Gastrointestinal:  [] Blood in stool   [] Vomiting  blood  [] Gastroesophageal reflux/heartburn   [] Abdominal pain Genitourinary:  [] Chronic kidney disease   [] Difficult urination  [] Frequent urination  [] Burning with urination   [] Hematuria Skin:  [] Rashes   [] Ulcers   [] Wounds Psychological:  [] History of anxiety   []  History of major depression.  Physical Examination  BP 120/64 (BP Location: Left Arm, Patient Position: Sitting, Cuff Size: Normal)   Pulse 83   Resp 14   Ht 6\' 3"  (1.905 m)   Wt 249 lb (112.9 kg)   BMI 31.12 kg/m  Gen:  WD/WN, NAD Head: [place]/AT, No temporalis wasting. Ear/Nose/Throat: Hearing grossly intact, nares w/o erythema or drainage Eyes: Conjunctiva clear. Sclera non-icteric Neck: Supple.  Trachea midline Pulmonary:  Good air movement, no use of accessory muscles.  Cardiac: RRR, no JVD Vascular:  Vessel Right Left  Radial Palpable Palpable                          PT Palpable Palpable  DP Palpable Palpable   Gastrointestinal: soft, non-tender/non-distended. No guarding/reflex.  Musculoskeletal: M/S 5/5 throughout.  No deformity or atrophy. Moderate stasis changes to the right leg. 1+ RLE edema. Neurologic: Sensation grossly intact in extremities.  Symmetrical.  Speech is fluent.  Psychiatric: Judgment intact, Mood & affect appropriate for pt's clinical situation. Dermatologic: No rashes or ulcers noted.  No cellulitis or open wounds.       Labs No results found for this or any previous visit (from the past 2160 hour(s)).  Radiology No results found.  Assessment/Plan  Type 2 diabetes mellitus without complications (HCC) blood glucose control important in reducing the progression of atherosclerotic disease. Also, involved in wound healing. On appropriate medications.   Post-phlebitic syndrome The patient has some chronic postphlebitic changes to the right leg which is not surprising given the extensive nature of the DVT he has had previously.  This is reasonably well controlled with  compression stockings, elevation, and remaining active.  At this point, no further surgical or interventional therapy is of benefit.  We discussed that his filter is still in place and he prefers to leave it and as it is not bothering him which is reasonable.  I will now make him an annual visit.    [name], MD  10/04/2018 3:24 PM    This note was created with Dragon medical transcription system.  Any errors from dictation are purely unintentional

## 2018-10-04 NOTE — Assessment & Plan Note (Signed)
blood glucose control important in reducing the progression of atherosclerotic disease. Also, involved in wound healing. On appropriate medications.  

## 2018-10-04 NOTE — Assessment & Plan Note (Signed)
The patient has some chronic postphlebitic changes to the right leg which is not surprising given the extensive nature of the DVT he has had previously.  This is reasonably well controlled with compression stockings, elevation, and remaining active.  At this point, no further surgical or interventional therapy is of benefit.  We discussed that his filter is still in place and he prefers to leave it and as it is not bothering him which is reasonable.  I will now make him an annual visit.

## 2019-08-05 ENCOUNTER — Emergency Department: Payer: Medicaid Other

## 2019-08-05 ENCOUNTER — Other Ambulatory Visit: Payer: Self-pay

## 2019-08-05 ENCOUNTER — Emergency Department
Admission: EM | Admit: 2019-08-05 | Discharge: 2019-08-05 | Disposition: A | Discharge status: Home / Self Care | Payer: Medicaid Other | Attending: Emergency Medicine | Admitting: Emergency Medicine

## 2019-08-05 DIAGNOSIS — J069 Acute upper respiratory infection, unspecified: Secondary | ICD-10-CM | POA: Insufficient documentation

## 2019-08-05 DIAGNOSIS — Z7984 Long term (current) use of oral hypoglycemic drugs: Secondary | ICD-10-CM | POA: Insufficient documentation

## 2019-08-05 DIAGNOSIS — Z79899 Other long term (current) drug therapy: Secondary | ICD-10-CM | POA: Insufficient documentation

## 2019-08-05 DIAGNOSIS — E119 Type 2 diabetes mellitus without complications: Secondary | ICD-10-CM | POA: Insufficient documentation

## 2019-08-05 DIAGNOSIS — Z7901 Long term (current) use of anticoagulants: Secondary | ICD-10-CM | POA: Insufficient documentation

## 2019-08-05 MED ORDER — IBUPROFEN 600 MG PO TABS: 600.0000 mg | ORAL_TABLET | Freq: Three times a day (TID) | ORAL | 0 refills | Status: DC | PRN

## 2019-08-05 MED ORDER — PSEUDOEPH-BROMPHEN-DM 30-2-10 MG/5ML PO SYRP: 5.0000 mL | ORAL_SOLUTION | Freq: Four times a day (QID) | ORAL | 0 refills | Status: DC | PRN

## 2019-08-05 NOTE — Discharge Instructions (Signed)
Follow discharge care instruction take medication as directed. °

## 2019-08-05 NOTE — ED Triage Notes (Signed)
Pt come via POV from home with c/o cough and headache. Pt states this has been going on for 2 weeks. Pt states no relief with medication.  Pt states productive cough with yellow phelgm. Pt denies any fever.

## 2019-08-05 NOTE — ED Provider Notes (Signed)
[facility] Emergency Department Provider Note   ____________________________________________   First MD Initiated Contact with Patient 08/05/19 1320     (approximate)  I have reviewed the triage vital signs and the nursing notes.   HISTORY  Chief Complaint Cough and Headache    HPI Anthony Hart. is a 64 y.o. male patient complains of 2 weeks of cough and frontal headache.  Patient states no relief over-the-counter medications.  Patient took first dose of COVID-19 vaccine 3 days ago.  Patient denies fever/chills.  Patient has body aches.  Patient rates his pain as a 5/10.  Patient described the pain as "achy/pressure.  Patient denies vision disturbance or vertigo.      Past Medical History:  Diagnosis Date  . [name]   . Cancer ([place])    CLL monitored   . Clotting disorder ([place])   . Diabetes mellitus without complication ([place])    Type II    Patient Active Problem List   Diagnosis Date Noted  . 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 DVT (deep venous thrombosis) ([place]) 06/18/2017  . Diabetes ([place]) 05/19/2017  . Abnormal EKG 10/29/2016  . Hyperglycemia 10/29/2016  . Leg DVT (deep venous thromboembolism), acute, bilateral ([place]) 10/28/2016  . CLL (chronic lymphocytic leukemia) ([place]) 07/07/2016  . Recurrent acute deep vein thrombosis (DVT) of lower extremity ([place]) 06/30/2016  . DDD (degenerative disc disease), lumbar 05/28/2015  . Type 2 diabetes mellitus without complications ([place]) [id number]    Past Surgical History:  Procedure Laterality Date  . APPENDECTOMY    . COLONOSCOPY WITH PROPOFOL  02/04/2015   Procedure: COLONOSCOPY WITH PROPOFOL;  Surgeon: [name], MD;  Location: [facility] ENDOSCOPY;  Service: Gastroenterology;;  . ESOPHAGOGASTRODUODENOSCOPY (EGD) WITH PROPOFOL N/A 02/04/2015   Procedure: ESOPHAGOGASTRODUODENOSCOPY (EGD) WITH PROPOFOL;  Surgeon: [name], MD;  Location: [facility] ENDOSCOPY;  Service: Gastroenterology;   Laterality: N/A;  . PERIPHERAL VASCULAR THROMBECTOMY Right 07/02/2016   Procedure: Peripheral Vascular Thrombectomy and IVC filter;  Surgeon: [name], MD;  Location: [place] CV LAB;  Service: Cardiovascular;  Laterality: Right;  . PERIPHERAL VASCULAR THROMBECTOMY Bilateral 10/30/2016   Procedure: Peripheral Vascular Thrombectomy;  Surgeon: [name], MD;  Location: [place] CV LAB;  Service: Cardiovascular;  Laterality: Bilateral;  . PERIPHERAL VASCULAR THROMBECTOMY Right 05/24/2017   Procedure: PERIPHERAL VASCULAR THROMBECTOMY;  Surgeon: [name], MD;  Location: [place] CV LAB;  Service: Cardiovascular;  Laterality: Right;    Prior to Admission medications   Medication Sig Start Date End Date Taking? Authorizing Provider  atorvastatin (LIPITOR) 80 MG tablet Take 80 mg by mouth daily. 05/17/18   [provider]  brompheniramine-pseudoephedrine-DM 30-2-10 MG/5ML syrup Take 5 mLs by mouth 4 (four) times daily as needed. 08/05/19   Sable Feil, PA-C  ibuprofen (ADVIL) 600 MG tablet Take 1 tablet (600 mg total) by mouth every 8 (eight) hours as needed. 08/05/19   Sable Feil, PA-C  metFORMIN (GLUCOPHAGE-XR) 750 MG 24 hr tablet Take 750 mg by mouth daily. 03/09/16 10/04/18  [provider]  warfarin (COUMADIN) 7.5 MG tablet Take 1 tablet (7.5 mg total) by mouth daily at 6 PM. Patient taking differently: Take 10 mg by mouth daily at 6 PM.  07/07/17   [name], Kathlene November, MD    Allergies Patient has no known allergies.  Family History  Problem Relation Age of Onset  . Diabetes Mother   . Cancer Brother   . Cancer Maternal  Aunt   . Stroke Maternal Uncle     Social History Social History   Tobacco Use  . Smoking status: Never Smoker  . Smokeless tobacco: Never Used  Substance Use Topics  . Alcohol use: No  . Drug use: No    Review of Systems Constitutional: No fever/chills Eyes: No visual changes. ENT: No sore throat. Cardiovascular:  Denies chest pain. Respiratory: Denies shortness of breath. Gastrointestinal: No abdominal pain.  No nausea, no vomiting.  No diarrhea.  No constipation. Genitourinary: Negative for dysuria. Musculoskeletal: Negative for back pain. Skin: Negative for rash. Neurological: Positive for headaches, but denies focal weakness or numbness. Endocrine:  Diabetes. Hematological/Lymphatic:  CLL. ____________________________________________   PHYSICAL EXAM:  VITAL SIGNS: ED Triage Vitals  Enc Vitals Group     BP 08/05/19 1149 126/79     Pulse Rate 08/05/19 1149 68     Resp 08/05/19 1149 18     Temp 08/05/19 1149 98.3 F (36.8 C)     Temp Source 08/05/19 1149 Oral     SpO2 08/05/19 1149 97 %     Weight 08/05/19 1150 266 lb (120.7 kg)     Height 08/05/19 1150 6\' 3"  (1.905 m)     Head Circumference --      Peak Flow --      Pain Score 08/05/19 1155 5     Pain Loc --      Pain Edu? --      Excl. in [place]? --    Constitutional: Alert and oriented. Well appearing and in no acute distress. Nose: Edematous nasal turbinates with clear rhinorrhea.  Mouth/Throat: Mucous membranes are moist.  Oropharynx non-erythematous.  Postnasal drainage. Neck: No stridor.  Hematological/Lymphatic/Immunilogical: No cervical lymphadenopathy. Cardiovascular: Normal rate, regular rhythm. Grossly normal heart sounds.  Good peripheral circulation. Respiratory: Normal respiratory effort.  No retractions. Lungs CTAB. Skin:  Skin is warm, dry and intact. No rash noted. ____________________________________________   LABS (all labs ordered are listed, but only abnormal results are displayed)  Labs Reviewed - No data to display ____________________________________________  EKG   ____________________________________________  RADIOLOGY  ED MD interpretation:    Official radiology report(s): DG Chest Portable 1 View  Result Date: 08/05/2019 CLINICAL DATA:  64 year old male with productive cough and headache  EXAM: PORTABLE CHEST 1 VIEW COMPARISON:  Prior chest x-ray 05/23/2017 FINDINGS: The lungs are clear and negative for focal airspace consolidation, pulmonary edema or suspicious pulmonary nodule. No pleural effusion or pneumothorax. Cardiac and mediastinal contours are within normal limits. No acute fracture or lytic or blastic osseous lesions. The visualized upper abdominal bowel gas pattern is unremarkable. IMPRESSION: No active disease. Electronically Signed   By: Jacqulynn Cadet M.D.   On: 08/05/2019 14:28    ____________________________________________   PROCEDURES  Procedure(s) performed (including Critical Care):  Procedures   ____________________________________________   INITIAL IMPRESSION / ASSESSMENT AND PLAN / ED COURSE  As part of my medical decision making, I reviewed the following data within the [place]     Patient presents with cough congestion and frontal headache for 2 weeks.  Patient states cough is productive with yellow phlegm.  Discussed negative x-ray findings with patient.  Discussed sequela of COVID-19 vaccine.  Patient feels exam consistent with viral respiratory illness.  Patient given discharge care instruction advised take medication as directed.  Patient advised follow-up PCP.    Anthony A Cleophus Wills. was evaluated in Emergency Department on 08/05/2019 for the symptoms described in the history of  present illness. He was evaluated in the context of the global COVID-19 pandemic, which necessitated consideration that the patient might be at risk for infection with the SARS-CoV-2 virus that causes COVID-19. Institutional protocols and algorithms that pertain to the evaluation of patients at risk for COVID-19 are in a state of rapid change based on information released by regulatory bodies including the CDC and federal and state organizations. These policies and algorithms were followed during the patient's care in the ED.        ____________________________________________   FINAL CLINICAL IMPRESSION(S) / ED DIAGNOSES  Final diagnoses:  Viral URI with cough     ED Discharge Orders         Ordered    brompheniramine-pseudoephedrine-DM 30-2-10 MG/5ML syrup  4 times daily PRN     08/05/19 1447    ibuprofen (ADVIL) 600 MG tablet  Every 8 hours PRN     08/05/19 1447           Note:  This document was prepared using Dragon voice recognition software and may include unintentional dictation errors.    Sable Feil, PA-C 08/05/19 1450    Blake Divine, MD 08/05/19 [phone]

## 2019-09-25 ENCOUNTER — Emergency Department
Admit: 2019-09-25 | Discharge: 2019-09-26 | Disposition: A | Discharge status: Home / Self Care | Payer: PRIVATE HEALTH INSURANCE

## 2019-09-25 ENCOUNTER — Ambulatory Visit
Admit: 2019-09-25 | Discharge: 2019-09-26 | Disposition: A | Discharge status: Home / Self Care | Payer: PRIVATE HEALTH INSURANCE

## 2019-10-10 ENCOUNTER — Ambulatory Visit (INDEPENDENT_AMBULATORY_CARE_PROVIDER_SITE_OTHER): Payer: Medicaid Other | Admitting: Vascular Surgery

## 2020-04-12 ENCOUNTER — Emergency Department
Admission: EM | Admit: 2020-04-12 | Discharge: 2020-04-12 | Disposition: A | Discharge status: Home / Self Care | Payer: HRSA Program | Attending: Emergency Medicine | Admitting: Emergency Medicine

## 2020-04-12 ENCOUNTER — Other Ambulatory Visit: Payer: Self-pay

## 2020-04-12 DIAGNOSIS — U071 COVID-19: Secondary | ICD-10-CM | POA: Diagnosis not present

## 2020-04-12 DIAGNOSIS — Z7901 Long term (current) use of anticoagulants: Secondary | ICD-10-CM | POA: Diagnosis not present

## 2020-04-12 DIAGNOSIS — E119 Type 2 diabetes mellitus without complications: Secondary | ICD-10-CM | POA: Insufficient documentation

## 2020-04-12 DIAGNOSIS — R5383 Other fatigue: Secondary | ICD-10-CM | POA: Diagnosis present

## 2020-04-12 DIAGNOSIS — Z20822 Contact with and (suspected) exposure to covid-19: Secondary | ICD-10-CM

## 2020-04-12 DIAGNOSIS — Z7984 Long term (current) use of oral hypoglycemic drugs: Secondary | ICD-10-CM | POA: Diagnosis not present

## 2020-04-12 DIAGNOSIS — Z856 Personal history of leukemia: Secondary | ICD-10-CM | POA: Insufficient documentation

## 2020-04-12 DIAGNOSIS — I10 Essential (primary) hypertension: Secondary | ICD-10-CM | POA: Diagnosis not present

## 2020-04-12 HISTORY — DX: Essential (primary) hypertension: I10

## 2020-04-12 NOTE — ED Provider Notes (Signed)
[facility] Emergency Department Provider Note   ____________________________________________    I have reviewed the triage vital signs and the nursing notes.   HISTORY  Chief Complaint Generalized Body Aches     HPI Anthony Hart. is a 64 y.o. male with history of diabetes who presents with complaints of fatigue nausea, body aches, chills.  His daughter has COVID-19.  He has been vaccinated and does have a booster.  Has not taken anything for this recently.  Is here with his daughter who has abdominal pain, she is checking him  Past Medical History:  Diagnosis Date  . [name]   . Cancer (HCC)    CLL monitored   . Clotting disorder (HCC)   . Diabetes mellitus without complication (HCC)    Type II  . Hypertension     Patient Active Problem List   Diagnosis Date Noted  . 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 DVT (deep venous thrombosis) (HCC) 06/18/2017  . Diabetes (HCC) 05/19/2017  . Abnormal EKG 10/29/2016  . Hyperglycemia 10/29/2016  . Leg DVT (deep venous thromboembolism), acute, bilateral (HCC) 10/28/2016  . CLL (chronic lymphocytic leukemia) (HCC) 07/07/2016  . Recurrent acute deep vein thrombosis (DVT) of lower extremity (HCC) 06/30/2016  . DDD (degenerative disc disease), lumbar 05/28/2015  . Type 2 diabetes mellitus without complications (HCC) 01/04/2014    Past Surgical History:  Procedure Laterality Date  . APPENDECTOMY    . COLONOSCOPY WITH PROPOFOL  02/04/2015   Procedure: COLONOSCOPY WITH PROPOFOL;  Surgeon: Wallace Cullens, MD;  Location: [facility] ENDOSCOPY;  Service: Gastroenterology;;  . ESOPHAGOGASTRODUODENOSCOPY (EGD) WITH PROPOFOL N/A 02/04/2015   Procedure: ESOPHAGOGASTRODUODENOSCOPY (EGD) WITH PROPOFOL;  Surgeon: Wallace Cullens, MD;  Location: [facility] ENDOSCOPY;  Service: Gastroenterology;  Laterality: N/A;  . PERIPHERAL VASCULAR THROMBECTOMY Right 07/02/2016   Procedure: Peripheral Vascular Thrombectomy and IVC filter;   Surgeon: Annice Needy, MD;  Location: [facility] INVASIVE CV LAB;  Service: Cardiovascular;  Laterality: Right;  . PERIPHERAL VASCULAR THROMBECTOMY Bilateral 10/30/2016   Procedure: Peripheral Vascular Thrombectomy;  Surgeon: Annice Needy, MD;  Location: [facility] INVASIVE CV LAB;  Service: Cardiovascular;  Laterality: Bilateral;  . PERIPHERAL VASCULAR THROMBECTOMY Right 05/24/2017   Procedure: PERIPHERAL VASCULAR THROMBECTOMY;  Surgeon: Annice Needy, MD;  Location: [facility] INVASIVE CV LAB;  Service: Cardiovascular;  Laterality: Right;    Prior to Admission medications   Medication Sig Start Date End Date Taking? Authorizing Provider  atorvastatin (LIPITOR) 80 MG tablet Take 80 mg by mouth daily. 05/17/18   [provider]  brompheniramine-pseudoephedrine-DM 30-2-10 MG/5ML syrup Take 5 mLs by mouth 4 (four) times daily as needed. 08/05/19   Joni Reining, PA-C  ibuprofen (ADVIL) 600 MG tablet Take 1 tablet (600 mg total) by mouth every 8 (eight) hours as needed. 08/05/19   Joni Reining, PA-C  metFORMIN (GLUCOPHAGE-XR) 750 MG 24 hr tablet Take 750 mg by mouth daily. 03/09/16 10/04/18  [provider]  warfarin (COUMADIN) 7.5 MG tablet Take 1 tablet (7.5 mg total) by mouth daily at 6 PM. Patient taking differently: Take 10 mg by mouth daily at 6 PM.  07/07/17   Orlie Dakin, Tollie Pizza, MD     Allergies Patient has no known allergies.  Family History  Problem Relation Age of Onset  . Diabetes Mother   . Cancer Brother   . Cancer Maternal Aunt   . Stroke Maternal Uncle     Social History Social History   Tobacco  Use  . Smoking status: Never Smoker  . Smokeless tobacco: Never Used  Substance Use Topics  . Alcohol use: Yes  . Drug use: No    Review of Systems  Constitutional: As above  ENT: Congestion   Gastrointestinal: No abdominal pain.    Musculoskeletal: Myalgias Skin: Negative for rash. Neurological:  Headaches   ____________________________________________   PHYSICAL EXAM:  VITAL SIGNS: ED Triage Vitals  Enc Vitals Group     BP 04/12/20 1850 128/76     Pulse Rate 04/12/20 1850 100     Resp 04/12/20 1850 16     Temp 04/12/20 1850 99.3 F (37.4 C)     Temp Source 04/12/20 1850 Oral     SpO2 04/12/20 1850 97 %     Weight 04/12/20 1851 77.1 kg (170 lb)     Height 04/12/20 1851 1.905 m (6\' 3" )     Head Circumference --      Peak Flow --      Pain Score 04/12/20 1851 7     Pain Loc --      Pain Edu? --      Excl. in [place]? --     Constitutional: Alert and oriented.   Nose: No congestion/rhinnorhea. Mouth/Throat: Mucous membranes are moist.   Cardiovascular: Normal rate, regular rhythm.  Respiratory: Normal respiratory effort.  No retractions.  Musculoskeletal: No lower extremity tenderness nor edema.   Neurologic:  Normal speech and language. No gross focal neurologic deficits are appreciated.   Skin:  Skin is warm, dry and intact. No rash noted.   ____________________________________________   LABS (all labs ordered are listed, but only abnormal results are displayed)  Labs Reviewed  SARS CORONAVIRUS 2 (TAT 6-24 HRS)   ____________________________________________  EKG   ____________________________________________  RADIOLOGY  None ____________________________________________   PROCEDURES  Procedure(s) performed: No  Procedures   Critical Care performed: No ____________________________________________   INITIAL IMPRESSION / ASSESSMENT AND PLAN / ED COURSE  Pertinent labs & imaging results that were available during my care of the patient were reviewed by me and considered in my medical decision making (see chart for details).  Patient overall well-appearing in no acute distress, vital signs are reassuring.  Given his family contact with [id number] he is almost certainly positive.  We will send Covid PCR, supportive care, outpatient follow-up,  return precautions    ____________________________________________   FINAL CLINICAL IMPRESSION(S) / ED DIAGNOSES  Final diagnoses:  Suspected COVID-19 virus infection      NEW MEDICATIONS STARTED DURING THIS VISIT:  New Prescriptions   No medications on file     Note:  This document was prepared using Dragon voice recognition software and may include unintentional dictation errors.   Lavonia Drafts, MD 04/12/20 [phone]

## 2020-04-12 NOTE — ED Triage Notes (Signed)
FIRST NURSE NOTE: Pt here with covid sxs, was in contact with daughter who also has covid yesterday.

## 2020-04-12 NOTE — ED Triage Notes (Signed)
Pt to ED via POV with complaints of generalized body aches, headache, fatigue after coming in contact with his covid positive daughter. Symptoms started yesterday. States yesterday all he could do was sleep and slept for 10 hours. Denies SHOB/ denies CP. Reports possible fever at home states he did not check with a thermometer.   Hx Diabetes, htn.

## 2020-04-12 NOTE — ED Notes (Signed)
Pt left before discharge signature. Pt was provided with paperwork per RN shift report.

## 2020-04-13 LAB — SARS CORONAVIRUS 2 (TAT 6-24 HRS): SARS Coronavirus 2: POSITIVE — AB

## 2021-05-07 ENCOUNTER — Emergency Department
Admit: 2021-05-07 | Discharge: 2021-05-07 | Disposition: A | Discharge status: Home / Self Care | Payer: PRIVATE HEALTH INSURANCE

## 2021-05-07 ENCOUNTER — Ambulatory Visit
Admit: 2021-05-07 | Discharge: 2021-05-07 | Disposition: A | Discharge status: Home / Self Care | Payer: PRIVATE HEALTH INSURANCE

## 2021-05-07 DIAGNOSIS — I825Y3 Chronic embolism and thrombosis of unspecified deep veins of proximal lower extremity, bilateral: Principal | ICD-10-CM

## 2021-05-07 DIAGNOSIS — C9110 Chronic lymphocytic leukemia of B-cell type not having achieved remission: Principal | ICD-10-CM

## 2021-05-07 MED ORDER — WARFARIN 5 MG TABLET
ORAL_TABLET | Freq: Every day | ORAL | 0 refills | 20.00000 days | Status: CP
Start: 2021-05-07 — End: 2021-05-27

## 2021-05-26 DIAGNOSIS — C9110 Chronic lymphocytic leukemia of B-cell type not having achieved remission: Principal | ICD-10-CM

## 2021-05-26 DIAGNOSIS — D696 Thrombocytopenia, unspecified: Principal | ICD-10-CM

## 2021-05-26 DIAGNOSIS — Z1159 Encounter for screening for other viral diseases: Principal | ICD-10-CM

## 2021-05-28 ENCOUNTER — Ambulatory Visit: Admit: 2021-05-28 | Discharge: 2021-05-28 | Payer: PRIVATE HEALTH INSURANCE

## 2021-05-28 DIAGNOSIS — R197 Diarrhea, unspecified: Principal | ICD-10-CM

## 2021-05-28 DIAGNOSIS — I825Y3 Chronic embolism and thrombosis of unspecified deep veins of proximal lower extremity, bilateral: Principal | ICD-10-CM

## 2021-05-28 DIAGNOSIS — C9110 Chronic lymphocytic leukemia of B-cell type not having achieved remission: Principal | ICD-10-CM

## 2021-08-28 ENCOUNTER — Ambulatory Visit: Admit: 2021-08-28 | Attending: Internal Medicine | Primary: Internal Medicine

## 2022-06-24 ENCOUNTER — Other Ambulatory Visit: Payer: Self-pay

## 2022-06-24 ENCOUNTER — Emergency Department
Admission: EM | Admit: 2022-06-24 | Discharge: 2022-06-24 | Disposition: A | Discharge status: Home / Self Care | Payer: Managed Care, Other (non HMO) | Attending: Student in an Organized Health Care Education/Training Program | Admitting: Student in an Organized Health Care Education/Training Program

## 2022-06-24 ENCOUNTER — Emergency Department: Payer: Managed Care, Other (non HMO)

## 2022-06-24 ENCOUNTER — Encounter: Payer: Self-pay | Admitting: Radiology

## 2022-06-24 DIAGNOSIS — Z7901 Long term (current) use of anticoagulants: Secondary | ICD-10-CM | POA: Diagnosis not present

## 2022-06-24 DIAGNOSIS — R059 Cough, unspecified: Secondary | ICD-10-CM | POA: Diagnosis present

## 2022-06-24 DIAGNOSIS — Z1152 Encounter for screening for COVID-19: Secondary | ICD-10-CM | POA: Insufficient documentation

## 2022-06-24 DIAGNOSIS — C9110 Chronic lymphocytic leukemia of B-cell type not having achieved remission: Secondary | ICD-10-CM

## 2022-06-24 DIAGNOSIS — R051 Acute cough: Secondary | ICD-10-CM

## 2022-06-24 LAB — BASIC METABOLIC PANEL
Anion gap: 3 — ABNORMAL LOW (ref 5–15)
BUN: 17 mg/dL (ref 8–23)
CO2: 25 mmol/L (ref 22–32)
Calcium: 8.5 mg/dL — ABNORMAL LOW (ref 8.9–10.3)
Chloride: 111 mmol/L (ref 98–111)
Creatinine, Ser: 1.57 mg/dL — ABNORMAL HIGH (ref 0.61–1.24)
GFR, Estimated: 49 mL/min — ABNORMAL LOW (ref >60)
Glucose, Bld: 105 mg/dL — ABNORMAL HIGH (ref 70–99)
Potassium: 5.5 mmol/L — ABNORMAL HIGH (ref 3.5–5.1)
Sodium: 139 mmol/L (ref 135–145)

## 2022-06-24 LAB — CBC WITH DIFFERENTIAL/PLATELET
Abs Immature Granulocytes: 0.11 10*3/uL — ABNORMAL HIGH (ref 0.00–0.07)
Basophils Absolute: 0.1 10*3/uL (ref 0.0–0.1)
Basophils Relative: 0 %
Eosinophils Absolute: 0.1 10*3/uL (ref 0.0–0.5)
Eosinophils Relative: 0 %
HCT: 35.4 % — ABNORMAL LOW (ref 39.0–52.0)
Hemoglobin: 10.3 g/dL — ABNORMAL LOW (ref 13.0–17.0)
Immature Granulocytes: 0 %
Lymphocytes Relative: 98 %
Lymphs Abs: 121.9 10*3/uL — ABNORMAL HIGH (ref 0.7–4.0)
MCH: 28.2 pg (ref 26.0–34.0)
MCHC: 29.1 g/dL — ABNORMAL LOW (ref 30.0–36.0)
MCV: 97 fL (ref 80.0–100.0)
Monocytes Absolute: 0.3 10*3/uL (ref 0.1–1.0)
Monocytes Relative: 0 %
Neutro Abs: 2.2 10*3/uL (ref 1.7–7.7)
Neutrophils Relative %: 2 %
Platelets: 62 10*3/uL — ABNORMAL LOW (ref 150–400)
RBC: 3.65 MIL/uL — ABNORMAL LOW (ref 4.22–5.81)
RDW: 16.9 % — ABNORMAL HIGH (ref 11.5–15.5)
Smear Review: NORMAL
WBC: 124.7 10*3/uL (ref 4.0–10.5)
nRBC: 0 % (ref 0.0–0.2)

## 2022-06-24 LAB — RESP PANEL BY RT-PCR (RSV, FLU A&B, COVID)  RVPGX2
Influenza A by PCR: NEGATIVE
Influenza B by PCR: NEGATIVE
Resp Syncytial Virus by PCR: NEGATIVE
SARS Coronavirus 2 by RT PCR: NEGATIVE

## 2022-06-24 LAB — CBC
HCT: 38.6 % — ABNORMAL LOW (ref 39.0–52.0)
Hemoglobin: 11 g/dL — ABNORMAL LOW (ref 13.0–17.0)
MCH: 27.9 pg (ref 26.0–34.0)
MCHC: 28.5 g/dL — ABNORMAL LOW (ref 30.0–36.0)
MCV: 98 fL (ref 80.0–100.0)
Platelets: 69 10*3/uL — ABNORMAL LOW (ref 150–400)
RBC: 3.94 MIL/uL — ABNORMAL LOW (ref 4.22–5.81)
RDW: 17.4 % — ABNORMAL HIGH (ref 11.5–15.5)
WBC: 146.3 10*3/uL (ref 4.0–10.5)
nRBC: 0 % (ref 0.0–0.2)

## 2022-06-24 LAB — TYPE AND SCREEN
ABO/RH(D): O POS
Antibody Screen: NEGATIVE

## 2022-06-24 LAB — POTASSIUM: Potassium: 4.3 mmol/L (ref 3.5–5.1)

## 2022-06-24 LAB — TROPONIN I (HIGH SENSITIVITY)
Troponin I (High Sensitivity): 4 ng/L (ref <18)
Troponin I (High Sensitivity): 3 ng/L (ref <18)

## 2022-06-24 LAB — PATHOLOGIST SMEAR REVIEW

## 2022-06-24 MED ORDER — PATIROMER SORBITEX CALCIUM 8.4 G PO PACK
16.8000 g | PACK | Freq: Every day | ORAL | Status: DC
  Administered 2022-06-24: 16.8 g via ORAL
  Filled 2022-06-24: qty 2

## 2022-06-24 MED ORDER — AZITHROMYCIN 250 MG PO TABS: ORAL_TABLET | ORAL | 0 refills | Status: DC

## 2022-06-24 MED ORDER — IOHEXOL 350 MG/ML SOLN
75.0000 mL | Freq: Once | INTRAVENOUS | Status: AC | PRN
  Administered 2022-06-24: 75 mL via INTRAVENOUS

## 2022-06-24 MED ORDER — IPRATROPIUM-ALBUTEROL 0.5-2.5 (3) MG/3ML IN SOLN
3.0000 mL | Freq: Once | RESPIRATORY_TRACT | Status: AC
  Administered 2022-06-24: 3 mL via RESPIRATORY_TRACT
  Filled 2022-06-24: qty 3

## 2022-06-24 MED ORDER — SODIUM CHLORIDE 0.9 % IV BOLUS
1000.0000 mL | Freq: Once | INTRAVENOUS | Status: AC
  Administered 2022-06-24: 1000 mL via INTRAVENOUS

## 2022-06-24 MED ORDER — ALBUTEROL SULFATE HFA 108 (90 BASE) MCG/ACT IN AERS: 2.0000 | INHALATION_SPRAY | Freq: Four times a day (QID) | RESPIRATORY_TRACT | 2 refills | Status: DC | PRN

## 2022-06-24 NOTE — ED Provider Notes (Signed)
[facility] Provider Note    Event Date/Time   First MD Initiated Contact with Patient 06/24/22 1202     (approximate)   History   No chief complaint on file.   HPI  Anthony Hart. is a 67 y.o. male with a history of CLL as well as history of DVT on Xarelto status post filter presents to the ER for evaluation of several days of worsening cough some shortness of breath and hemoptysis.  States he will pass reexcised clots.  Denies any chest pain or pressure has been compliant with his medications he is not currently on treatment for CLL.  No massive hemoptysis.  No nosebleeds.  No sore throat.     Physical Exam   Triage Vital Signs: ED Triage Vitals  Enc Vitals Group     BP 06/24/22 1114 111/65     Pulse Rate 06/24/22 1111 61     Resp 06/24/22 1111 16     Temp 06/24/22 1207 98.2 F (36.8 C)     Temp Source 06/24/22 1111 Oral     SpO2 06/24/22 1111 97 %     Weight 06/24/22 1112 169 lb 15.6 oz (77.1 kg)     Height 06/24/22 1112 '6\' 3"'$  (1.905 m)     Head Circumference --      Peak Flow --      Pain Score 06/24/22 1112 3     Pain Loc --      Pain Edu? --      Excl. in [place]? --     Most recent vital signs: Vitals:   06/24/22 1203 06/24/22 1207  BP: 115/73   Pulse: (!) 56   Resp: 14   Temp:  98.2 F (36.8 C)  SpO2: 100%      Constitutional: Alert  Eyes: Conjunctivae are normal.  Head: Atraumatic. Nose: No congestion/rhinnorhea. Mouth/Throat: Mucous membranes are moist.   Neck: Painless ROM.  Cardiovascular:   Good peripheral circulation. Respiratory: Normal respiratory effort.  No retractions.  Gastrointestinal: Soft and nontender.  Musculoskeletal:  no deformity Neurologic:  MAE spontaneously. No gross focal neurologic deficits are appreciated.  Skin:  Skin is warm, dry and intact. No rash noted. Psychiatric: Mood and affect are normal. Speech and behavior are normal.    ED Results / Procedures / Treatments   Labs (all labs  ordered are listed, but only abnormal results are displayed) Labs Reviewed  BASIC METABOLIC PANEL - Abnormal; Notable for the following components:      Result Value   Potassium 5.5 (*)    Glucose, Bld 105 (*)    Creatinine, Ser 1.57 (*)    Calcium 8.5 (*)    GFR, Estimated 49 (*)    Anion gap 3 (*)    All other components within normal limits  CBC - Abnormal; Notable for the following components:   WBC 146.3 (*)    RBC 3.94 (*)    Hemoglobin 11.0 (*)    HCT 38.6 (*)    MCHC 28.5 (*)    RDW 17.4 (*)    Platelets 69 (*)    All other components within normal limits  CBC WITH DIFFERENTIAL/PLATELET - Abnormal; Notable for the following components:   WBC 124.7 (*)    RBC 3.65 (*)    Hemoglobin 10.3 (*)    HCT 35.4 (*)    MCHC 29.1 (*)    RDW 16.9 (*)    Platelets 62 (*)    Lymphs Abs  121.9 (*)    Abs Immature Granulocytes 0.11 (*)    All other components within normal limits  RESP PANEL BY RT-PCR (RSV, FLU A&B, COVID)  RVPGX2  POTASSIUM  PATHOLOGIST SMEAR REVIEW  TYPE AND SCREEN  TROPONIN I (HIGH SENSITIVITY)  TROPONIN I (HIGH SENSITIVITY)     EKG  ED ECG REPORT I, Merlyn Lot, the attending physician, personally viewed and interpreted this ECG.   Date: 06/24/2022  EKG Time: 11:19  Rate: 70  Rhythm: sinus  Axis: normal  Intervals:normal  ST&T Change: no stemi, no depressions    RADIOLOGY Please see ED Course for my review and interpretation.  I personally reviewed all radiographic images ordered to evaluate for the above acute complaints and reviewed radiology reports and findings.  These findings were personally discussed with the patient.  Please see medical record for radiology report.    PROCEDURES:  Critical Care performed: No  Procedures   MEDICATIONS ORDERED IN ED: Medications  patiromer [facility] Iv) packet 16.8 g (16.8 g Oral Given 06/24/22 1332)  iohexol (OMNIPAQUE) 350 MG/ML injection 75 mL (75 mLs Intravenous Contrast Given 06/24/22 1234)   sodium chloride 0.9 % bolus 1,000 mL (1,000 mLs Intravenous New Bag/Given 06/24/22 1322)  ipratropium-albuterol (DUONEB) 0.5-2.5 (3) MG/3ML nebulizer solution 3 mL (3 mLs Nebulization Given 06/24/22 1322)     IMPRESSION / MDM / ASSESSMENT AND PLAN / ED COURSE  I reviewed the triage vital signs and the nursing notes.                              Differential diagnosis includes, but is not limited to, Asthma, copd, CHF, pna, ptx, malignancy, Pe, anemia  Patient presenting to the ER for evaluation of symptoms as described above.  Based on symptoms, risk factors and considered above differential, this presenting complaint could reflect a potentially life-threatening illness therefore the patient will be placed on continuous pulse oximetry and telemetry for monitoring.  Laboratory evaluation will be sent to evaluate for the above complaints.  My review and interpretation without evidence of consolidation.  Given a presentation risk factors will order CTA to further evaluate for the above differential.  Do not appreciate any significant wheeze.     Clinical Course as of 06/24/22 1445  Wed Jun 24, 2022  1234 Patient with critically elevated white count concerning for acute on chronic leukemia.  Will consult oncology. [PR]  [id number] On review of care everywhere his white count has been this high before but his platelets are slowly downtrending. [PR]  1315 CT without PE. [PR]  1433 Patient reassessed.  Reviewed past potassium normal.  Will give albuterol inhaler and give prescription for doxycycline in case patient has a component of bronchitis given elevated white count chronically less reliable marker he is not septic he is not hypoxic does appear stable and appropriate for outpatient follow-up if he is not having any additional hemoptysis.  In discussion in consultation with Dr. [name] of oncology at this point he would like to discuss with the patient initiating treatment for his CLL and states will  help work the patient into clinic next week.  Discussed strict return precautions with patient and agreeable to plan. [PR]    Clinical Course User Index [PR] Merlyn Lot, MD     FINAL CLINICAL IMPRESSION(S) / ED DIAGNOSES   Final diagnoses:  Acute cough  CLL (chronic lymphocytic leukemia) ([place])     Rx / DC Orders  ED Discharge Orders          Ordered    albuterol (VENTOLIN HFA) 108 (90 Base) MCG/ACT inhaler  Every 6 hours PRN        06/24/22 1436    azithromycin (ZITHROMAX Z-PAK) 250 MG tablet        06/24/22 1436             Note:  This document was prepared using Dragon voice recognition software and may include unintentional dictation errors.    Merlyn Lot, MD 06/24/22 [phone]

## 2022-06-24 NOTE — ED Triage Notes (Signed)
C/O 'spitting up blood' for the past two days.  Noticed blood in the morning .

## 2022-06-24 NOTE — ED Notes (Signed)
Lab called with critical wbc count of 146.3. provider, Quentin Cornwall, told face to face. No new orders.

## 2022-07-02 ENCOUNTER — Ambulatory Visit: Admit: 2022-07-02 | Discharge: 2022-07-02 | Payer: PRIVATE HEALTH INSURANCE

## 2022-07-02 DIAGNOSIS — R042 Hemoptysis: Principal | ICD-10-CM

## 2022-07-02 DIAGNOSIS — R3129 Other microscopic hematuria: Principal | ICD-10-CM

## 2022-07-02 DIAGNOSIS — C9110 Chronic lymphocytic leukemia of B-cell type not having achieved remission: Principal | ICD-10-CM

## 2022-07-02 DIAGNOSIS — D849 Immunodeficiency, unspecified: Principal | ICD-10-CM

## 2022-07-02 DIAGNOSIS — C801 Malignant (primary) neoplasm, unspecified: Principal | ICD-10-CM

## 2022-07-02 DIAGNOSIS — D630 Anemia in neoplastic disease: Principal | ICD-10-CM

## 2022-07-02 DIAGNOSIS — D696 Thrombocytopenia, unspecified: Principal | ICD-10-CM

## 2022-07-10 ENCOUNTER — Inpatient Hospital Stay: Payer: Managed Care, Other (non HMO)

## 2022-07-10 ENCOUNTER — Inpatient Hospital Stay: Payer: Managed Care, Other (non HMO) | Admitting: Oncology

## 2022-07-12 ENCOUNTER — Ambulatory Visit: Admit: 2022-07-12 | Discharge: 2022-07-13 | Payer: PRIVATE HEALTH INSURANCE

## 2022-07-13 ENCOUNTER — Encounter: Payer: Self-pay | Admitting: Family Medicine

## 2022-07-14 DIAGNOSIS — R042 Hemoptysis: Principal | ICD-10-CM

## 2022-07-16 ENCOUNTER — Ambulatory Visit: Admit: 2022-07-16 | Discharge: 2022-07-16 | Payer: PRIVATE HEALTH INSURANCE

## 2022-07-16 ENCOUNTER — Other Ambulatory Visit: Admit: 2022-07-16 | Discharge: 2022-07-16 | Payer: PRIVATE HEALTH INSURANCE

## 2022-07-16 DIAGNOSIS — C9110 Chronic lymphocytic leukemia of B-cell type not having achieved remission: Principal | ICD-10-CM

## 2022-07-16 DIAGNOSIS — L03211 Cellulitis of face: Principal | ICD-10-CM

## 2022-07-16 DIAGNOSIS — R042 Hemoptysis: Principal | ICD-10-CM

## 2022-07-16 MED ORDER — ALLOPURINOL 300 MG TABLET
ORAL_TABLET | Freq: Every day | ORAL | 11 refills | 30 days | Status: CP
Start: 2022-07-16 — End: 2023-07-16

## 2022-07-16 MED ORDER — PREDNISONE 50 MG TABLET
ORAL_TABLET | Freq: Every day | ORAL | 0 refills | 5 days | Status: CP
Start: 2022-07-16 — End: 2023-07-16

## 2022-07-16 MED ORDER — ACALABRUTINIB MALEATE 100 MG TABLET
ORAL_TABLET | Freq: Two times a day (BID) | ORAL | 11 refills | 30 days | Status: CP
Start: 2022-07-16 — End: ?
  Filled 2022-08-10: qty 60, 30d supply, fill #0

## 2022-07-16 MED ORDER — DOXYCYCLINE HYCLATE 100 MG CAPSULE
ORAL_CAPSULE | Freq: Two times a day (BID) | ORAL | 0 refills | 10 days | Status: CP
Start: 2022-07-16 — End: 2022-07-26

## 2022-07-17 DIAGNOSIS — C9110 Chronic lymphocytic leukemia of B-cell type not having achieved remission: Principal | ICD-10-CM

## 2022-07-18 DIAGNOSIS — B009 Herpesviral infection, unspecified: Principal | ICD-10-CM

## 2022-07-18 MED ORDER — VALACYCLOVIR 500 MG TABLET
ORAL_TABLET | Freq: Two times a day (BID) | ORAL | 0 refills | 10 days | Status: CP
Start: 2022-07-18 — End: 2022-07-28

## 2022-07-24 ENCOUNTER — Ambulatory Visit: Admit: 2022-07-24 | Payer: PRIVATE HEALTH INSURANCE

## 2022-07-31 ENCOUNTER — Institutional Professional Consult (permissible substitution): Payer: Managed Care, Other (non HMO) | Admitting: Emergency Medicine

## 2022-08-05 ENCOUNTER — Institutional Professional Consult (permissible substitution): Payer: Managed Care, Other (non HMO) | Admitting: Student in an Organized Health Care Education/Training Program

## 2022-08-06 ENCOUNTER — Institutional Professional Consult (permissible substitution): Admit: 2022-08-06 | Discharge: 2022-08-06 | Payer: PRIVATE HEALTH INSURANCE

## 2022-08-06 ENCOUNTER — Ambulatory Visit: Admit: 2022-08-06 | Discharge: 2022-08-06 | Payer: PRIVATE HEALTH INSURANCE

## 2022-08-06 DIAGNOSIS — D849 Immunodeficiency, unspecified: Principal | ICD-10-CM

## 2022-08-06 DIAGNOSIS — C9110 Chronic lymphocytic leukemia of B-cell type not having achieved remission: Principal | ICD-10-CM

## 2022-08-06 DIAGNOSIS — R042 Hemoptysis: Principal | ICD-10-CM

## 2022-08-06 DIAGNOSIS — B009 Herpesviral infection, unspecified: Principal | ICD-10-CM

## 2022-08-06 LAB — COMPREHENSIVE METABOLIC PANEL
ALBUMIN: 3.8 g/dL (ref 3.4–5.0)
ALKALINE PHOSPHATASE: 94 U/L (ref 46–116)
ALT (SGPT): 10 U/L (ref 10–49)
ANION GAP: 5 mmol/L (ref 5–14)
AST (SGOT): 18 U/L (ref <=34)
BILIRUBIN TOTAL: 1.3 mg/dL — ABNORMAL HIGH (ref 0.3–1.2)
BLOOD UREA NITROGEN: 16 mg/dL (ref 9–23)
BUN / CREAT RATIO: 10
CALCIUM: 9 mg/dL (ref 8.7–10.4)
CHLORIDE: 110 mmol/L — ABNORMAL HIGH (ref 98–107)
CO2: 26.6 mmol/L (ref 20.0–31.0)
CREATININE: 1.58 mg/dL — ABNORMAL HIGH
EGFR CKD-EPI (2021) MALE: 49 mL/min/{1.73_m2} — ABNORMAL LOW (ref >=60)
GLUCOSE RANDOM: 135 mg/dL (ref 70–179)
POTASSIUM: 4.3 mmol/L (ref 3.4–4.8)
PROTEIN TOTAL: 6.6 g/dL (ref 5.7–8.2)
SODIUM: 142 mmol/L (ref 135–145)

## 2022-08-06 LAB — CBC W/ AUTO DIFF
BASOPHILS ABSOLUTE COUNT: 0.1 10*9/L (ref 0.0–0.1)
BASOPHILS RELATIVE PERCENT: 0.1 %
EOSINOPHILS ABSOLUTE COUNT: 0.3 10*9/L (ref 0.0–0.5)
EOSINOPHILS RELATIVE PERCENT: 0.3 %
HEMATOCRIT: 36.7 % — ABNORMAL LOW (ref 39.0–48.0)
HEMOGLOBIN: 11.4 g/dL — ABNORMAL LOW (ref 12.9–16.5)
LYMPHOCYTES ABSOLUTE COUNT: 120.8 10*9/L — ABNORMAL HIGH (ref 1.1–3.6)
LYMPHOCYTES RELATIVE PERCENT: 97.5 %
MEAN CORPUSCULAR HEMOGLOBIN CONC: 31.1 g/dL — ABNORMAL LOW (ref 32.0–36.0)
MEAN CORPUSCULAR HEMOGLOBIN: 28.9 pg (ref 25.9–32.4)
MEAN CORPUSCULAR VOLUME: 92.9 fL (ref 77.6–95.7)
MEAN PLATELET VOLUME: 7.4 fL (ref 6.8–10.7)
MONOCYTES ABSOLUTE COUNT: 1 10*9/L — ABNORMAL HIGH (ref 0.3–0.8)
MONOCYTES RELATIVE PERCENT: 0.8 %
NEUTROPHILS ABSOLUTE COUNT: 1.5 10*9/L — ABNORMAL LOW (ref 1.8–7.8)
NEUTROPHILS RELATIVE PERCENT: 1.3 %
NUCLEATED RED BLOOD CELLS: 1 /100{WBCs} (ref <=4)
PLATELET COUNT: 76 10*9/L — ABNORMAL LOW (ref 150–450)
RED BLOOD CELL COUNT: 3.95 10*12/L — ABNORMAL LOW (ref 4.26–5.60)
RED CELL DISTRIBUTION WIDTH: 17.7 % — ABNORMAL HIGH (ref 12.2–15.2)
WBC ADJUSTED: 123.7 10*9/L (ref 3.6–11.2)

## 2022-08-06 LAB — POTASSIUM, WHOLE BLOOD: POTASSIUM WHOLE BLOOD: 4.2 mmol/L (ref 3.4–4.6)

## 2022-08-06 LAB — PERIPHERAL BLOOD SMEAR, PATH REVIEW

## 2022-08-06 MED ORDER — VALACYCLOVIR 500 MG TABLET
ORAL_TABLET | Freq: Every day | ORAL | 3 refills | 30 days | Status: CP
Start: 2022-08-06 — End: ?

## 2022-08-06 NOTE — Unmapped (Signed)
ScottScott Fox is a 67 y.o. 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 He has been feeling alright with good energy, stable appetite and fluid intake. He denies bleeding, bruising, nausea, vomiting, constipation, diarrhea. He has stopped taking allopurinol but instructed him to restart at this time. 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 Reviewed and updated home medications, no refills needed at this time.     Acalabrutinib (Calquence) Education    Dosing/administration:   100 mg (1 capsule) PO every 12 hours with or without food     Missed dose information discussed: If a dose is missed or not taken at the usual time, administer the dose as soon as possible as log as within 3 hours prior to the next scheduled dose. If >3 hours, take at next scheduled time.      Side effects and warnings/precautions discussed (including but not limited to): complications of myelosuppression (bleeding, infection, fevers, fatigue), diarrhea, headaches, arthralgias/myalgias, fatigue, bruising, rash/skin changes, nausea. Also discussed risk of second primary malignancies and atrial fibrillation. A corresponding management plan for these adverse effects was also shared with the patient. Instructions on when to contact our care team were discussed with the patient.     Drug interactions discussed: Grapefruit or grapefruit juice may interact with acalabrutinib; avoid strong CYP3A4 inducers and moderate/strong CYP3A4 inhibitors may require adjustments. Medication list reviewed in Epic. The patient was instructed to inform the care team before taking any new medications or supplements.      Storage, handling, and disposal information discussed: Oral chemotherapy handling precautions reviewed. Store at room temperature, in a dry location, away from light. Keep out of reach of others including children and pets. Keep stored in originally provided packaging until the medication is ready to be taken. Do not flush unused medication down the toilet or sink or throw away in household trash; throw away safely by returning unused medication to [facility] or to a local take-back program for proper disposal.     Adherence discussed: Stressed the importance of taking medication as prescribed and to contact provider if that changes at any time.     Labs (08/06/22): WBC 123.7, Hgb 11.4, Plts 76, ALC 120.8, ANC 1.5; CMP with SCr at 1.58 (stable), Tbili downtrending to 1.3, AST/ALT WNL  BP 102/75, HR 64    Oncologic History:  Hematology/Oncology History    No history exists.       Weight and Vitals:  Wt Readings from Last 3 Encounters:   07/16/22 (!) 109.8 kg (242 lb 1 oz)   07/02/22 (!) 109.1 kg (240 lb 10.1 oz)   05/28/21 (!) 112.5 kg (248 lb)     Temp Readings from Last 3 Encounters:   07/16/22 36.2 ??C (97.2 ??F) (Temporal)   07/02/22 36.7 ??C (98.1 ??F) (Temporal)   05/28/21 36.6 ??C (97.9 ??F)     BP Readings from Last 3 Encounters:   07/02/22 109/61   05/28/21 130/79   05/07/21 133/87     Pulse Readings from Last 3 Encounters:   07/16/22 69  07/02/22 64   05/28/21 77       Pertinent Labs:  No visits with results within 1 Day(s) from this visit.   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 Swallow whole with water. Do not to chew, crush, dissolve, or cut tablets. 60 tablet 11    allopurinol (ZYLOPRIM) 300 MG tablet Take 1 tablet (300 mg total) by mouth daily. 30 tablet 11    atorvastatin (LIPITOR) 80 MG tablet Take 1 tablet (80 mg total) by mouth daily.      glipiZIDE (GLUCOTROL XL) 10 MG 24 hr tablet Take 1 tablet (10 mg total) by mouth.      predniSONE (DELTASONE) 50 MG tablet Take 1 tablet (50 mg total) by mouth daily. 5 tablet 0    XARELTO 20 mg tablet        No current facility-administered medications for this visit.     Adherence: no barriers identified     Assessment: ScottWooding is a 68 y.o. 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 Patient verbalized understanding of the plan below.     Plan:   CLL  - START acalabrutinib 100 mg BID as soon as it is delivered from [facility] (anticipate delivery on 08/08/22)  - RESTART allopurinol 300 mg daily   - CPP telephone call in ~3 weeks (requested)  - RTC for labs and visit with Dr. Barbette Merino on 09/17/22    Infection Prophylaxis  - START Valtrex 500 mg daily for ppx (previously finished treatment Valtrex course for HSV2 lesion)    CV  - Continue Xarelto 20 mg daily (instructed patient to monitor closely for bleeding when starting acalabrutinib)  - Continue Lipitor 80 mg daily     F/u:  Future Appointments   Date Time Provider Department Center   08/06/2022 12:00 PM [facility] [address] [facility]   08/06/2022 12:30 PM ONCHEM LEUKEMIA PHARMACIST [id number]   08/06/2022  1:00 PM Linna Hoff, MD [facility]   10/01/2022  9:00 AM [facility] [address] [facility]   10/01/2022  9:30 AM Linna Hoff, MD [facility]     The patient reports they are physically located in [place] and is currently: not at home. I conducted a phone visit.  I spent 10 minutes on the phone call with the patient on the date of service .     Okey Regal, PharmD, BCOP, CPP  Acute Leukemia Clinical Pharmacist Practitioner

## 2022-08-06 NOTE — Unmapped (Signed)
[facility] Specialty Medication Onboarding    Specialty Medication: Calquence  Prior Authorization: Approved   Financial Assistance: No - copay  <$25  Final Copay/Day Supply: $35 / 30    Insurance Restrictions: None     Notes to Pharmacist:     The triage team has completed the benefits investigation and has determined that the patient is able to fill this medication at [facility]. Please contact the patient to complete the onboarding or follow up with the prescribing physician as needed.

## 2022-08-06 NOTE — Unmapped (Signed)
TC to check on patient as he missed his virtual appt last Friday. No answer, left VM.     Thanks     [employer]

## 2022-08-06 NOTE — Unmapped (Signed)
[facility]    Patient Name: Scott Fox.  Patient Age: 67 y.o.  Encounter Date: 08/06/2022    Primary Care Provider:  Dione Housekeeper, MD    Referring Physician:  Grant Ruts*      REASON FOR VISIT     67 y.o. male who is seen in follow up for CLL.    ASSESSMENT     Diagnosis: CLL  Risk stratification:  - IGHV: mutated [06/07/18]  - Normal cytogenetics: negative FISH panel [06/07/18], normal karyotype [08/19/18]  - TP53: unmutated [08/19/18]  - Stage: Rai stage IV: lymphocytosis + thrombocytopenia (<100)    Presentation: Leukocytosis noted in 2004  Treatment history:   0) 2004-Present: Observation    Current treatment: None    Other Problems:  Recurrent VTEs  CKD  Chronic diarrhea  Recurrent VTE  Hyperkalemia, mild, favoring pseudohyperkalemia in setting of hyperleukocytosis    CLL:  Thrombocytopenia:  Anemia, improved:  Hemoptysis, scant:  Herpes labialis:  Scott Fox is a 67 y.o. man with a long history of CLL initially diagnosed ~ 2004, on observation since that time but lost to follow up in the [facility] system in 2020.  He re-established with a new PCP in 04/2021, at which time he was noted to have hyperleukocytosis and modest thrombocytopenia.     Previously seen for acute visit for marked facial / neck swelling. 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 He has a large nodal conglomerate under his jaw.  This may be partly a manifestation of local inflammation / infection; however, given he has other indication for treatment (thrombocytopenia, splenomegaly), I think we should start CLL therapy expeditiously.    He received a course of prednisone, valtrex, and doxycycline. We also gave a short course of steroids to manage his LAD / swelling.  - Valtrex 1000 mg BID x 10 days (starting 4/6)  - Doxycycline x 10 days (starting 4/4)  - Prednisone 50 mg x 5 days (4/4-4/9)    His lip lesion is improved, though his LAD is essentially unchanged under his chin, while axillary and inguinal LAD has shrunk down with steroids.    He relates to hemoptysis, which began when he resumed Rivaroxaban. However, there is not a clear lesion to explain why he has hemoptysis, which would not be expected on a DOAC alone. When recently seen in an outside ED, he had CTA obtained without evidence of PE per outside report. I had hoped to have evaluation by pulmonology before potentially starting BTKi, but given his worsening symptoms but stable hemoglobin, I think we should proceed with therapy anyway. Fortunately, this symptoms has resolved.    Hyperkalemia:  Given stable renal function and hyperleukocytosis, consistent with pseudohyperkalemia.    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 Recommended CT urogram and urology evaluation. CT urogram reassuring. He has not yet seen urology.     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  He has been maintained on Coumadin.  He is scheduled for consultation with the benign hematology/thrombosis clinic.  This history may complicate treatment with a BTK inhibitor for CLL.    Chronic diarrhea:   Patient reports a multiyear history of loose stools or diarrhea following oral intake.  He states this symptom has not changed recently, though he request a  GI referral for further evaluation.  I think this is reasonable we will place that referral.    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 While enhanced screening is not recommended, standard screening guidelines should be closely followed.  Annual dermatology evaluation, due to increased risk of non-melanomatous skin cancers.   If needed, blood products for transfusion should be irradiated.  Risk of Hypogammaglobulinemia: No history of recurrent infections.  Vaccination  Avoid all live vaccines.  Recommend annual influenza vaccine.  Recommend pneumococcal vaccine series.  Recommend Zoster vaccine recombinant, adjuvanted (Shingrix).  Antimicrobial prophylaxis:   Antimicrobials: None and Valtrex  GCSF: No  Access / Line: Peripheral  Interim bloodwork: none    COVID status:  - Vaccines received: 3 vaccines. Recommend Bivalent booster.  - COVID infection and treatments received: None    We discussed that, given his disease and immunosuppressive therapy, the efficacy of vaccination is likely not as good as in people without these disorders. Hence there's still the need to be cautious.      PLAN     - Plan for first-line CLL therapy with Acalabrutinib.   - Continue Allopurinol.   - Start Valtrex prophylaxis.  - Pulmonology referral. Scheduled for [facility] consultation 07/29/22, though he missed this appointment.    RTC in 6 weeks. Pharmacy follow up via phone in 3 weeks.    Laurian Brim, MD  Division of Hematology        INTERVAL HISTORY:     Last seen 07/16/22. Seen for acute visit for marked facial / neck swelling. 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 He has a large nodal conglomerate under his jaw. 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    Managed with:  - Valtrex 1000 mg BID x 10 days (starting 4/6)  - Doxycycline x 10 days (starting 4/4)  - Prednisone 50 mg x 5 days (4/4-4/9)    With this intervention, he has noted marked improvement in his lip lesion, with minimal residual redness. He thinks LAD is about the same.  He reports that his hemoptysis has resolved.      HEMATOLOGICAL / ONCOLOGICAL HISTORY:     Hematology/Oncology History    No history exists.       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    MEDICATIONS:     Outpatient Encounter Medications as of 08/06/2022   Medication Sig Dispense Refill    acalabrutinib (CALQUENCE) 100 mg tablet Take 1 tablet (100 mg total) by mouth two (2) times a day. Swallow whole with water. Do not to chew, crush, dissolve, or cut tablets. 60 tablet 11    allopurinol (ZYLOPRIM) 300 MG tablet Take 1 tablet (300 mg total) by mouth daily. 30 tablet 11    atorvastatin (LIPITOR) 80 MG tablet Take 1 tablet (80 mg total) by mouth daily.      [EXPIRED] doxycycline (VIBRAMYCIN) 100 MG capsule  Take 1 capsule (100 mg total) by mouth two (2) times a day for 10 days. 20 capsule 0    glipiZIDE (GLUCOTROL XL) 10 MG 24 hr tablet Take 1 tablet (10 mg total) by mouth.      predniSONE (DELTASONE) 50 MG tablet Take 1 tablet (50 mg total) by mouth daily. 5 tablet 0    [EXPIRED] valACYclovir (VALTREX) 500 MG tablet Take 2 tablets (1,000 mg total) by mouth two (2) times a day for 10 days. 40 tablet 0    XARELTO 20 mg tablet        No facility-administered encounter medications on file as of 08/06/2022.       SOCIAL HISTORY:      Social History     Socioeconomic History    Marital status: Married   Tobacco Use    Smoking status: Never    Smokeless tobacco: Never   Vaping Use    Vaping status: Never Used   Substance and Sexual Activity    Alcohol use: Not Currently     Alcohol/week: 26.0 standard drinks of alcohol     Types: 14 Cans of beer, 12 Shots of liquor per week     Comment: weekend consumption only     Drug use: Never     Social Determinants of Health     Financial Resource Strain: Low Risk  (09/26/2019)    Overall Financial Resource Strain (CARDIA)     Difficulty of Paying Living Expenses: Not hard at all   Food Insecurity: No Food Insecurity (09/26/2019) Hunger Vital Sign     Worried About Running Out of Food in the Last Year: Never true     Ran Out of Food in the Last Year: Never true   Transportation Needs: No Transportation Needs (09/26/2019)    PRAPARE - Therapist, art (Medical): No     Lack of Transportation (Non-Medical): No    Received from [facility]    Social Network        FAMILY HISTORY:     No blood disorders, sudden cardiac death or unexplained cardiac disease    Brother with colon cancer.    REVIEW OF SYSTEMS:   ROS completed and negative except as noted in the interval history.         VITAL SIGNS:     BSA: There is no height or weight on file to calculate BSA.  There were no vitals taken for this visit.    PHYSICAL EXAM:     ECOG Performance Status: 1    GENERAL:  The patient appears well and in no obvious distress.  EYES: EOMI, no scleral icterus.   ZO:XWRUEAV rate and rhythm with no murmurs, rubs or gallops.  RESP:Clear to auscultation bilaterally.  WU:JWJXBJYNWGNF, nontender with normoactive bowel sounds. + splenomegaly.  MSK: No edema. No palpable abnormalities.  LYMPH: No palpable inguinal LAD. No palpable axillary LAD. ~ 5 cm submandibular nodal conglomerate.  DERM: lip/facial lesion as below.  NEURO: A&Ox4  PSYCH: Normal affect    LABS:     WBC   Date Value Ref Range Status   07/16/2022 123.9 (HH) 3.6 - 11.2 10*9/L Final     Absolute Neutrophils   Date Value Ref Range Status   07/16/2022 4.3 1.8 - 7.8 10*9/L Final     Absolute Lymphocytes   Date Value Ref Range Status   07/16/2022 117.6 (H) 1.1 - 3.6 10*9/L Final     HGB  Date Value Ref Range Status   07/16/2022 11.7 (L) 12.9 - 16.5 g/dL Final     HCT   Date Value Ref Range Status   07/16/2022 36.3 (L) 39.0 - 48.0 % Final     MCV   Date Value Ref Range Status   07/16/2022 91.7 77.6 - 95.7 fL Final     Platelet   Date Value Ref Range Status   07/16/2022 76 (L) 150 - 450 10*9/L Final     Sodium   Date Value Ref Range Status   07/16/2022 137 135 - 145 mmol/L Final Potassium   Date Value Ref Range Status   07/16/2022 6.1 (HH) 3.4 - 4.8 mmol/L Final     Chloride   Date Value Ref Range Status   07/16/2022 109 (H) 98 - 107 mmol/L Final     CO2   Date Value Ref Range Status   07/16/2022 26.0 20.0 - 31.0 mmol/L Final     BUN   Date Value Ref Range Status   07/16/2022 15 9 - 23 mg/dL Final     Creatinine   Date Value Ref Range Status   07/16/2022 1.60 (H) 0.73 - 1.18 mg/dL Final     Glucose   Date Value Ref Range Status   07/16/2022 104 70 - 179 mg/dL Final     Calcium   Date Value Ref Range Status   07/16/2022 8.8 8.7 - 10.4 mg/dL Final     Magnesium   Date Value Ref Range Status   05/07/2021 1.7 1.6 - 2.6 mg/dL Final     Albumin   Date Value Ref Range Status   07/16/2022 4.0 3.4 - 5.0 g/dL Final     Total Protein   Date Value Ref Range Status   07/16/2022 7.1 5.7 - 8.2 g/dL Final     Total Bilirubin   Date Value Ref Range Status   07/16/2022 1.7 (H) 0.3 - 1.2 mg/dL Final     AST   Date Value Ref Range Status   07/16/2022 24 <=34 U/L Final     ALT   Date Value Ref Range Status   07/16/2022 7 (L) 10 - 49 U/L Final     Alkaline Phosphatase   Date Value Ref Range Status   07/16/2022 98 46 - 116 U/L Final     Hep B S Ab   Date Value Ref Range Status   05/28/2021 Nonreactive Nonreactive, [place] Final     Hep B Core Total Ab   Date Value Ref Range Status   05/28/2021 Reactive (A) Nonreactive Final     Hepatitis C Ab   Date Value Ref Range Status   05/28/2021 Reactive (A) Nonreactive Final

## 2022-08-06 NOTE — Unmapped (Signed)
Critical value - WBC 123.7lbs - reported to provider.

## 2022-08-06 NOTE — Unmapped (Signed)
[facility]   Patient Onboarding/Medication Counseling    Scott Fox is a 67 y.o. male with CLL who I am counseling today on initiation of therapy.  I am speaking to the patient.    Was a Nurse, learning disability used for this call? No    Verified patient's date of birth / HIPAA.    Specialty medication(s) to be sent: Hematology/Oncology: Calquence    Non-specialty medications/supplies to be sent: none    Medications not needed at this time: none     Calquence (acalabrutinib)    The patient declined counseling on missed dose instructions, goals of therapy, side effects and monitoring parameters, warnings and precautions, drug/food interactions, and storage, handling precautions, and disposal because they were counseled in clinic. The information in the declined sections below are for informational purposes only and was not discussed with patient.     Medication & Administration     Dosage: Take one 100 mg tablet by mouth every 12 hours    Administration:   Take with or without food, approximately 12 hours apart.   Swallow tablet whole with a full glass of water; do not open, break, or chew capsules.     Adherence/Missed dose instructions: Take a missed dose as soon as you think about it. If it has been more than 3 hours since the missed dose, skip the missed dose and go back to your normal time.  Do not take 2 doses at the same time or extra doses.    Goals of Therapy     To bring about and prolong progression free remission.  To improve the patient's quality of life.    Side Effects & Monitoring Parameters      Muscle pain, back pain, bone pain, joint pain, neck pain   Headache   Fatigue, loss of strength and energy   Common cold symptoms   Diarrhea   Nausea, vomiting   Abdominal pain   Constipation    The following side effects should be reported to the provider:  Signs of infection (fever >100.4, chills, mouth sores, sputum production)  Signs of bleeding (vomiting or coughing up blood, blood that looks like coffee grounds, blood in the urine or black, red tarry stools, bruising that gets bigger without reason, any persistent or severe bleeding)  Severe headache, dizziness or passing out  Severe chest pain, fast or abnormal heartbeat, shortness of breath  Weakness on 1 side of the body, trouble speaking or thinking, change in balance, drooping on one side of the face, or blurred eyesight.  Signs of anaphylaxis (wheezing, chest tightness, swelling of face, lips, tongue or throat)    Monitoring Parameters:  CBC (was monitored monthly in studies).   Evaluate pregnancy status prior to use in females of reproductive potential.   Atrial fibrillation and atrial flutter  Signs/symptoms of bleeding (in patients receiving antiplatelet or anticoagulant therapies), infection, and secondary malignancies  Adherence    Contraindications, Warnings, & Precautions     Bone marrow suppression: Grade 3 or 4 cytopenias including neutropenia, anemia, and thrombocytopenia have occurred in patients with hematologic malignancies treated with acalabrutinib (as a single agent)  Cardiovascular adverse effects: Atrial fibrillation and atrial flutter (any grade) occurred in a small percentage of patients with hematologic malignancies treated with acalabrutinib (as a single agent); grade 3 events were reported.  Hemorrhage: Serious hemorrhagic events (some fatal) have been reported in patients with hematologic malignancies who received acalabrutinib.   Infection: Serious bacterial, viral, or fungal infections (including fatal  events and opportunistic infections) have occurred in patients with hematologic malignancies treated with acalabrutinib (as a single agent).  Secondary malignancies: Second primary malignancies, including non-skin carcinomas, have occurred in about one-tenth of patients with hematologic malignancies treated with acalabrutinib (as a single agent); the most frequent second primary malignancy was skin cancer.     Drug/Food Interactions     Medication list reviewed in Epic. The patient was instructed to inform the care team before taking any new medications or supplements. No drug interactions identified.   Avoid concomitant use with proton pump inhibitors  Administer 2 hours prior to H2-receptor antagonists and separate from antacids by at least 2 hours.  Administration with a high-fat, high-calorie meal results in Cmax decreased by 73% and Tmax delayed 1 to 2 hours.  Avoid live vaccines  No grapefruit / grapefruit juice or [place]ville oranges    Storage, Handling Precautions, & Disposal     Store at room temperature in the original container (do not use a pillbox or store with other medications).   Caregivers helping administer medication should wear gloves and wash hands immediately after.    Keep the lid tightly closed. Keep out of the reach of children and pets.  Do not flush down a toilet or pour down a drain unless instructed to do so.  Check with your local police department or fire station about drug take-back programs in your area.       Current Medications (including OTC/herbals), Comorbidities and Allergies     Current Outpatient Medications   Medication Sig Dispense Refill    acalabrutinib (CALQUENCE) 100 mg tablet Take 1 tablet (100 mg total) by mouth two (2) times a day. Swallow whole with water. Do not to chew, crush, dissolve, or cut tablets. 60 tablet 11    allopurinol (ZYLOPRIM) 300 MG tablet Take 1 tablet (300 mg total) by mouth daily. 30 tablet 11    atorvastatin (LIPITOR) 80 MG tablet Take 1 tablet (80 mg total) by mouth daily.      glipiZIDE (GLUCOTROL XL) 10 MG 24 hr tablet Take 1 tablet (10 mg total) by mouth.      valACYclovir (VALTREX) 500 MG tablet Take 1 tablet (500 mg total) by mouth daily. 30 tablet 3    XARELTO 20 mg tablet        No current facility-administered medications for this visit.       No Known Allergies    Patient Active Problem List   Diagnosis    Chronic lymphocytic leukemia not having achieved remission (CMS-HCC)    Chest pain    History of recurrent deep vein thrombosis (DVT)    Chronic anticoagulation    Subtherapeutic international normalized ratio (INR)    T2DM (type 2 diabetes mellitus) (CMS-HCC)    HLD (hyperlipidemia)       Reviewed and up to date in Epic.    Appropriateness of Therapy     Acute infections noted within Epic:  No active infections  Patient reported infection: None    Is medication and dose appropriate based on diagnosis and infection status? Yes    Prescription has been clinically reviewed: Yes    Patient-Reported Symptoms Tracker for Cancer Patients on Oral Chemotherapy     Oral chemotherapy medication name(s): Calquence  Dose and frequency: 100 mg twice daily  Oral Chemotherapy Start Date:    Baseline? Yes  Clinic(s) visited: Hematology    Symptom Grouping Question Patient Response   Digestion and Eating Have you felt sick to  your stomach? Denies    Had diarrhea? Denies    Constipated? Denies    Not wanting to eat? Denies    Comments      Sleep and Pain Felt very tired even after you rest? Denies    Pain due to cancer medication or cancer? Denies    Comments     Other Side Effects Numbness or tingling in hands and/or feet? Denies    Felt short of breath? Denies    Mouth or throat Sores? Denies    Rash? Denies    Palmar-plantar erythrodysesthesia syndrome?      Rash - acneiform?      Rash - maculo-papular?      How many days over the past month did your cancer medication or cancer keep you from your normal activities?  Write in number of days, 0-30:  0    Other side effects or things you would like to discuss? NA    Comments?     Adherence  In the last 30 days, on how many days did you miss at least one dose of any of your [drug name]? Write in number of days, 0-30:  0    What reasons are you having trouble taking your medication [pharmacist: check all that apply]? Specify chemotherapy cycle:        No problems identified    Comments:        Comments       Optional Symptom Tracking Comments: New start patient    Financial Information     Medication Assistance provided: Prior Authorization    Anticipated copay of $35 / 30 days reviewed with patient. Verified delivery address.    Delivery Information     Scheduled delivery date: 08/08/22    Expected start date: ASAP    Medication will be delivered via Next Day Courier to the prescription address in [facility].  This shipment will not require a signature.      Explained the services we provide at [facility] and that each month we would call to set up refills.  Stressed importance of returning phone calls so that we could ensure they receive their medications in time each month.  Informed patient that we should be setting up refills 7-10 days prior to when they will run out of medication.  A pharmacist will reach out to perform a clinical assessment periodically.  Informed patient that a welcome packet, containing information about our pharmacy and other support services, a Notice of Privacy Practices, and a drug information handout will be sent.      The patient or caregiver noted above participated in the development of this care plan and knows that they can request review of or adjustments to the care plan at any time.      Patient or caregiver verbalized understanding of the above information as well as how to contact the pharmacy at [phone] option 4 with any questions/concerns.  The pharmacy is open Monday through Friday 8:30am-4:30pm.  A pharmacist is available 24/7 via pager to answer any clinical questions they may have.    Patient Specific Needs     Does the patient have any physical, cognitive, or cultural barriers? No    Does the patient have adequate living arrangements? (i.e. the ability to store and take their medication appropriately) Yes    Did you identify any home environmental safety or security hazards? No    Patient prefers to have medications discussed with  Patient  Is the patient or caregiver able to read and understand education materials at a high school level or above? Yes    Patient's primary language is  English     Is the patient high risk? No    SOCIAL DETERMINANTS OF HEALTH     At the [facility], we have learned that life circumstances - like trouble affording food, housing, utilities, or transportation can affect the health of many of our patients.   That is why we wanted to ask: are you currently experiencing any life circumstances that are negatively impacting your health and/or quality of life? No    Social Determinants of Health     Financial Resource Strain: Low Risk  (09/26/2019)    Overall Financial Resource Strain (CARDIA)     Difficulty of Paying Living Expenses: Not hard at all   Internet Connectivity: Not on file   Food Insecurity: No Food Insecurity (09/26/2019)    Hunger Vital Sign     Worried About Running Out of Food in the Last Year: Never true     Ran Out of Food in the Last Year: Never true   Tobacco Use: Low Risk  (07/02/2022)    Patient History     Smoking Tobacco Use: Never     Smokeless Tobacco Use: Never     Passive Exposure: Not on file   Housing/Utilities: Low Risk  (09/26/2019)    Housing/Utilities     Within the past 12 months, have you ever stayed: outside, in a car, in a tent, in an overnight shelter, or temporarily in someone else's home (i.e. couch-surfing)?: No     Are you worried about losing your housing?: No     Within the past 12 months, have you been unable to get utilities (heat, electricity) when it was really needed?: No   Alcohol Use: Not on file   Transportation Needs: No Transportation Needs (09/26/2019)    PRAPARE - Therapist, art (Medical): No     Lack of Transportation (Non-Medical): No   Substance Use: Not on file   Health Literacy: Not on file   Physical Activity: Not on file   Interpersonal Safety: Not on file   Stress: Not on file   Intimate Partner Violence: Unknown (07/16/2021)    Received from [facility]    HITS Physically Hurt: Not on file     Insult or Talk Down To: Not on file     Threaten Physical Harm: Not on file     Scream or Curse: Not on file   Depression: Not at risk (07/16/2022)    PHQ-2     PHQ-2 Score: 0   Social Connections: Unknown (08/24/2021)    Received from [employer]    Social Network     Social Network: Not on file     Would you be willing to receive help with any of the needs that you have identified today? Not applicable       Kermit Balo, [facility]  [facility] Specialty Pharmacist

## 2022-08-08 LAB — SLIDE REVIEW

## 2022-08-26 ENCOUNTER — Institutional Professional Consult (permissible substitution): Admit: 2022-08-26 | Discharge: 2022-08-27 | Payer: PRIVATE HEALTH INSURANCE

## 2022-08-26 NOTE — Unmapped (Signed)
Scott Fox is a 67 y.o. male with CLL who I am seeing in clinic today for oral chemotherapy education    Encounter Date: 08/26/2022    Current Treatment: acalabrutinib 100 mg BID (first dose 08/10/22)    For oral chemotherapy:  Pharmacy: [facility]   Medication Access: $35 copay for 30 DS    Interval History: I spoke with Scott Fox over the phone to follow up and assess tolerability since he started acalabrutinib. He has been feeling well and has been taking acalabrutinib at 6AM and 6PM (first dose 08/10/22), denies any missed doses. He denies bleeding, bruising, nausea, vomiting, constipation, diarrhea, rash, pain. His appetite has been good and he has been drinking well. His facial swelling has improved and he denies any new HSV lesions. He has not started allopurinol yet, instructed him to pick up from pharmacy and start today. Reminded him that [facility] will reach out to confirm address before shipping next month supply of acalabrutinib so he needs to be able to contact them. Home medications reviewed and updated in Epic.     Labs (08/06/22): WBC 123.7, Hgb 11.4, Plts 76, ALC 120.8, ANC 1.5; CMP with SCr at 1.58 (stable), Tbili downtrending to 1.3, AST/ALT WNL    Oncologic History:  Hematology/Oncology History    No history exists.       Weight and Vitals:  Wt Readings from Last 3 Encounters:   08/06/22 (!) 108 kg (238 lb 3.2 oz)   07/16/22 (!) 109.8 kg (242 lb 1 oz)   07/02/22 (!) 109.1 kg (240 lb 10.1 oz)     Temp Readings from Last 3 Encounters:   08/06/22 36.4 ??C (97.5 ??F) (Temporal)   07/16/22 36.2 ??C (97.2 ??F) (Temporal)   07/02/22 36.7 ??C (98.1 ??F) (Temporal)     BP Readings from Last 3 Encounters:   08/06/22 102/75   07/02/22 109/61   05/28/21 130/79     Pulse Readings from Last 3 Encounters:   08/06/22 64   07/16/22 69   07/02/22 64       Pertinent Labs:  No visits with results within 1 Day(s) from this visit.   Latest known visit with results is:   Appointment on 08/06/2022   Component Date Value Ref Range Status    Sodium 08/06/2022 142  135 - 145 mmol/L Final    Potassium 08/06/2022 4.3  3.4 - 4.8 mmol/L Final    Chloride 08/06/2022 110 (H)  98 - 107 mmol/L Final    CO2 08/06/2022 26.6  20.0 - 31.0 mmol/L Final    Anion Gap 08/06/2022 5  5 - 14 mmol/L Final    BUN 08/06/2022 16  9 - 23 mg/dL Final    Creatinine 16/01/9603 1.58 (H)  0.73 - 1.18 mg/dL Final    BUN/Creatinine Ratio 08/06/2022 10   Final    eGFR CKD-EPI (2021) Male 08/06/2022 49 (L)  >=60 mL/min/1.49m2 Final    eGFR calculated with CKD-EPI 2021 equation in accordance with [employer] and [employer] of Nephrology Task Force recommendations.    Glucose 08/06/2022 135  70 - 179 mg/dL Final    Calcium 54/12/8117 9.0  8.7 - 10.4 mg/dL Final    Albumin 14/78/2956 3.8  3.4 - 5.0 g/dL Final    Total Protein 08/06/2022 6.6  5.7 - 8.2 g/dL Final    Total Bilirubin 08/06/2022 1.3 (H)  0.3 - 1.2 mg/dL Final    AST 21/30/8657 18  <=34 U/L Final    ALT  08/06/2022 10  10 - 49 U/L Final    Alkaline Phosphatase 08/06/2022 94  46 - 116 U/L Final    Potassium, Bld 08/06/2022 4.2  3.4 - 4.6 mmol/L Final    WBC 08/06/2022 123.7 (HH)  3.6 - 11.2 10*9/L Final    RBC 08/06/2022 3.95 (L)  4.26 - 5.60 10*12/L Final    HGB 08/06/2022 11.4 (L)  12.9 - 16.5 g/dL Final    HCT 16/01/9603 36.7 (L)  39.0 - 48.0 % Final    MCV 08/06/2022 92.9  77.6 - 95.7 fL Final    MCH 08/06/2022 28.9  25.9 - 32.4 pg Final    MCHC 08/06/2022 31.1 (L)  32.0 - 36.0 g/dL Final    RDW 54/12/8117 17.7 (H)  12.2 - 15.2 % Final    MPV 08/06/2022 7.4  6.8 - 10.7 fL Final    Platelet 08/06/2022 76 (L)  150 - 450 10*9/L Final    nRBC 08/06/2022 1  <=4 /100 WBCs Final    Neutrophils % 08/06/2022 1.3  % Final    Lymphocytes % 08/06/2022 97.5  % Final    Monocytes % 08/06/2022 0.8  % Final    Eosinophils % 08/06/2022 0.3  % Final    Basophils % 08/06/2022 0.1  % Final    Absolute Neutrophils 08/06/2022 1.5 (L)  1.8 - 7.8 10*9/L Final    Absolute Lymphocytes 08/06/2022 120.8 (H)  1.1 - 3.6 10*9/L Final    Absolute Monocytes 08/06/2022 1.0 (H)  0.3 - 0.8 10*9/L Final    Absolute Eosinophils 08/06/2022 0.3  0.0 - 0.5 10*9/L Final    Absolute Basophils 08/06/2022 0.1  0.0 - 0.1 10*9/L Final    Anisocytosis 08/06/2022 Slight (A)  Not Present Final    Smear Review Comments 08/06/2022 See Comment (A)  Undefined Final    Smear Reviewed  Smudge Cells Present      Pathologist Smear Interpretation 08/06/2022 Physician Requested Path Review - PB:  prepared slide and instrument printout routed to hemepath  Confirmed by Hemepath Fellow, Confirmed by Hemepath Specialist/Senior Tech, Confirmed by Core Specialist/Senior Tech, To Be Accessioned - Case Report to Follow, Physician Requested Path Review - BF/CSF: cytospin routed to hemepath, Physician Requested... Final    Diagnosis 08/06/2022    Final                    Value:This result contains rich text formatting which cannot be displayed here.    Diagnosis Comment 08/06/2022    Final                    Value:This result contains rich text formatting which cannot be displayed here.    Clinical History 08/06/2022    Final                    Value:This result contains rich text formatting which cannot be displayed here.    Gross Description 08/06/2022    Final                    Value:This result contains rich text formatting which cannot be displayed here.    Microscopic Description 08/06/2022    Final                    Value:This result contains rich text formatting which cannot be displayed here.    Disclaimer 08/06/2022    Final  Value:This result contains rich text formatting which cannot be displayed here.       Allergies: No Known Allergies    Drug Interactions: no drug interactions identified     Current Medications:  Current Outpatient Medications   Medication Sig Dispense Refill    acalabrutinib (CALQUENCE) 100 mg tablet Take 1 tablet (100 mg total) by mouth two (2) times a day. Swallow whole with water. Do not to chew, crush, dissolve, or cut tablets. 60 tablet 11    allopurinol (ZYLOPRIM) 300 MG tablet Take 1 tablet (300 mg total) by mouth daily. 30 tablet 11    atorvastatin (LIPITOR) 80 MG tablet Take 1 tablet (80 mg total) by mouth daily.      glipiZIDE (GLUCOTROL XL) 10 MG 24 hr tablet Take 1 tablet (10 mg total) by mouth.      valACYclovir (VALTREX) 500 MG tablet Take 1 tablet (500 mg total) by mouth daily. 30 tablet 3    XARELTO 20 mg tablet        No current facility-administered medications for this visit.     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 He has been tolerating therapy well overall and has had no new symptoms. Patient verbalized understanding of the plan below.     Plan:   CLL  - Continue acalabrutinib 100 mg BID as soon as it is delivered from [facility]   - START allopurinol 300 mg daily (will need to pick up from local pharmacy)  - RTC for labs and visit with Dr. 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 I conducted a phone visit.  I spent 10 minutes on the phone call with the patient on the date of service .     Okey Regal, PharmD, BCOP, CPP  Acute Leukemia Clinical Pharmacist Practitioner

## 2022-09-08 DIAGNOSIS — C9110 Chronic lymphocytic leukemia of B-cell type not having achieved remission: Principal | ICD-10-CM

## 2022-09-08 MED ORDER — ALLOPURINOL 300 MG TABLET
ORAL_TABLET | Freq: Every day | ORAL | 4 refills | 90 days
Start: 2022-09-08 — End: ?

## 2022-09-08 NOTE — Unmapped (Signed)
1238 Left voice mail on identified voice mail that he has refills available and they will process one for him to pick [facility] at the [facility] [place] [facility] [address].

## 2022-09-08 NOTE — Unmapped (Signed)
Refill not appropriate, he has refills available at the [facility] in [place] on [address]. Spoke with team there and he has plenty of refill on this script.

## 2022-09-11 NOTE — Unmapped (Signed)
[facility] Clinical Assessment & Refill Coordination Note    Scott Billiter., DOB: 1956-02-21  Phone: [phone] (home)     All above HIPAA information was verified with patient.     Was a Nurse, learning disability used for this call? No    Specialty Medication(s):   Hematology/Oncology: Calquence     Current Outpatient Medications   Medication Sig Dispense Refill    acalabrutinib (CALQUENCE) 100 mg tablet Take 1 tablet (100 mg total) by mouth two (2) times a day. Swallow whole with water. Do not to chew, crush, dissolve, or cut tablets. 60 tablet 11    allopurinol (ZYLOPRIM) 300 MG tablet Take 1 tablet (300 mg total) by mouth daily. 30 tablet 11    atorvastatin (LIPITOR) 80 MG tablet Take 1 tablet (80 mg total) by mouth daily.      glipiZIDE (GLUCOTROL XL) 10 MG 24 hr tablet Take 1 tablet (10 mg total) by mouth.      valACYclovir (VALTREX) 500 MG tablet Take 1 tablet (500 mg total) by mouth daily. 30 tablet 3    XARELTO 20 mg tablet        No current facility-administered medications for this visit.        Changes to medications: Kyndel reports no changes at this time.    No Known Allergies    Changes to allergies: No    SPECIALTY MEDICATION ADHERENCE     Calquence 100 mg: 5 days of medicine on hand     Are there any concerns with adherence? No    Adherence counseling provided? Not needed    Patient-Reported Symptoms Tracker for Cancer Patients on Oral Chemotherapy     Oral chemotherapy medication name(s): Calquence  Dose and frequency: 100 mg twice daily  Oral Chemotherapy Start Date:    Baseline?    Clinic(s) visited: Hematology    Symptom Grouping Question Patient Response   Digestion and Eating Have you felt sick to your stomach? Denies    Had diarrhea? Denies    Constipated? Denies    Not wanting to eat? Denies    Comments      Sleep and Pain Felt very tired even after you rest? Denies    Pain due to cancer medication or cancer? Denies    Comments     Other Side Effects Numbness or tingling in hands and/or feet? Denies    Felt short of breath? Denies    Mouth or throat Sores? Denies    Rash? Denies    Palmar-plantar erythrodysesthesia syndrome?      Rash - acneiform?      Rash - maculo-papular?      How many days over the past month did your cancer medication or cancer keep you from your normal activities?  Write in number of days, 0-30:  0    Other side effects or things you would like to discuss?      Comments?     Adherence  In the last 30 days, on how many days did you miss at least one dose of any of your [drug name]? Write in number of days, 0-30:  0    What reasons are you having trouble taking your medication [pharmacist: check all that apply]? Specify chemotherapy cycle:        No problems identified    Comments:        Comments       Optional Symptom Tracking Comments:      CLINICAL  MANAGEMENT AND INTERVENTION      Clinical Benefit Assessment:    Do you feel the medicine is effective or helping your condition? Yes    Clinical Benefit counseling provided? Not needed    Acute Infection Status:    Acute infections noted within Epic:  No active infections    Patient reported infection: None    Therapy Appropriateness:    Is therapy appropriate and patient progressing towards therapeutic goals? Yes, therapy is appropriate and should be continued    DISEASE/MEDICATION-SPECIFIC INFORMATION      N/A    Is the patient receiving adequate infection prevention treatment? Not applicable    Does the patient have adequate nutritional support? Not applicable    PATIENT SPECIFIC NEEDS     Does the patient have any physical, cognitive, or cultural barriers? No    Is the patient high risk? No    Did the patient require a clinical intervention? No    Does the patient require physician intervention or other additional services (i.e., nutrition, smoking cessation, social work)? No    SOCIAL DETERMINANTS OF HEALTH     At the [facility], we have learned that life circumstances - like trouble affording food, housing, utilities, or transportation can affect the health of many of our patients.   That is why we wanted to ask: are you currently experiencing any life circumstances that are negatively impacting your health and/or quality of life? Patient declined to answer    Social Determinants of Health     Financial Resource Strain: Low Risk  (09/26/2019)    Overall Financial Resource Strain (CARDIA)     Difficulty of Paying Living Expenses: Not hard at all   Internet Connectivity: Not on file   Food Insecurity: No Food Insecurity (09/26/2019)    Hunger Vital Sign     Worried About Running Out of Food in the Last Year: Never true     Ran Out of Food in the Last Year: Never true   Tobacco Use: Low Risk  (07/02/2022)    Patient History     Smoking Tobacco Use: Never     Smokeless Tobacco Use: Never     Passive Exposure: Not on file   Housing/Utilities: Low Risk  (09/26/2019)    Housing/Utilities     Within the past 12 months, have you ever stayed: outside, in a car, in a tent, in an overnight shelter, or temporarily in someone else's home (i.e. couch-surfing)?: No     Are you worried about losing your housing?: No     Within the past 12 months, have you been unable to get utilities (heat, electricity) when it was really needed?: No   Alcohol Use: Not on file   Transportation Needs: No Transportation Needs (09/26/2019)    PRAPARE - Therapist, art (Medical): No     Lack of Transportation (Non-Medical): No   Substance Use: Not on file   Health Literacy: Not on file   Physical Activity: Not on file   Interpersonal Safety: Not on file   Stress: Not on file   Intimate Partner Violence: Unknown (07/16/2021)    Received from [facility]    HITS     Physically Hurt: Not on file     Insult or Talk Down To: Not on file     Threaten Physical Harm: Not on file     Scream or Curse: Not on file   Depression: Not at risk (07/16/2022)  PHQ-2     PHQ-2 Score: 0   Social Connections: Unknown (08/24/2021)    Received from [facility]    Social Network     Social Network: Not on file       Would you be willing to receive help with any of the needs that you have identified today? Not applicable       SHIPPING     Specialty Medication(s) to be Shipped:   Hematology/Oncology: Calquence    Other medication(s) to be shipped: No additional medications requested for fill at this time     Changes to insurance: No    Delivery Scheduled: Yes, Expected medication delivery date: 09/15/22.     Medication will be delivered via Next Day Courier to the confirmed prescription address in [facility].    The patient will receive a drug information handout for each medication shipped and additional FDA Medication Guides as required.  Verified that patient has previously received a Conservation officer, historic buildings and a Surveyor, mining.    The patient or caregiver noted above participated in the development of this care plan and knows that they can request review of or adjustments to the care plan at any time.      All of the patient's questions and concerns have been addressed.    Kermit Balo, [facility]   [facility] Specialty Pharmacist

## 2022-09-14 MED FILL — CALQUENCE (ACALABRUTINIB MALEATE) 100 MG TABLET: ORAL | 30 days supply | Qty: 60 | Fill #1

## 2022-10-07 NOTE — Unmapped (Unsigned)
{THIS IS A PRELIMINARY DRAFT NOTE}  {Update date of service}  {Refresh smart links}    [facility]    Patient Name: Scott Fox.  Patient Age: 67 y.o.  Encounter Date: 10/08/2022    Primary Care Provider:  Dione Housekeeper, MD    Referring Physician:  Grant Ruts*      REASON FOR VISIT     67 y.o. male who is seen in follow up for CLL.    ASSESSMENT     Diagnosis: CLL  Risk stratification:  - IGHV: mutated [06/07/18]  - Normal cytogenetics: negative FISH panel [06/07/18], normal karyotype [08/19/18]  - TP53: unmutated [08/19/18]  - Stage: Rai stage IV: lymphocytosis + thrombocytopenia (<100)    Presentation: Leukocytosis noted in 2004  Treatment history:   0) 2004-07/2022: Observation  1) 08/10/22-Present: Acalabrutinib    ***    Current treatment: First-line therapy - Acalabrutinib [begun 08/10/22]:  - Acalabrutinib 100 mg BID  Toxicities: {OVERALL ACUTE TOXICITY:[phone]}  Therapy interruptions: ***  Treatment course:  Response:  Baseline: *** [{date}]  Best response: *** [{date}]  Recent: *** [{date}]      Other Problems:  Recurrent VTEs  CKD  Chronic diarrhea  Recurrent VTE  Hyperkalemia, mild, favoring pseudohyperkalemia in setting of hyperleukocytosis    CLL:  Thrombocytopenia:  Anemia, improved:  Hemoptysis, scant:  Herpes labialis:  Scott Fox is a 67 y.o. man with a long history of CLL initially diagnosed ~ 2004, on observation since that time but lost to follow up in the [facility] system in 2020.  He re-established with a new PCP in 04/2021, at which time he was noted to have hyperleukocytosis and modest thrombocytopenia.     Previously seen for acute visit for marked facial / neck swelling. 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 He has a large nodal conglomerate under his jaw.  This may be partly a manifestation of local inflammation / infection; however, given he has other indication for treatment (thrombocytopenia, splenomegaly), I think we should start CLL therapy expeditiously.    He received a course of prednisone, valtrex, and doxycycline. We also gave a short course of steroids to manage his LAD / swelling.  - Valtrex 1000 mg BID x 10 days (starting 4/6)  - Doxycycline x 10 days (starting 4/4)  - Prednisone 50 mg x 5 days (4/4-4/9)    His lip lesion is improved, though his LAD is essentially unchanged under his chin, while axillary and inguinal LAD has shrunk down with steroids.    He relates to hemoptysis, which began when he resumed Rivaroxaban. However, there is not a clear lesion to explain why he has hemoptysis, which would not be expected on a DOAC alone. When recently seen in an outside ED, he had CTA obtained without evidence of PE per outside report. I had hoped to have evaluation by pulmonology before potentially starting BTKi, but given his worsening symptoms but stable hemoglobin, I think we should proceed with therapy anyway. Fortunately, this symptoms has resolved.    ***    Hyperkalemia:  Given stable renal function and hyperleukocytosis, consistent with pseudohyperkalemia.    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 Recommended CT urogram and urology evaluation. CT urogram reassuring. He has not yet seen urology.     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  He has been maintained on Coumadin.  He is scheduled for consultation with the benign hematology/thrombosis clinic.  This history may complicate treatment with a BTK inhibitor for CLL.    ***    Chronic diarrhea:   Patient reports a multiyear history of loose stools or diarrhea following oral intake.  He states this symptom has not changed recently, though he request a GI referral for further evaluation.  I think this is reasonable we will place that referral.    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 While enhanced screening is not recommended, standard screening guidelines should be closely followed.  Annual dermatology evaluation, due to increased risk of non-melanomatous skin cancers.   If needed, blood products for transfusion should be irradiated.  Risk of Hypogammaglobulinemia: No history of recurrent infections.  Vaccination  Avoid all live vaccines.  Recommend annual influenza vaccine.  Recommend pneumococcal vaccine series.  Recommend Zoster vaccine recombinant, adjuvanted (Shingrix).  Antimicrobial prophylaxis:   Antimicrobials: None and Valtrex  GCSF: No  Access / Line: Peripheral  Interim bloodwork: none    COVID status:  - Vaccines received: 3 vaccines. Recommend Bivalent booster.  - COVID infection and treatments received: None    We discussed that, given his disease and immunosuppressive therapy, the efficacy of vaccination is likely not as good as in people without these disorders. Hence there's still the need to be cautious.      PLAN     {From prior:  - Plan for first-line CLL therapy with Acalabrutinib.   - Continue Allopurinol.   - Start Valtrex prophylaxis.  - Pulmonology referral. Scheduled for [facility] consultation 07/29/22, though he missed this appointment.  }    RTC in 6 weeks. Pharmacy follow up via phone in 3 weeks.  ***  Laurian Brim, MD  Division of Hematology        INTERVAL HISTORY:     Last seen 08/06/22, at which time we planned to start Acalabrutinib.    ***    {From prior:    Seen for acute visit for marked facial / neck swelling. 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 He has a large nodal conglomerate under his jaw. 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    Managed with:  - Valtrex 1000 mg BID x 10 days (starting 4/6)  - Doxycycline x 10 days (starting 4/4)  - Prednisone 50 mg x 5 days (4/4-4/9)    With this intervention, he has noted marked improvement in his lip lesion, with minimal residual redness. He thinks LAD is about the same.  He reports that his hemoptysis has resolved.  }    HEMATOLOGICAL / ONCOLOGICAL HISTORY:     Hematology/Oncology History    No history exists.       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    MEDICATIONS:  Outpatient Encounter Medications as of 10/08/2022   Medication Sig Dispense Refill    acalabrutinib (CALQUENCE) 100 mg tablet Take 1 tablet (100 mg total) by mouth two (2) times a day. Swallow whole with water. Do not to chew, crush, dissolve, or cut tablets. 60 tablet 11    allopurinol (ZYLOPRIM) 300 MG tablet Take 1 tablet (300 mg total) by mouth daily. 30 tablet 11    atorvastatin (LIPITOR) 80 MG tablet Take 1 tablet (80 mg total) by mouth daily.      glipiZIDE (GLUCOTROL XL) 10 MG 24 hr tablet Take 1 tablet (10 mg total) by mouth.      valACYclovir (VALTREX) 500 MG tablet Take 1 tablet (500 mg total) by mouth daily. 30 tablet 3    XARELTO 20 mg tablet        No facility-administered encounter medications on file as of 10/08/2022.       SOCIAL HISTORY:      Social History     Socioeconomic History    Marital status: Married   Tobacco Use    Smoking status: Never    Smokeless tobacco: Never   Vaping Use    Vaping status: Never Used   Substance and Sexual Activity    Alcohol use: Not Currently     Alcohol/week: 26.0 standard drinks of alcohol     Types: 14 Cans of beer, 12 Shots of liquor per week     Comment: weekend consumption only     Drug use: Never Social Determinants of Health     Financial Resource Strain: Low Risk  (09/26/2019)    Overall Financial Resource Strain (CARDIA)     Difficulty of Paying Living Expenses: Not hard at all   Food Insecurity: No Food Insecurity (09/26/2019)    Hunger Vital Sign     Worried About Running Out of Food in the Last Year: Never true     Ran Out of Food in the Last Year: Never true   Transportation Needs: No Transportation Needs (09/26/2019)    PRAPARE - Therapist, art (Medical): No     Lack of Transportation (Non-Medical): No    [facility]    Social Network        FAMILY HISTORY:     No blood disorders, sudden cardiac death or unexplained cardiac disease    Brother with colon cancer.    REVIEW OF SYSTEMS:   ROS completed and negative except as noted in the interval history.         VITAL SIGNS:     BSA: There is no height or weight on file to calculate BSA.  There were no vitals taken for this visit.    PHYSICAL EXAM:     ECOG Performance Status: 1  ***    GENERAL:  The patient appears well and in no obvious distress.  EYES: EOMI, no scleral icterus.   HY:QMVHQIO rate and rhythm with no murmurs, rubs or gallops.  RESP:Clear to auscultation bilaterally.  NG:EXBMWUXLKGMW, nontender with normoactive bowel sounds. + splenomegaly.  MSK: No edema. No palpable abnormalities.  LYMPH: No palpable inguinal LAD. No palpable axillary LAD. ~ 5 cm submandibular nodal conglomerate.  DERM: lip/facial lesion as below.  NEURO: A&Ox4  PSYCH: Normal affect    LABS:     WBC   Date Value Ref Range Status   08/06/2022 123.7 (HH) 3.6 - 11.2 10*9/L Final     Absolute Neutrophils   Date Value  Ref Range Status   08/06/2022 1.5 (L) 1.8 - 7.8 10*9/L Final     Absolute Lymphocytes   Date Value Ref Range Status   08/06/2022 120.8 (H) 1.1 - 3.6 10*9/L Final     HGB   Date Value Ref Range Status   08/06/2022 11.4 (L) 12.9 - 16.5 g/dL Final     HCT   Date Value Ref Range Status   08/06/2022 36.7 (L) 39.0 - 48.0 % Final     MCV   Date Value Ref Range Status   08/06/2022 92.9 77.6 - 95.7 fL Final     Platelet   Date Value Ref Range Status   08/06/2022 76 (L) 150 - 450 10*9/L Final     Sodium   Date Value Ref Range Status   08/06/2022 142 135 - 145 mmol/L Final     Potassium   Date Value Ref Range Status   08/06/2022 4.3 3.4 - 4.8 mmol/L Final     Potassium, Bld   Date Value Ref Range Status   08/06/2022 4.2 3.4 - 4.6 mmol/L Final     Chloride   Date Value Ref Range Status   08/06/2022 110 (H) 98 - 107 mmol/L Final     CO2   Date Value Ref Range Status   08/06/2022 26.6 20.0 - 31.0 mmol/L Final     BUN   Date Value Ref Range Status   08/06/2022 16 9 - 23 mg/dL Final     Creatinine   Date Value Ref Range Status   08/06/2022 1.58 (H) 0.73 - 1.18 mg/dL Final     Glucose   Date Value Ref Range Status   08/06/2022 135 70 - 179 mg/dL Final     Calcium   Date Value Ref Range Status   08/06/2022 9.0 8.7 - 10.4 mg/dL Final     Magnesium   Date Value Ref Range Status   05/07/2021 1.7 1.6 - 2.6 mg/dL Final     Albumin   Date Value Ref Range Status   08/06/2022 3.8 3.4 - 5.0 g/dL Final     Total Protein   Date Value Ref Range Status   08/06/2022 6.6 5.7 - 8.2 g/dL Final     Total Bilirubin   Date Value Ref Range Status   08/06/2022 1.3 (H) 0.3 - 1.2 mg/dL Final     AST   Date Value Ref Range Status   08/06/2022 18 <=34 U/L Final     ALT   Date Value Ref Range Status   08/06/2022 10 10 - 49 U/L Final     Alkaline Phosphatase   Date Value Ref Range Status   08/06/2022 94 46 - 116 U/L Final     Hep B S Ab   Date Value Ref Range Status   05/28/2021 Nonreactive Nonreactive, [place] Final     Hep B Core Total Ab   Date Value Ref Range Status   05/28/2021 Reactive (A) Nonreactive Final     Hepatitis C Ab   Date Value Ref Range Status   05/28/2021 Reactive (A) Nonreactive Final

## 2022-10-08 NOTE — Unmapped (Signed)
Message was left on patient's voicemail with identifier, asking for call back to r/s his appointment.

## 2022-10-08 NOTE — Unmapped (Signed)
TC to patient as he has missed his appts for today. No answer, left VM.    Thanks    [employer]

## 2022-10-21 NOTE — Unmapped (Signed)
The [facility] has made a second and final attempt to reach this patient to refill the following medication:CALQUENCE 100 mg tablet (acalabrutinib).      We have left voicemails on the following phone numbers: [phone], have sent a text message to the following phone numbers: [phone], and have sent a Mychart questionnaire..    Dates contacted: 10/06/22 and  10/20/22   Last scheduled delivery: 09/14/22    The patient may be at risk of non-compliance with this medication. The patient should call the [facility] at [phone]  Option 4, then Option 1: Oncology to refill medication.    Ricci Barker   [facility] Specialty Technician

## 2022-10-22 NOTE — Unmapped (Signed)
I left a message and the clinic number for the pt to call back and reschedule with Barbette Merino

## 2022-10-28 NOTE — Unmapped (Signed)
I left a message and the clinic number for the pt to call back and reschedule with Barbette Merino

## 2022-10-30 NOTE — Unmapped (Signed)
[facility] Refill Coordination Note    Specialty Medication(s) to be Shipped:   Hematology/Oncology: Calquence    Other medication(s) to be shipped: No additional medications requested for fill at this time     Scott Roughen., DOB: [birth year]  Phone: [phone] (home)       All above HIPAA information was verified with patient.     Was a Nurse, learning disability used for this call? No    Completed refill call assessment today to schedule patient's medication shipment from the [facility] [phone]).  All relevant notes have been reviewed.     Specialty medication(s) and dose(s) confirmed: Regimen is correct and unchanged.   Changes to medications: Scott Fox reports no changes at this time.  Changes to insurance: No  New side effects reported not previously addressed with a pharmacist or physician: None reported  Questions for the pharmacist: No    Confirmed patient received a Conservation officer, historic buildings and a Surveyor, mining with first shipment. The patient will receive a drug information handout for each medication shipped and additional FDA Medication Guides as required.       DISEASE/MEDICATION-SPECIFIC INFORMATION        N/A    SPECIALTY MEDICATION ADHERENCE     Medication Adherence    Patient reported X missed doses in the last month: 0  Specialty Medication: Calquence 100 mg  Patient is on additional specialty medications: No  Informant: patient              Were doses missed due to medication being on hold? No    Calquence 100 mg: 4 days of medicine on hand       REFERRAL TO PHARMACIST     Referral to the pharmacist: Not needed      [facility]     Shipping address confirmed in Epic.       Delivery Scheduled: Yes, Expected medication delivery date: 11/03/22.     Medication will be delivered via Next Day Courier to the prescription address in Epic WAM.    Scott Fox   [facility] Specialty Technician

## 2022-11-02 MED FILL — CALQUENCE (ACALABRUTINIB MALEATE) 100 MG TABLET: ORAL | 30 days supply | Qty: 60 | Fill #2

## 2022-11-03 NOTE — Unmapped (Signed)
I left a message and the clinic number for the pt to call back and reschedule with Barbette Merino

## 2022-11-24 NOTE — Unmapped (Signed)
Called pt and left voicemail message to return my call to schedule appt w/ Dr Barbette Merino per 8/13 pt's request.

## 2022-11-25 NOTE — Unmapped (Signed)
Scott Fox. has been contacted in regards to their refill of Calquence. At this time, they have declined refill due to patient having 30 doses remaining. Refill assessment call date has been updated per the patient's request.

## 2022-12-16 NOTE — Unmapped (Signed)
[facility] Refill Coordination Note    Specialty Medication(s) to be Shipped:   Hematology/Oncology: Calquence    Other medication(s) to be shipped: No additional medications requested for fill at this time     Scott Fox., DOB: [birth year]  Phone: [phone] (home)       All above HIPAA information was verified with patient.     Was a Nurse, learning disability used for this call? No    Completed refill call assessment today to schedule patient's medication shipment from the [facility] [phone]).  All relevant notes have been reviewed.     Specialty medication(s) and dose(s) confirmed: Regimen is correct and unchanged.   Changes to medications: Kain reports no changes at this time.  Changes to insurance: No  New side effects reported not previously addressed with a pharmacist or physician: None reported  Questions for the pharmacist: No    Confirmed patient received a Conservation officer, historic buildings and a Surveyor, mining with first shipment. The patient will receive a drug information handout for each medication shipped and additional FDA Medication Guides as required.       DISEASE/MEDICATION-SPECIFIC INFORMATION        N/A    SPECIALTY MEDICATION ADHERENCE     Medication Adherence    Patient reported X missed doses in the last month: 0  Specialty Medication: CALQUENCE 100 mg tablet (acalabrutinib)  Patient is on additional specialty medications: No              Were doses missed due to medication being on hold? No    Calquence 100 mg: 2 days of medicine on hand       REFERRAL TO PHARMACIST     Referral to the pharmacist: Not needed      [facility]     Shipping address confirmed in Epic.       Delivery Scheduled: Yes, Expected medication delivery date: 12/17/22.     Medication will be delivered via Same Day Courier to the prescription address in Epic WAM.    Scott Fox [facility] Specialty Technician

## 2022-12-17 ENCOUNTER — Ambulatory Visit: Admit: 2022-12-17 | Discharge: 2022-12-17 | Payer: PRIVATE HEALTH INSURANCE

## 2022-12-17 DIAGNOSIS — C9110 Chronic lymphocytic leukemia of B-cell type not having achieved remission: Principal | ICD-10-CM

## 2022-12-17 LAB — CBC W/ AUTO DIFF
BASOPHILS ABSOLUTE COUNT: 0 10*9/L (ref 0.0–0.1)
BASOPHILS RELATIVE PERCENT: 0.1 %
EOSINOPHILS ABSOLUTE COUNT: 0.2 10*9/L (ref 0.0–0.5)
EOSINOPHILS RELATIVE PERCENT: 0.3 %
HEMATOCRIT: 43.9 % (ref 39.0–48.0)
HEMOGLOBIN: 13.9 g/dL (ref 12.9–16.5)
LYMPHOCYTES ABSOLUTE COUNT: 52.5 10*9/L — ABNORMAL HIGH (ref 1.1–3.6)
LYMPHOCYTES RELATIVE PERCENT: 94 %
MEAN CORPUSCULAR HEMOGLOBIN CONC: 31.6 g/dL — ABNORMAL LOW (ref 32.0–36.0)
MEAN CORPUSCULAR HEMOGLOBIN: 28.2 pg (ref 25.9–32.4)
MEAN CORPUSCULAR VOLUME: 89.3 fL (ref 77.6–95.7)
MEAN PLATELET VOLUME: 8 fL (ref 6.8–10.7)
MONOCYTES ABSOLUTE COUNT: 1.1 10*9/L — ABNORMAL HIGH (ref 0.3–0.8)
MONOCYTES RELATIVE PERCENT: 1.9 %
NEUTROPHILS ABSOLUTE COUNT: 2.1 10*9/L (ref 1.8–7.8)
NEUTROPHILS RELATIVE PERCENT: 3.7 %
NUCLEATED RED BLOOD CELLS: 0 /100{WBCs} (ref <=4)
PLATELET COUNT: 78 10*9/L — ABNORMAL LOW (ref 150–450)
RED BLOOD CELL COUNT: 4.91 10*12/L (ref 4.26–5.60)
RED CELL DISTRIBUTION WIDTH: 16.8 % — ABNORMAL HIGH (ref 12.2–15.2)
WBC ADJUSTED: 55.8 10*9/L (ref 3.6–11.2)

## 2022-12-17 LAB — COMPREHENSIVE METABOLIC PANEL
ALBUMIN: 4.1 g/dL (ref 3.4–5.0)
ALKALINE PHOSPHATASE: 72 U/L (ref 46–116)
ALT (SGPT): <7 U/L — ABNORMAL LOW (ref 10–49)
ANION GAP: 3 mmol/L — ABNORMAL LOW (ref 5–14)
AST (SGOT): 13 U/L (ref <=34)
BILIRUBIN TOTAL: 1.1 mg/dL (ref 0.3–1.2)
BLOOD UREA NITROGEN: 17 mg/dL (ref 9–23)
BUN / CREAT RATIO: 11
CALCIUM: 9.7 mg/dL (ref 8.7–10.4)
CHLORIDE: 109 mmol/L — ABNORMAL HIGH (ref 98–107)
CO2: 30.8 mmol/L (ref 20.0–31.0)
CREATININE: 1.52 mg/dL — ABNORMAL HIGH
EGFR CKD-EPI (2021) MALE: 51 mL/min/{1.73_m2} — ABNORMAL LOW (ref >=60)
GLUCOSE RANDOM: 181 mg/dL — ABNORMAL HIGH (ref 70–179)
POTASSIUM: 4.7 mmol/L (ref 3.4–4.8)
PROTEIN TOTAL: 6.7 g/dL (ref 5.7–8.2)
SODIUM: 143 mmol/L (ref 135–145)

## 2022-12-17 LAB — SLIDE REVIEW

## 2022-12-17 LAB — POTASSIUM, WHOLE BLOOD: POTASSIUM WHOLE BLOOD: 4.3 mmol/L (ref 3.4–4.6)

## 2022-12-17 MED FILL — CALQUENCE (ACALABRUTINIB MALEATE) 100 MG TABLET: ORAL | 30 days supply | Qty: 60 | Fill #3

## 2022-12-17 NOTE — Unmapped (Signed)
Patient Instructions:     You are having a good response to Acalabrutinib (Calquence).     I recommend you get a flu shot as they become available.    Let's plan to check back in around 4 months from now.    Medical Team    Physician: Georgiann Cocker, MD; Synetta Fail, RN, MSN, AGPCNP-C  Pharmacists: Manfred Arch, PharmD, BCOP, CPP; Ronnald Collum, PharmD, BCOP  Nurse Navigator: Joaquin Bend, MSN, RN, [facility]    Regarding labs and other test results, please know that due to federal laws, all test results are now released and available for review on MyChart immediately upon becoming available.  This means that unlike before, you will now receive results before we can review them and attach comments and explanations. We will still attach those comments or call you as soon as we have all your results (for our standard blood tests, that usually takes 48-72 hours after they're drawn).  For some patients, seeing test results without our comments can cause anxiety about those results and even misunderstanding if a patient interprets them incorrectly.  We regret any such anxiety you may experience if you choose to review labs before we have commented. Please note, however, that to prevent anxiety, we recommend that you consider waiting to review your results until you receive an email that says we have attached our comments.  That makes sure that you receive our interpretation with your labs, which can help to avoid misunderstood results and perhaps undue anxiety.  Either way, please know we monitor your test results closely.    Labs:   Appointment on 12/17/2022   Component Date Value Ref Range Status    Sodium 12/17/2022 143  135 - 145 mmol/L Final    Potassium 12/17/2022 4.7  3.4 - 4.8 mmol/L Final    Chloride 12/17/2022 109 (H)  98 - 107 mmol/L Final    CO2 12/17/2022 30.8  20.0 - 31.0 mmol/L Final    Anion Gap 12/17/2022 3 (L)  5 - 14 mmol/L Final    BUN 12/17/2022 17  9 - 23 mg/dL Final    Creatinine 16/01/9603 1.52 (H)  0.73 - 1.18 mg/dL Final    BUN/Creatinine Ratio 12/17/2022 11   Final    eGFR CKD-EPI (2021) Male 12/17/2022 51 (L)  >=60 mL/min/1.53m2 Final    eGFR calculated with CKD-EPI 2021 equation in accordance with [employer] and [employer] of Nephrology Task Force recommendations.    Glucose 12/17/2022 181 (H)  70 - 179 mg/dL Final    Calcium 54/12/8117 9.7  8.7 - 10.4 mg/dL Final    Albumin 14/78/2956 4.1  3.4 - 5.0 g/dL Final    Total Protein 12/17/2022 6.7  5.7 - 8.2 g/dL Final    Total Bilirubin 12/17/2022 1.1  0.3 - 1.2 mg/dL Final    AST 21/30/8657 13  <=34 U/L Final    ALT 12/17/2022 <7 (L)  10 - 49 U/L Final    Alkaline Phosphatase 12/17/2022 72  46 - 116 U/L Final    Potassium, Bld 12/17/2022 4.3  3.4 - 4.6 mmol/L Final    WBC 12/17/2022 55.8 (HH)  3.6 - 11.2 10*9/L Final    RBC 12/17/2022 4.91  4.26 - 5.60 10*12/L Final    HGB 12/17/2022 13.9  12.9 - 16.5 g/dL Final    HCT 84/69/6295 43.9  39.0 - 48.0 % Final    MCV 12/17/2022 89.3  77.6 - 95.7 fL Final    [facility] 12/17/2022  28.2  25.9 - 32.4 pg Final    MCHC 12/17/2022 31.6 (L)  32.0 - 36.0 g/dL Final    RDW 98/02/9146 16.8 (H)  12.2 - 15.2 % Final    MPV 12/17/2022 8.0  6.8 - 10.7 fL Final    Platelet 12/17/2022 78 (L)  150 - 450 10*9/L Final    nRBC 12/17/2022 0  <=4 /100 WBCs Final    Neutrophils % 12/17/2022 3.7  % Final    Lymphocytes % 12/17/2022 94.0  % Final    Monocytes % 12/17/2022 1.9  % Final    Eosinophils % 12/17/2022 0.3  % Final    Basophils % 12/17/2022 0.1  % Final    Absolute Neutrophils 12/17/2022 2.1  1.8 - 7.8 10*9/L Final    Absolute Lymphocytes 12/17/2022 52.5 (H)  1.1 - 3.6 10*9/L Final    Absolute Monocytes 12/17/2022 1.1 (H)  0.3 - 0.8 10*9/L Final    Absolute Eosinophils 12/17/2022 0.2  0.0 - 0.5 10*9/L Final    Absolute Basophils 12/17/2022 0.0  0.0 - 0.1 10*9/L Final       CONTACT INFORMATION     PHONE:     From Monday through Friday, 8am - 5pm, for all questions including appointments and clinical issues please call [phone] or toll-free [phone].  Please ask for nurse triage.     On Nights, Weekends and Holidays, for emergencies call [phone].    Please fax Insurance, Disability, or FMLA paperwork to [phone]. Please allow 1 week for completion of paperwork.      Leanora Ivanoff (messages you want to send through the electronic health record):     1) Send messages to Dr. Barbette Merino or Synetta Fail as needed. Messages are reviewed by our nursing staff and handled appropriately.     2) *do not use this system to send urgent messages* (anything needing a response in less than 48 hours).  If you have an urgent need, please call the number above.     Questions About Your Visit?     If you have any questions about any information included in this visit summary, please contact your provider by sending a secure message using My [place] Chart or by phone at the number listed above. Bring this form with you to your next scheduled appointment as a reminder to discuss with your provider. Use this form to make notes about any medications, including over-the-counter medications, you have stopped or started taking, or any questions you have about any test or procedure results.    Medication Refills: please do not send urgent refill requests through MyChart

## 2022-12-17 NOTE — Unmapped (Addendum)
[facility]    Patient Name: Scott Fox.  Patient Age: 67 y.o.  Encounter Date: 12/17/2022    Primary Care Provider:  Dione Housekeeper, MD    Referring Physician:  Grant Ruts*      REASON FOR VISIT     67 y.o. male who is seen in follow up for CLL.    ASSESSMENT     Diagnosis: CLL  Risk stratification:  - IGHV: mutated [06/07/18]  - Normal cytogenetics: negative FISH panel [06/07/18], normal karyotype [08/19/18]  - TP53: unmutated [08/19/18]  - Stage: Rai stage IV: lymphocytosis + thrombocytopenia (<100)    Presentation: Leukocytosis noted in 2004  Treatment history:   0) 2004-Present: Observation    Current treatment: First-line therapy - Acalabrutinib [begun 07/2022]:  - Acalabrutinib 100 mg IBID  Toxicities: No toxicities or complications.  Therapy interruptions: None  Response:  Baseline: WBC 120s-150s; hemoglobin 11s; platlets 70-80; marked cervical LAD [07/2022]  Best response: PR - WBC down to 50s, hemoglobin normalized, stable thrombocytopenia, LAD [12/17/22]      Other Problems:  Recurrent VTEs  CKD  Chronic diarrhea  Recurrent VTE  Hyperkalemia, mild, favoring pseudohyperkalemia in setting of hyperleukocytosis    CLL:  Thrombocytopenia:  Anemia, improved:  Hemoptysis, scant:  Herpes labialis:  Scott Fox is a 67 y.o. man with a long history of CLL initially diagnosed ~ 2004, on observation since that time but lost to follow up in the [facility] system in 2020.  He re-established with a new PCP in 04/2021, at which time he was noted to have hyperleukocytosis and modest thrombocytopenia.     Previously seen for acute visit for marked facial / neck swelling. 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 He has a large nodal conglomerate under his jaw.  This may be partly a manifestation of local inflammation / infection; however, given he has other indication for treatment (thrombocytopenia, splenomegaly), we started CLL therapy.    Currently has achieved a PR with resolution of LAD.    Continue Acalabrutinib.    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 Recommended CT urogram and urology evaluation. CT urogram reassuring. He has not yet seen urology.     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  He has been maintained on Coumadin followed by Rivaroxaban, though he is not currently taking this. This history may complicate treatment with a BTK inhibitor for CLL, particularly given prior bleeding complications (hematuria, hemoptysis). We have previously referred him to our benign hematology colleagues, though he did not keep this appointment.     Chronic diarrhea:   Patient reports a multiyear history of loose stools or diarrhea following oral intake.  He states this symptom has not changed recently, though he request a GI referral for further evaluation.  I think this is reasonable we will place that referral.    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 While enhanced screening is not recommended, standard screening guidelines should be closely followed.  Annual dermatology evaluation, due to increased risk of non-melanomatous skin cancers.   If needed, blood products for transfusion should be irradiated.  Risk of Hypogammaglobulinemia: No history of recurrent infections.  Vaccination  Avoid all live vaccines.  Recommend annual influenza vaccine.  Recommend pneumococcal vaccine series.  Recommend Zoster vaccine recombinant, adjuvanted (Shingrix).  Recommend updated updated COVID vaccine.  Antimicrobial prophylaxis:   Antimicrobials: None  GCSF: No  Access / Line: Peripheral  Interim bloodwork: none      PLAN     - Continue Acalabrutinib.   - Vaccination as above.    RTC in 4 months.    Laurian Brim, MD  Division of Hematology        INTERVAL HISTORY:     Last seen 08/06/22.    In the interval, cervical LAD has resolved.  He reports health-wise, I feel great.    He denies new constitutional symptoms such as anorexia, weight loss, fatigue, night sweats or unexplained fevers.  Furthermore, he denies symptoms of marrow failure: unexplained bleeding or bruising, recurrent or unexplained intercurrent infections, dyspnea on exertion, lightheadedness, palpitations or chest pain.  There have been no new or unexplained pains or self-identified masses, swelling or enlarged lymph nodes.    HEMATOLOGICAL / ONCOLOGICAL HISTORY:     Hematology/Oncology History    No history exists.       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    MEDICATIONS:     Outpatient Encounter Medications as of 12/17/2022   Medication Sig Dispense Refill    acalabrutinib (CALQUENCE) 100 mg tablet Take 1 tablet (100 mg total) by mouth two (2) times a day. Swallow whole with water. Do not to chew, crush, dissolve, or cut tablets. 60 tablet 11    glipiZIDE (GLUCOTROL XL) 10 MG 24 hr tablet Take 1 tablet (10 mg total) by mouth.      allopurinol (ZYLOPRIM) 300 MG tablet Take 1 tablet (300 mg total) by mouth daily. (Patient not taking: Reported on 12/17/2022) 30 tablet 11    atorvastatin (LIPITOR) 80 MG tablet Take 1 tablet (80 mg total) by mouth daily. (Patient not taking: Reported on 12/17/2022)      valACYclovir (VALTREX) 500 MG tablet Take 1 tablet (500 mg total) by mouth daily. (Patient not taking: Reported on 12/17/2022) 30 tablet 3    XARELTO 20 mg tablet  (Patient not taking: Reported on 12/17/2022)       No facility-administered encounter medications on file as of 12/17/2022.       SOCIAL HISTORY:      Social History     Socioeconomic History    Marital status: Married   Tobacco Use    Smoking status: Never    Smokeless tobacco: Never   Vaping Use    Vaping status: Never Used   Substance and Sexual Activity    Alcohol use: Not Currently     Alcohol/week: 26.0 standard drinks of alcohol     Types: 14 Cans of beer, 12 Shots of liquor per week     Comment: weekend consumption only     Drug use: Never     Social Determinants of Health     Financial Resource Strain: Low Risk  (09/26/2019)    Overall Financial  Resource Strain (CARDIA)     Difficulty of Paying Living Expenses: Not hard at all   Food Insecurity: No Food Insecurity (09/26/2019)    Hunger Vital Sign     Worried About Running Out of Food in the Last Year: Never true     Ran Out of Food in the Last Year: Never true   Transportation Needs: No Transportation Needs (09/26/2019)    PRAPARE - Therapist, art (Medical): No     Lack of Transportation (Non-Medical): No    Received from [facility], [facility]    Social Network        FAMILY HISTORY:     No blood disorders, sudden cardiac death or unexplained cardiac disease    Brother with colon cancer.    REVIEW OF SYSTEMS:   ROS completed and negative except as noted in the interval history.         VITAL SIGNS:     BSA: 2.41 meters squared  BP 116/64  - Pulse 61  - Temp 36.4 ??C (97.5 ??F) (Temporal)  - Resp 16  - Wt (!) 111.3 kg (245 lb 6.4 oz)  - SpO2 100%  - BMI 31.51 kg/m??     PHYSICAL EXAM:     ECOG Performance Status: 0    GENERAL:  The patient appears well and in no obvious distress.  EYES: EOMI, no scleral icterus.   RK:YHCWCBJ rate and rhythm with no murmurs, rubs or gallops.  RESP:Clear to auscultation bilaterally.  SE:GBTDVVOHYWVP, nontender with normoactive bowel sounds. No palpable splenomegaly.  MSK: No edema. No palpable abnormalities.  LYMPH: No palpable cervical, supraclavicular, axillary, or inguinal lymphadenopathy.  DERM: lip/facial lesion as below.  NEURO: A&Ox4  PSYCH: Normal affect    LABS:     WBC   Date Value Ref Range Status   12/17/2022 55.8 (HH) 3.6 - 11.2 10*9/L Final     Absolute Neutrophils   Date Value Ref Range Status   12/17/2022 2.1 1.8 - 7.8 10*9/L Final     Absolute Lymphocytes   Date Value Ref Range Status   12/17/2022 52.5 (H) 1.1 - 3.6 10*9/L Final     HGB   Date Value Ref Range Status   12/17/2022 13.9 12.9 - 16.5 g/dL Final     HCT   Date Value Ref Range Status   12/17/2022 43.9 39.0 - 48.0 % Final     MCV   Date Value Ref Range Status   12/17/2022 89.3 77.6 - 95.7 fL Final     Platelet   Date Value Ref Range Status   12/17/2022 78 (L) 150 - 450 10*9/L Final     Sodium   Date Value Ref Range Status   12/17/2022 143 135 - 145 mmol/L Final     Potassium   Date Value Ref Range Status   12/17/2022 4.7 3.4 - 4.8 mmol/L Final     Potassium, Bld   Date Value Ref Range Status   12/17/2022 4.3 3.4 - 4.6 mmol/L Final     Chloride   Date Value Ref Range Status   12/17/2022 109 (H) 98 - 107 mmol/L Final     CO2   Date Value Ref Range Status   12/17/2022 30.8 20.0 - 31.0 mmol/L Final     BUN   Date Value Ref Range Status   12/17/2022 17 9 - 23 mg/dL Final     Creatinine   Date Value Ref Range Status  12/17/2022 1.52 (H) 0.73 - 1.18 mg/dL Final     Glucose   Date Value Ref Range Status   12/17/2022 181 (H) 70 - 179 mg/dL Final     Calcium   Date Value Ref Range Status   12/17/2022 9.7 8.7 - 10.4 mg/dL Final     Magnesium   Date Value Ref Range Status   05/07/2021 1.7 1.6 - 2.6 mg/dL Final     Albumin   Date Value Ref Range Status   12/17/2022 4.1 3.4 - 5.0 g/dL Final     Total Protein   Date Value Ref Range Status   12/17/2022 6.7 5.7 - 8.2 g/dL Final     Total Bilirubin   Date Value Ref Range Status   12/17/2022 1.1 0.3 - 1.2 mg/dL Final     AST   Date Value Ref Range Status 12/17/2022 13 <=34 U/L Final     ALT   Date Value Ref Range Status   12/17/2022 <7 (L) 10 - 49 U/L Final     Alkaline Phosphatase   Date Value Ref Range Status   12/17/2022 72 46 - 116 U/L Final     Hep B S Ab   Date Value Ref Range Status   05/28/2021 Nonreactive Nonreactive, [place] Final     Hep B Core Total Ab   Date Value Ref Range Status   05/28/2021 Reactive (A) Nonreactive Final     Hepatitis C Ab   Date Value Ref Range Status   05/28/2021 Reactive (A) Nonreactive Final

## 2022-12-17 NOTE — Unmapped (Signed)
Addended by: Laurian Brim E on: 12/17/2022 10:12 AM     Modules accepted: Orders

## 2023-01-20 NOTE — Unmapped (Signed)
The [facility] has made a third and final attempt to reach this patient to refill the following medication:Calquence.      We have left voicemails on the following phone numbers: [phone], have sent a text message to the following phone numbers: [phone], and have sent a Mychart questionnaire..    Dates contacted: 9/25,10/2,9  Last scheduled delivery: 12/17/22    The patient may be at risk of non-compliance with this medication. The patient should call the [facility] at [phone]  Option 4, then Option 1: Oncology to refill medication.    [facility] Specialty Technician

## 2023-02-12 NOTE — Unmapped (Signed)
[facility] Clinical Assessment & Refill Coordination Note    Slade Pierpoint., DOB: 1956-02-16  Phone: [phone] (home)     All above HIPAA information was verified with patient.     Was a Nurse, learning disability used for this call? No    Specialty Medication(s):   Hematology/Oncology: Calquence     Current Outpatient Medications   Medication Sig Dispense Refill    acalabrutinib (CALQUENCE) 100 mg tablet Take 1 tablet (100 mg total) by mouth two (2) times a day. Swallow whole with water. Do not to chew, crush, dissolve, or cut tablets. 60 tablet 11    glipiZIDE (GLUCOTROL XL) 10 MG 24 hr tablet Take 1 tablet (10 mg total) by mouth.      XARELTO 20 mg tablet  (Patient not taking: Reported on 12/17/2022)       No current facility-administered medications for this visit.        Changes to medications: Demarion reports no changes at this time.    No Known Allergies    Changes to allergies: No    SPECIALTY MEDICATION ADHERENCE     Calquence 100 mg: 4 days of medicine on hand     Are there any concerns with adherence?  Jaaron denies missed doses.  [facility] last dispensed 30 day supply on 12/17/22    Adherence counseling provided? Not needed    Patient-Reported Symptoms Tracker for Cancer Patients on Oral Chemotherapy     Oral chemotherapy medication name(s): Calquence  Dose and frequency: 100 mg twice daily  Oral Chemotherapy Start Date:    Baseline? No  Clinic(s) visited: Hematology    Symptom Grouping Question Patient Response   Digestion and Eating Have you felt sick to your stomach? Denies    Had diarrhea? Denies    Constipated? Denies    Not wanting to eat? Denies    Comments      Sleep and Pain Felt very tired even after you rest? Denies    Pain due to cancer medication or cancer? Denies    Comments     Other Side Effects Numbness or tingling in hands and/or feet? Denies    Felt short of breath? Denies    Mouth or throat Sores? Denies    Rash? Denies    Palmar-plantar erythrodysesthesia syndrome? Rash - acneiform?      Rash - maculo-papular?      How many days over the past month did your cancer medication or cancer keep you from your normal activities?  Write in number of days, 0-30:  0    Other side effects or things you would like to discuss?      Comments?     Adherence  In the last 30 days, on how many days did you miss at least one dose of any of your [drug name]? Write in number of days, 0-30:  0    What reasons are you having trouble taking your medication [pharmacist: check all that apply]? Specify chemotherapy cycle:        No problems identified    Comments:        Comments Halbert denies missed doses.  Has 4 day supply on hand.  [facility] last dispensed 30 day supply on 12/17/22     Optional Symptom Tracking Comments:      CLINICAL MANAGEMENT AND INTERVENTION      Clinical Benefit Assessment:    Do you feel the medicine is effective or helping your condition? Yes  Clinical Benefit counseling provided? Not needed    Acute Infection Status:    Acute infections noted within Epic:  No active infections    Patient reported infection: None    Therapy Appropriateness:    Is therapy appropriate based on current medication list, adverse reactions, adherence, clinical benefit and progress toward achieving therapeutic goals?  Yes, therapy is appropriate and should be continued    DISEASE/MEDICATION-SPECIFIC INFORMATION      N/A    Is the patient receiving adequate infection prevention treatment? Not applicable    Does the patient have adequate nutritional support? Not applicable    PATIENT SPECIFIC NEEDS     Does the patient have any physical, cognitive, or cultural barriers? No    Is the patient high risk? No    Did the patient require a clinical intervention? No    Does the patient require physician intervention or other additional services (i.e., nutrition, smoking cessation, social work)? No    SOCIAL DETERMINANTS OF HEALTH     At the [facility], we have learned that life circumstances - like trouble affording food, housing, utilities, or transportation can affect the health of many of our patients.   That is why we wanted to ask: are you currently experiencing any life circumstances that are negatively impacting your health and/or quality of life? Patient declined to answer    Social Determinants of Health     Food Insecurity: No Food Insecurity (09/26/2019)    Hunger Vital Sign     Worried About Running Out of Food in the Last Year: Never true     Ran Out of Food in the Last Year: Never true   Internet Connectivity: Not on file   Housing/Utilities: Low Risk  (09/26/2019)    Housing/Utilities     Within the past 12 months, have you ever stayed: outside, in a car, in a tent, in an overnight shelter, or temporarily in someone else's home (i.e. couch-surfing)?: No     Are you worried about losing your housing?: No     Within the past 12 months, have you been unable to get utilities (heat, electricity) when it was really needed?: No   Tobacco Use: Low Risk  (12/17/2022)    Patient History     Smoking Tobacco Use: Never     Smokeless Tobacco Use: Never     Passive Exposure: Not on file   Transportation Needs: No Transportation Needs (09/26/2019)    PRAPARE - Transportation     Lack of Transportation (Medical): No     Lack of Transportation (Non-Medical): No   Alcohol Use: Not on file   Interpersonal Safety: Unknown (02/12/2023)    Interpersonal Safety     Unsafe Where You Currently Live: Not on file     Physically Hurt by Anyone: Not on file     Abused by Anyone: Not on file   Physical Activity: Not on file   Intimate Partner Violence: Unknown (07/16/2021)    Received from [facility], [facility]    HITS     Physically Hurt: Not on file     Insult or Talk Down To: Not on file     Threaten Physical Harm: Not on file     Scream or Curse: Not on file   Stress: Not on file   Substance Use: Not on file   Social Connections: Unknown (08/24/2021)    Received from [facility], [facility]    Social Network Social Network: Not on  file   Financial Resource Strain: Low Risk  (09/26/2019)    Overall Financial Resource Strain (CARDIA)     Difficulty of Paying Living Expenses: Not hard at all   Depression: Not at risk (07/16/2022)    PHQ-2     PHQ-2 Score: 0   Health Literacy: Not on file       Would you be willing to receive help with any of the needs that you have identified today? Not applicable       SHIPPING     Specialty Medication(s) to be Shipped:   Hematology/Oncology: Calquence    Other medication(s) to be shipped: No additional medications requested for fill at this time     Changes to insurance: No    Delivery Scheduled: Yes, Expected medication delivery date: 02/16/23.     Medication will be delivered via Next Day Courier to the confirmed prescription address in [facility].    The patient will receive a drug information handout for each medication shipped and additional FDA Medication Guides as required.  Verified that patient has previously received a Conservation officer, historic buildings and a Surveyor, mining.    The patient or caregiver noted above participated in the development of this care plan and knows that they can request review of or adjustments to the care plan at any time.      All of the patient's questions and concerns have been addressed.    Kermit Balo, [facility]   [facility]  Pharmacist

## 2023-02-15 MED FILL — CALQUENCE (ACALABRUTINIB MALEATE) 100 MG TABLET: ORAL | 30 days supply | Qty: 60 | Fill #4

## 2023-03-22 NOTE — Unmapped (Signed)
The [facility] has made a third and final attempt to reach this patient to refill the following medication:Calquence.      We have left voicemails on the following phone numbers: [phone], have sent a text message to the following phone numbers: [phone], and have sent a Mychart questionnaire..    Dates contacted: 11/27,12/3,9  Last scheduled delivery: 02/15/23    The patient may be at risk of non-compliance with this medication. The patient should call the [facility] at [phone]  Option 4, then Option 1: Oncology to refill medication.    [facility] Kinder [facility] Specialty Technician

## 2023-04-19 ENCOUNTER — Emergency Department: Payer: Managed Care, Other (non HMO)

## 2023-04-19 ENCOUNTER — Other Ambulatory Visit: Payer: Self-pay

## 2023-04-19 ENCOUNTER — Inpatient Hospital Stay: Payer: Managed Care, Other (non HMO)

## 2023-04-19 ENCOUNTER — Observation Stay
Admission: EM | Admit: 2023-04-19 | Discharge: 2023-04-20 | Disposition: A | Discharge status: Home / Self Care | Payer: Managed Care, Other (non HMO) | Attending: Family Medicine | Admitting: Family Medicine

## 2023-04-19 DIAGNOSIS — I82409 Acute embolism and thrombosis of unspecified deep veins of unspecified lower extremity: Secondary | ICD-10-CM | POA: Diagnosis present

## 2023-04-19 DIAGNOSIS — I82403 Acute embolism and thrombosis of unspecified deep veins of lower extremity, bilateral: Secondary | ICD-10-CM | POA: Diagnosis not present

## 2023-04-19 DIAGNOSIS — T45516A Underdosing of anticoagulants, initial encounter: Secondary | ICD-10-CM | POA: Diagnosis present

## 2023-04-19 DIAGNOSIS — Z833 Family history of diabetes mellitus: Secondary | ICD-10-CM | POA: Diagnosis not present

## 2023-04-19 DIAGNOSIS — Z86718 Personal history of other venous thrombosis and embolism: Secondary | ICD-10-CM

## 2023-04-19 DIAGNOSIS — C9110 Chronic lymphocytic leukemia of B-cell type not having achieved remission: Secondary | ICD-10-CM | POA: Diagnosis present

## 2023-04-19 DIAGNOSIS — Z794 Long term (current) use of insulin: Secondary | ICD-10-CM | POA: Insufficient documentation

## 2023-04-19 DIAGNOSIS — I82401 Acute embolism and thrombosis of unspecified deep veins of right lower extremity: Secondary | ICD-10-CM | POA: Insufficient documentation

## 2023-04-19 DIAGNOSIS — T383X6A Underdosing of insulin and oral hypoglycemic [antidiabetic] drugs, initial encounter: Secondary | ICD-10-CM | POA: Diagnosis present

## 2023-04-19 DIAGNOSIS — I10 Essential (primary) hypertension: Secondary | ICD-10-CM | POA: Diagnosis present

## 2023-04-19 DIAGNOSIS — Z823 Family history of stroke: Secondary | ICD-10-CM | POA: Diagnosis not present

## 2023-04-19 DIAGNOSIS — Z91138 Patient's unintentional underdosing of medication regimen for other reason: Secondary | ICD-10-CM

## 2023-04-19 DIAGNOSIS — J121 Respiratory syncytial virus pneumonia: Secondary | ICD-10-CM | POA: Insufficient documentation

## 2023-04-19 DIAGNOSIS — R739 Hyperglycemia, unspecified: Principal | ICD-10-CM | POA: Diagnosis present

## 2023-04-19 DIAGNOSIS — N179 Acute kidney failure, unspecified: Secondary | ICD-10-CM

## 2023-04-19 DIAGNOSIS — Z7901 Long term (current) use of anticoagulants: Secondary | ICD-10-CM | POA: Diagnosis not present

## 2023-04-19 DIAGNOSIS — R531 Weakness: Secondary | ICD-10-CM | POA: Diagnosis present

## 2023-04-19 DIAGNOSIS — E1165 Type 2 diabetes mellitus with hyperglycemia: Principal | ICD-10-CM | POA: Diagnosis present

## 2023-04-19 DIAGNOSIS — Z20822 Contact with and (suspected) exposure to covid-19: Secondary | ICD-10-CM | POA: Insufficient documentation

## 2023-04-19 DIAGNOSIS — D696 Thrombocytopenia, unspecified: Secondary | ICD-10-CM | POA: Insufficient documentation

## 2023-04-19 DIAGNOSIS — Z1152 Encounter for screening for COVID-19: Secondary | ICD-10-CM | POA: Diagnosis not present

## 2023-04-19 DIAGNOSIS — I82431 Acute embolism and thrombosis of right popliteal vein: Secondary | ICD-10-CM | POA: Diagnosis present

## 2023-04-19 DIAGNOSIS — I82411 Acute embolism and thrombosis of right femoral vein: Secondary | ICD-10-CM | POA: Diagnosis present

## 2023-04-19 DIAGNOSIS — E119 Type 2 diabetes mellitus without complications: Secondary | ICD-10-CM

## 2023-04-19 LAB — COMPREHENSIVE METABOLIC PANEL
ALT: 18 U/L (ref 0–44)
AST: 15 U/L (ref 15–41)
Albumin: 4.4 g/dL (ref 3.5–5.0)
Alkaline Phosphatase: 98 U/L (ref 38–126)
Anion gap: 13 (ref 5–15)
BUN: 24 mg/dL — ABNORMAL HIGH (ref 8–23)
CO2: 23 mmol/L (ref 22–32)
Calcium: 9.7 mg/dL (ref 8.9–10.3)
Chloride: 97 mmol/L — ABNORMAL LOW (ref 98–111)
Creatinine, Ser: 2.11 mg/dL — ABNORMAL HIGH (ref 0.61–1.24)
GFR, Estimated: 34 mL/min — ABNORMAL LOW (ref >60)
Glucose, Bld: 693 mg/dL (ref 70–99)
Potassium: 4.8 mmol/L (ref 3.5–5.1)
Sodium: 133 mmol/L — ABNORMAL LOW (ref 135–145)
Total Bilirubin: 2.6 mg/dL — ABNORMAL HIGH (ref 0.0–1.2)
Total Protein: 7.3 g/dL (ref 6.5–8.1)

## 2023-04-19 LAB — CBC WITH DIFFERENTIAL/PLATELET
Abs Immature Granulocytes: 0.15 10*3/uL — ABNORMAL HIGH (ref 0.00–0.07)
Basophils Absolute: 0 10*3/uL (ref 0.0–0.1)
Basophils Relative: 0 %
Eosinophils Absolute: 0.1 10*3/uL (ref 0.0–0.5)
Eosinophils Relative: 0 %
HCT: 49.5 % (ref 39.0–52.0)
Hemoglobin: 15.6 g/dL (ref 13.0–17.0)
Immature Granulocytes: 0 %
Lymphocytes Relative: 90 %
Lymphs Abs: 44.3 10*3/uL — ABNORMAL HIGH (ref 0.7–4.0)
MCH: 29.3 pg (ref 26.0–34.0)
MCHC: 31.5 g/dL (ref 30.0–36.0)
MCV: 93 fL (ref 80.0–100.0)
Monocytes Absolute: 0.5 10*3/uL (ref 0.1–1.0)
Monocytes Relative: 1 %
Neutro Abs: 4.6 10*3/uL (ref 1.7–7.7)
Neutrophils Relative %: 9 %
Platelets: 82 10*3/uL — ABNORMAL LOW (ref 150–400)
RBC: 5.32 MIL/uL (ref 4.22–5.81)
RDW: 15.6 % — ABNORMAL HIGH (ref 11.5–15.5)
Smear Review: NORMAL
WBC: 49.6 10*3/uL — ABNORMAL HIGH (ref 4.0–10.5)
nRBC: 0 % (ref 0.0–0.2)

## 2023-04-19 LAB — RESP PANEL BY RT-PCR (RSV, FLU A&B, COVID)  RVPGX2
Influenza A by PCR: NEGATIVE
Influenza B by PCR: NEGATIVE
Resp Syncytial Virus by PCR: POSITIVE — AB
SARS Coronavirus 2 by RT PCR: NEGATIVE

## 2023-04-19 LAB — BASIC METABOLIC PANEL
Anion gap: 13 (ref 5–15)
Anion gap: 13 (ref 5–15)
BUN: 27 mg/dL — ABNORMAL HIGH (ref 8–23)
BUN: 25 mg/dL — ABNORMAL HIGH (ref 8–23)
CO2: 21 mmol/L — ABNORMAL LOW (ref 22–32)
CO2: 24 mmol/L (ref 22–32)
Calcium: 8.8 mg/dL — ABNORMAL LOW (ref 8.9–10.3)
Calcium: 9.4 mg/dL (ref 8.9–10.3)
Chloride: 96 mmol/L — ABNORMAL LOW (ref 98–111)
Chloride: 100 mmol/L (ref 98–111)
Creatinine, Ser: 1.87 mg/dL — ABNORMAL HIGH (ref 0.61–1.24)
Creatinine, Ser: 1.86 mg/dL — ABNORMAL HIGH (ref 0.61–1.24)
GFR, Estimated: 39 mL/min — ABNORMAL LOW (ref >60)
GFR, Estimated: 40 mL/min — ABNORMAL LOW (ref >60)
Glucose, Bld: 684 mg/dL (ref 70–99)
Glucose, Bld: 343 mg/dL — ABNORMAL HIGH (ref 70–99)
Potassium: 4.3 mmol/L (ref 3.5–5.1)
Potassium: 3.8 mmol/L (ref 3.5–5.1)
Sodium: 130 mmol/L — ABNORMAL LOW (ref 135–145)
Sodium: 137 mmol/L (ref 135–145)

## 2023-04-19 LAB — CBG MONITORING, ED
Glucose-Capillary: 186 mg/dL — ABNORMAL HIGH (ref 70–99)
Glucose-Capillary: 187 mg/dL — ABNORMAL HIGH (ref 70–99)
Glucose-Capillary: 209 mg/dL — ABNORMAL HIGH (ref 70–99)
Glucose-Capillary: 287 mg/dL — ABNORMAL HIGH (ref 70–99)
Glucose-Capillary: 447 mg/dL — ABNORMAL HIGH (ref 70–99)
Glucose-Capillary: 518 mg/dL (ref 70–99)
Glucose-Capillary: 577 mg/dL (ref 70–99)
Glucose-Capillary: >600 mg/dL (ref 70–99)

## 2023-04-19 LAB — BETA-HYDROXYBUTYRIC ACID: Beta-Hydroxybutyric Acid: 2.87 mmol/L — ABNORMAL HIGH (ref 0.05–0.27)

## 2023-04-19 LAB — SODIUM, URINE, RANDOM: Sodium, Ur: 51 mmol/L

## 2023-04-19 LAB — PROTIME-INR
INR: 1.2 (ref 0.8–1.2)
Prothrombin Time: 15.7 s — ABNORMAL HIGH (ref 11.4–15.2)

## 2023-04-19 LAB — TROPONIN I (HIGH SENSITIVITY)
Troponin I (High Sensitivity): 5 ng/L (ref <18)
Troponin I (High Sensitivity): 8 ng/L (ref <18)

## 2023-04-19 LAB — OSMOLALITY: Osmolality: 325 mosm/kg (ref 275–295)

## 2023-04-19 LAB — CREATININE, URINE, RANDOM: Creatinine, Urine: 30 mg/dL

## 2023-04-19 LAB — APTT: aPTT: 23 s — ABNORMAL LOW (ref 24–36)

## 2023-04-19 MED ORDER — SODIUM CHLORIDE 0.9 % IV BOLUS
1000.0000 mL | Freq: Once | INTRAVENOUS | Status: AC
  Administered 2023-04-19: 1000 mL via INTRAVENOUS

## 2023-04-19 MED ORDER — INSULIN REGULAR(HUMAN) IN NACL 100-0.9 UT/100ML-% IV SOLN
INTRAVENOUS | Status: DC
  Administered 2023-04-19: 13 [IU]/h via INTRAVENOUS
  Administered 2023-04-20: 3.4 [IU]/h via INTRAVENOUS
  Filled 2023-04-19 (×2): qty 100

## 2023-04-19 MED ORDER — HEPARIN BOLUS VIA INFUSION
6000.0000 [IU] | Freq: Once | INTRAVENOUS | Status: DC
  Filled 2023-04-19: qty 6000

## 2023-04-19 MED ORDER — APIXABAN 5 MG PO TABS: 5.0000 mg | ORAL_TABLET | Freq: Two times a day (BID) | ORAL | Status: DC

## 2023-04-19 MED ORDER — INSULIN ASPART 100 UNIT/ML IJ SOLN
5.0000 [IU] | Freq: Once | INTRAMUSCULAR | Status: DC
  Administered 2023-04-19: 5 [IU] via INTRAVENOUS
  Filled 2023-04-19: qty 1

## 2023-04-19 MED ORDER — POTASSIUM CHLORIDE 10 MEQ/100ML IV SOLN
10.0000 meq | INTRAVENOUS | Status: AC
  Administered 2023-04-19 (×2): 10 meq via INTRAVENOUS
  Filled 2023-04-19: qty 100

## 2023-04-19 MED ORDER — LACTATED RINGERS IV SOLN: INTRAVENOUS | Status: DC

## 2023-04-19 MED ORDER — DEXTROSE 50 % IV SOLN
0.0000 mL | INTRAVENOUS | Status: DC | PRN
  Filled 2023-04-19: qty 50

## 2023-04-19 MED ORDER — APIXABAN 5 MG PO TABS
10.0000 mg | ORAL_TABLET | Freq: Two times a day (BID) | ORAL | Status: DC
  Administered 2023-04-19 – 2023-04-20 (×2): 10 mg via ORAL
  Filled 2023-04-19 (×2): qty 2

## 2023-04-19 MED ORDER — HEPARIN (PORCINE) 25000 UT/250ML-% IV SOLN: 1700.0000 [IU]/h | INTRAVENOUS | Status: DC

## 2023-04-19 MED ORDER — DEXTROSE IN LACTATED RINGERS 5 % IV SOLN: INTRAVENOUS | Status: DC

## 2023-04-19 NOTE — H&P (Addendum)
 History and Physical    Patient: Anthony Hart. FMW:[id number] DOB: 08-Mar-1956 DOA: 04/19/2023 DOS: the patient was seen and examined on 04/19/2023 PCP: Eliverto Bette Hover, MD  Patient coming from: Home  Chief Complaint:  Chief Complaint  Patient presents with   Weakness   HPI: Anthony Hart. is a 68 y.o. male with medical history significant of CLL, T2DM, chronic VTE presenting with hyperglycemia, recurrent DVT, medication noncompliance, thrombocytopenia, RSV.  Patient reports having not taken his medication for roughly 6 months.  Medications included Xarelto as well as diabetic regimen.  Patient has been compliant with his CLL medication Calquence.  Patient states he had issues with the pharmacy and the supply of the medication he was getting.  Has not been checking blood sugars over this timeframe.  Has had worsening polyuria, polydipsia as well as lower extremity swelling over several days.  No chest pain or shortness of breath reported.  No abdominal pain.  No focal hemiparesis or confusion.  Non-smoker.  Denies any alcohol or drug use.  No active HI/SI.  Presented to ER afebrile, HR 100s, BP stable, satting well on RA. WBC 49.6, hgb 15.6, plt 80s. RSV positive. CXR w/ trace R pleural effusion. LE u/s + for acute on chronic DVT in the right lower extremity, Mild sequelae of chronic DVT noted in the right common femoral.  Review of Systems: As mentioned in the history of present illness. All other systems reviewed and are negative. Past Medical History:  Diagnosis Date   [name]    Cancer (HCC)    CLL monitored    Clotting disorder (HCC)    Diabetes mellitus without complication (HCC)    Type II   Hypertension    Past Surgical History:  Procedure Laterality Date   APPENDECTOMY     COLONOSCOPY WITH PROPOFOL   02/04/2015   Procedure: COLONOSCOPY WITH PROPOFOL ;  Surgeon: Deward CINDERELLA Piedmont, MD;  Location: [facility];  Service: Gastroenterology;;   ESOPHAGOGASTRODUODENOSCOPY  (EGD) WITH PROPOFOL  N/A 02/04/2015   Procedure: ESOPHAGOGASTRODUODENOSCOPY (EGD) WITH PROPOFOL ;  Surgeon: Deward CINDERELLA Piedmont, MD;  Location: [facility];  Service: Gastroenterology;  Laterality: N/A;   PERIPHERAL VASCULAR THROMBECTOMY Right 07/02/2016   Procedure: Peripheral Vascular Thrombectomy and IVC filter;  Surgeon: Selinda GORMAN Gu, MD;  Location: [facility];  Service: Cardiovascular;  Laterality: Right;   PERIPHERAL VASCULAR THROMBECTOMY Bilateral 10/30/2016   Procedure: Peripheral Vascular Thrombectomy;  Surgeon: Gu Selinda GORMAN, MD;  Location: [facility];  Service: Cardiovascular;  Laterality: Bilateral;   PERIPHERAL VASCULAR THROMBECTOMY Right 05/24/2017   Procedure: PERIPHERAL VASCULAR THROMBECTOMY;  Surgeon: Gu Selinda GORMAN, MD;  Location: [facility];  Service: Cardiovascular;  Laterality: Right;   Social History:  reports that he has never smoked. He has never used smokeless tobacco. He reports current alcohol use. He reports that he does not use drugs.  No Known Allergies  Family History  Problem Relation Age of Onset   Diabetes Mother    Cancer Brother    Cancer Maternal Aunt    Stroke Maternal Uncle     Prior to Admission medications   Medication Sig Start Date End Date Taking? Authorizing Provider  albuterol  (VENTOLIN  HFA) 108 (90 Base) MCG/ACT inhaler Inhale 2 puffs into the lungs every 6 (six) hours as needed for wheezing or shortness of breath. Patient not taking: Reported on 04/19/2023 06/24/22   Lang Dover, MD  atorvastatin (LIPITOR) 80 MG tablet Take 80 mg by mouth  daily. Patient not taking: Reported on 04/19/2023 05/17/18   [provider]  azithromycin  (ZITHROMAX  Z-PAK) 250 MG tablet 1 pack Patient not taking: Reported on 04/19/2023 06/24/22   Lang Dover, MD  brompheniramine-pseudoephedrine-DM 30-2-10 MG/5ML syrup Take 5 mLs by mouth 4 (four) times daily as needed. Patient not taking: Reported on 04/19/2023 08/05/19   Claudene Tanda POUR, PA-C   ibuprofen  (ADVIL ) 600 MG tablet Take 1 tablet (600 mg total) by mouth every 8 (eight) hours as needed. Patient not taking: Reported on 04/19/2023 08/05/19   Claudene Tanda POUR, PA-C  metFORMIN  (GLUCOPHAGE -XR) 750 MG 24 hr tablet Take 750 mg by mouth daily. 03/09/16 10/04/18  [provider]  warfarin (COUMADIN ) 7.5 MG tablet Take 1 tablet (7.5 mg total) by mouth daily at 6 PM. Patient not taking: Reported on 04/19/2023 07/07/17   Jacobo Evalene PARAS, MD    Physical Exam: Vitals:   04/19/23 1417 04/19/23 1419  BP:  114/84  Pulse:  (!) 113  Resp:  19  Temp:  97.9 F (36.6 C)  TempSrc:  Oral  SpO2:  98%  Weight: 100.1 kg   Height: 6' 3 (1.905 m)    Physical Exam Constitutional:      Appearance: He is normal weight.  HENT:     Head: Normocephalic and atraumatic.     Nose: Nose normal.     Mouth/Throat:     Mouth: Mucous membranes are moist.  Eyes:     Pupils: Pupils are equal, round, and reactive to light.  Cardiovascular:     Rate and Rhythm: Normal rate and regular rhythm.  Pulmonary:     Effort: Pulmonary effort is normal.  Abdominal:     General: Bowel sounds are normal.  Musculoskeletal:        General: Normal range of motion.     Cervical back: Normal range of motion.  Skin:    General: Skin is warm.  Neurological:     General: No focal deficit present.  Psychiatric:        Mood and Affect: Mood normal.     Data Reviewed:  There are no new results to review at this time.  US  Venous Img Lower Bilateral CLINICAL DATA:  Bilateral lower extremity edema. History of known right lower extremity DVT. Patient has been noncompliant with anticoagulation.  EXAM: BILATERAL LOWER EXTREMITY VENOUS DOPPLER ULTRASOUND  TECHNIQUE: Gray-scale sonography with graded compression, as well as color Doppler and duplex ultrasound were performed to evaluate the lower extremity deep venous systems from the level of the common femoral vein and including the common femoral,  femoral, profunda femoral, popliteal and calf veins including the posterior tibial, peroneal and gastrocnemius veins when visible. The superficial great saphenous vein was also interrogated. Spectral Doppler was utilized to evaluate flow at rest and with distal augmentation maneuvers in the common femoral, femoral and popliteal veins.  COMPARISON:  None Available.  FINDINGS: RIGHT LOWER EXTREMITY  Common Femoral Vein: Incompletely compressible with echogenic cine key a in the center of the vessel. Color flow is present on color Doppler imaging. Findings are consistent with sequelae of chronic DVT.  Saphenofemoral Junction: No evidence of thrombus. Normal compressibility and flow on color Doppler imaging.  Profunda Femoral Vein: Positive color flow on color Doppler imaging. Perhaps trace chronic DVT.  Femoral Vein: Absent color flow throughout the femoral vein.  Popliteal Vein: Absent color flow in the popliteal vein.  Calf Veins: Limited evaluation.  Superficial Great Saphenous Vein: No evidence of thrombus. Normal  compressibility.  Venous Reflux:  None.  Other Findings:  None.  LEFT LOWER EXTREMITY  Common Femoral Vein: No evidence of thrombus. Normal compressibility, respiratory phasicity and response to augmentation.  Saphenofemoral Junction: No evidence of thrombus. Normal compressibility and flow on color Doppler imaging.  Profunda Femoral Vein: No evidence of thrombus. Normal compressibility and flow on color Doppler imaging.  Femoral Vein: No evidence of thrombus. Normal compressibility, respiratory phasicity and response to augmentation.  Popliteal Vein: No evidence of thrombus. Normal compressibility, respiratory phasicity and response to augmentation.  Calf Veins: No evidence of thrombus. Normal compressibility and flow on color Doppler imaging.  Superficial Great Saphenous Vein: No evidence of thrombus. Normal compressibility.  Venous Reflux:   None.  Other Findings:  None.  IMPRESSION: 1. Probable acute on chronic DVT in the right lower extremity with absent color flow in the femoral and popliteal veins suggesting acute occlusive thrombus. 2. Mild sequelae of chronic DVT noted in the right common femoral vein. 3. No evidence of acute DVT in the left lower extremity.  Electronically Signed   By: Wilkie Lent M.D.   On: 04/19/2023 17:00 DG Chest 2 View CLINICAL DATA:  Cough/cold symptoms.  EXAM: CHEST - 2 VIEW  COMPARISON:  Chest x-ray 06/24/2022  FINDINGS: There is a trace right pleural effusion. There is no focal lung infiltrate, pleural effusion or pneumothorax. The cardiomediastinal silhouette is within normal limits. No acute fractures are seen.  IMPRESSION: Trace right pleural effusion.  Electronically Signed   By: Greig Pique M.D.   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 HHS  IV hydration  Check FeNa and renal imaging  Hold nephrotoxic agents    RSV (respiratory syncytial virus pneumonia) RSV positive today No active cough, shortness of breath or hypoxia Symptomatic management for now Monitor  Thrombocytopenia (HCC) Plt count in 80s in  setting of CLL  Monitor w/ anticoagulation use    CLL (chronic lymphocytic leukemia) (HCC) Followed by [facility] hem-on[facility]  WBC in 40s  Cont calquence      Greater than 50% was spent in counseling and coordination of care with patient Critical care time: 70 minutes or more   Advance Care Planning:   Code Status: Full Code   Consults: None   Family Communication: Family at the bedside   Severity of Illness: The appropriate patient status for this patient is INPATIENT. Inpatient status is judged to be reasonable and necessary in order to provide the required intensity of service to ensure the patient's safety. The patient's presenting symptoms, physical exam findings, and initial radiographic and laboratory data in the context of their chronic comorbidities is felt to place them at high risk for further clinical deterioration. Furthermore, it  is not anticipated that the patient will be medically stable for discharge from the hospital within 2 midnights of admission.   * I certify that at the point of admission it is my clinical judgment that the patient will require inpatient hospital care spanning beyond 2 midnights from the point of admission due to high intensity of service, high risk for further deterioration and high frequency of surveillance required.*  Author: Elspeth JINNY Masters, MD 04/19/2023 5:48 PM  For on call review [email or web address].

## 2023-04-19 NOTE — Assessment & Plan Note (Signed)
 Blood sugar in upper 600s on presentation without overt DKA BHB 2.9 Osmolality 325  Will start on hyperglycemia protocol w/ insulin  gtt  Discussed importance of medication adherence Transition to long acting insulin  and SSI once blood sugar, BHB normalize  Follow

## 2023-04-19 NOTE — Assessment & Plan Note (Signed)
 Cr 2.1 w/ GFR in 40s  Clinically dry in setting of hyperglycemia vs. HHS  IV hydration  Check FeNa and renal imaging  Hold nephrotoxic agents

## 2023-04-19 NOTE — ED Provider Triage Note (Signed)
 Emergency Medicine Provider Triage Evaluation Note  Anthony DELENA Irven Mickey. , a 68 y.o. male  was evaluated in triage.  Pt complains of dry mouth, thirsty, urinating often. Has been out of diabetes medications for the past 6 months. He also has swelling and throbbing to right lower extremity. Not currently taking his blood thinners. He is only doing leukemia treatments.  Physical Exam  There were no vitals taken for this visit. Gen:   Awake, no distress   Resp:  Normal effort  MSK:   Moves extremities without difficulty  Other:    Medical Decision Making  Medically screening exam initiated at 2:16 PM.  Appropriate orders placed.  Anthony A Kolten Ryback. was informed that the remainder of the evaluation will be completed by another provider, this initial triage assessment does not replace that evaluation, and the importance of remaining in the ED until their evaluation is complete.     Anthony Kirk NOVAK, FNP 04/20/23 [phone]

## 2023-04-19 NOTE — ED Provider Notes (Signed)
 [facility] Provider Note   Event Date/Time   First MD Initiated Contact with Patient 04/19/23 1502     (approximate)  History   Weakness  HPI  Anthony Hart. is a 68 y.o. male history of DVT leukemia diabetes   Patient has started feeling very thirsty all bit lightheaded with standing, and fatigue for a week.  He and his wife relate that has been ongoing for several days time, and he seems to feel like he needs to drink more more water  No fevers or chills.  He said a very slight cough a little bit of a runny nose for several days time maybe a week or more.  No nausea or vomiting.  No chest pain  He relates that he has a history of a blood clot in his lower leg but stopped taking his blood thinner and also stopped using his diabetes medication about 6 months ago just because he did not want to take medication anymore but he does still get treatment for his leukemia  Physical Exam   Triage Vital Signs: ED Triage Vitals  Encounter Vitals Group     BP 04/19/23 1419 114/84     Systolic BP Percentile --      Diastolic BP Percentile --      Pulse Rate 04/19/23 1419 (!) 113     Resp 04/19/23 1419 19     Temp 04/19/23 1419 97.9 F (36.6 C)     Temp Source 04/19/23 1419 Oral     SpO2 04/19/23 1419 98 %     Weight 04/19/23 1417 220 lb 10.9 oz (100.1 kg)     Height 04/19/23 1417 6' 3 (1.905 m)     Head Circumference --      Peak Flow --      Pain Score 04/19/23 1417 0     Pain Loc --      Pain Education --      Exclude from Growth Chart --     Most recent vital signs: Vitals:   04/19/23 1419 04/19/23 1844  BP: 114/84   Pulse: (!) 113   Resp: 19   Temp: 97.9 F (36.6 C) 98.6 F (37 C)  SpO2: 98%      General: Awake, no distress.  Mucous membranes dry, he is fully alert and oriented CV:  Good peripheral perfusion.  Normal tones and rate.  Resp:  Normal effort.  Clear bilateral Abd:  No distention.  Soft nontender  nondistended Other:  Currently drinking water   ED Results / Procedures / Treatments   Labs (all labs ordered are listed, but only abnormal results are displayed) Labs Reviewed  RESP PANEL BY RT-PCR (RSV, FLU A&B, COVID)  RVPGX2 - Abnormal; Notable for the following components:      Result Value   Resp Syncytial Virus by PCR POSITIVE (*)    All other components within normal limits  CBC WITH DIFFERENTIAL/PLATELET - Abnormal; Notable for the following components:   WBC 49.6 (*)    RDW 15.6 (*)    Platelets 82 (*)    Lymphs Abs 44.3 (*)    Abs Immature Granulocytes 0.15 (*)    All other components within normal limits  COMPREHENSIVE METABOLIC PANEL - Abnormal; Notable for the following components:   Sodium 133 (*)    Chloride 97 (*)    Glucose, Bld 693 (*)    BUN 24 (*)    Creatinine, Ser 2.11 (*)  Total Bilirubin 2.6 (*)    GFR, Estimated 34 (*)    All other components within normal limits  BLOOD GAS, VENOUS - Abnormal; Notable for the following components:   Acid-base deficit 3.1 (*)    All other components within normal limits  BETA-HYDROXYBUTYRIC ACID - Abnormal; Notable for the following components:   Beta-Hydroxybutyric Acid 2.87 (*)    All other components within normal limits  PROTIME-INR - Abnormal; Notable for the following components:   Prothrombin Time 15.7 (*)    All other components within normal limits  APTT - Abnormal; Notable for the following components:   aPTT 23 (*)    All other components within normal limits  OSMOLALITY - Abnormal; Notable for the following components:   Osmolality 325 (*)    All other components within normal limits  CBG MONITORING, ED - Abnormal; Notable for the following components:   Glucose-Capillary >600 (*)    All other components within normal limits  CBG MONITORING, ED - Abnormal; Notable for the following components:   Glucose-Capillary 577 (*)    All other components within normal limits  HIV ANTIBODY (ROUTINE TESTING  W REFLEX)  HEMOGLOBIN A1C  BASIC METABOLIC PANEL  BASIC METABOLIC PANEL  BASIC METABOLIC PANEL  BASIC METABOLIC PANEL  CBC  SODIUM, URINE, RANDOM  CREATININE, URINE, RANDOM  CBG MONITORING, ED  CBG MONITORING, ED  CBG MONITORING, ED  CBG MONITORING, ED  CBG MONITORING, ED  CBG MONITORING, ED  TROPONIN I (HIGH SENSITIVITY)  TROPONIN I (HIGH SENSITIVITY)     EKG  And interpreted by me at 1250 heart rate 120 QRS 80 QTc 430 Significant artifact, but there is a diffuse T wave abnormality suggestive of subendocardial or potentially widespread ischemia versus repolarization abnormality.  Ongoing consideration for major metabolic abnormality is also considered.   RADIOLOGY X-ray interpreted by me as negative for acute pulmonary finding  DG Chest 2 View Result Date: 04/19/2023 CLINICAL DATA:  Cough/cold symptoms. EXAM: CHEST - 2 VIEW COMPARISON:  Chest x-ray 06/24/2022 FINDINGS: There is a trace right pleural effusion. There is no focal lung infiltrate, pleural effusion or pneumothorax. The cardiomediastinal silhouette is within normal limits. No acute fractures are seen. IMPRESSION: Trace right pleural effusion. Electronically Signed   By: Greig Pique M.D.   On: 04/19/2023 16:21       PROCEDURES:  Critical Care performed: Yes, see critical care procedure note(s)  CRITICAL CARE Performed by: Oneil Budge   Total critical care time: 30 minutes  Critical care time was exclusive of separately billable procedures and treating other patients.  Critical care was necessary to treat or prevent imminent or life-threatening deterioration.  Critical care was time spent personally by me on the following activities: development of treatment plan with patient and/or surrogate as well as nursing, discussions with consultants, evaluation of patient's response to treatment, examination of patient, obtaining history from patient or surrogate, ordering and performing treatments and  interventions, ordering and review of laboratory studies, ordering and review of radiographic studies, pulse oximetry and re-evaluation of patient's condition.  Patient has acute organ threatening acute kidney injury and severe hyperglycemia requiring IV fluid management and insulin  with hospitalization  Procedures   MEDICATIONS ORDERED IN ED: Medications  insulin  regular, human (MYXREDLIN ) 100 units/ 100 mL infusion (17 Units/hr Intravenous Rate/Dose Change 04/19/23 1850)  lactated ringers  infusion ( Intravenous New Bag/Given 04/19/23 1803)  dextrose  5 % in lactated ringers  infusion (has no administration in time range)  dextrose  50 % solution 0-50  mL (has no administration in time range)  apixaban  (ELIQUIS ) tablet 10 mg (10 mg Oral Given 04/19/23 1820)    Followed by  apixaban  (ELIQUIS ) tablet 5 mg (has no administration in time range)  sodium chloride  0.9 % bolus 1,000 mL (0 mLs Intravenous Stopped 04/19/23 1844)     IMPRESSION / MDM / ASSESSMENT AND PLAN / ED COURSE  I reviewed the triage vital signs and the nursing notes.                              Differential diagnosis includes, but is not limited to, hyperglycemia, hyperglycemic crisis HHS, DKA, viral infection given his mild cough runny nose, rule out pneumonia, etc.  Suspect much of this may be related to noncompliance with his medications especially given his severe hyperglycemia.  He is awake alert fully oriented  Labs reveal severe hyperglycemia no evidence of acidosis however he does have elevated beta hydroxybutyrate and also tested positive for RSV.  Also noted DVT but has no symptoms of be highly suggestive of thromboembolism of the lung  Patient's presentation is most consistent with acute presentation with potential threat to life or bodily function.   The patient is on the cardiac monitor to evaluate for evidence of arrhythmia and/or significant heart rate changes.  Ongoing care consulted with and patient accepted to  hospital service by Dr. Eldonna       FINAL CLINICAL IMPRESSION(S) / ED DIAGNOSES   Final diagnoses:  Hyperglycemia  Deep vein thrombosis (DVT) of right lower extremity, unspecified chronicity, unspecified vein (HCC)  AKI (acute kidney injury) (HCC)     Rx / DC Orders   ED Discharge Orders     None        Note:  This document was prepared using Dragon voice recognition software and may include unintentional dictation errors.   Dicky Anes, MD 04/19/23 [phone]

## 2023-04-19 NOTE — Assessment & Plan Note (Signed)
 Plt count in 80s in setting of CLL  Monitor w/ anticoagulation use

## 2023-04-19 NOTE — Assessment & Plan Note (Addendum)
 LE u/s + for acute on chronic DVT in the right lower extremity, Mild sequelae of chronic DVT noted in the right common femoral  Not compliant to home xarelto x 6 months  Case discussed w/ pharmacy Will plan to start eliquis   Discussed importance of medication compliance  Monitor

## 2023-04-19 NOTE — ED Notes (Signed)
 Endo tool utilized, no change to insulin drip 3.4 units/hr. Vitals stable. Will continue to monitor

## 2023-04-19 NOTE — ED Notes (Signed)
 See triage notes. Patient c/o dry mouth, polyuria, polydipsia, and throbbing to the right leg with mild amount of swelling. Patient has been non-compliant with medications times six months.

## 2023-04-19 NOTE — ED Notes (Signed)
 Pt states he had experienced one leg cramp but it passed very quickly. Pt educated about treatment and family members within room were made comfortable with a recliner, warm blankets, drinks and snacks.

## 2023-04-19 NOTE — ED Triage Notes (Signed)
 Pt has hx of NIDDM, DVT, & Leukemia and has not taken his medication x6 months. Pt c/o dry mouth, polyuria, polydipsia, and throbbing to R leg with mild amount of swelling. Pt also c/o URI sx.

## 2023-04-19 NOTE — Assessment & Plan Note (Signed)
 RSV positive today No active cough, shortness of breath or hypoxia Symptomatic management for now Monitor

## 2023-04-19 NOTE — Assessment & Plan Note (Signed)
 Followed by [facility] hem-on[facility]  WBC in 40s  Cont calquence

## 2023-04-19 NOTE — Consult Note (Addendum)
 PHARMACY - ANTICOAGULATION CONSULT NOTE  Pharmacy Consult for Apixaban  Indication: DVT  No Known Allergies  Patient Measurements: Height: 6' 3 (190.5 cm) Weight: 100.1 kg (220 lb 10.9 oz) IBW/kg (Calculated) : 84.5 Heparin  Dosing Weight: 100.1 kg  Vital Signs: Temp: 97.9 F (36.6 C) (01/06 1419) Temp Source: Oral (01/06 1419) BP: 114/84 (01/06 1419) Pulse Rate: 113 (01/06 1419)  Labs: Recent Labs    04/19/23 1419 04/19/23 1523  HGB 15.6  --   HCT 49.5  --   PLT 82*  --   APTT  --  23*  LABPROT  --  15.7*  INR  --  1.2  CREATININE 2.11*  --   TROPONINIHS 5  --     Estimated Creatinine Clearance: 41.7 mL/min (A) (by C-G formula based on SCr of 2.11 mg/dL (H)).   Medical History: Past Medical History:  Diagnosis Date   [name]    Cancer (HCC)    CLL monitored    Clotting disorder (HCC)    Diabetes mellitus without complication (HCC)    Type II   Hypertension     Medications:  Non-compliant with medications for the last 6 months per med rec tech. Rivaroxaban was last will 06/2022. Patient should no longer be on warfarin per chart review.  Assessment: LT is a 68 yo male who presented with throbbing leg, mild swelling. They've been non-compliant with medications for the last 6 months. They have a past medical history of significant for a history of DVT and Leukemia. Pharmacy has been consulted to manage and dose their heparin .   Plan:  Provider wanted to start patient on apixaban  in setting of AKI, renal function and platelets < 100 No procedures planned at this time Starting patient as new start apixaban  10 mg BID x 7 days then 5 mg BID for duration of therapy  Alfonso MARLA Buys, PharmD Pharmacy Resident  04/19/2023 5:37 PM

## 2023-04-19 NOTE — ED Notes (Signed)
 [place] tech at bedside

## 2023-04-20 ENCOUNTER — Telehealth (HOSPITAL_COMMUNITY): Payer: Self-pay | Admitting: Pharmacy Technician

## 2023-04-20 ENCOUNTER — Other Ambulatory Visit: Payer: Self-pay | Admitting: Obstetrics and Gynecology

## 2023-04-20 ENCOUNTER — Other Ambulatory Visit (HOSPITAL_COMMUNITY): Payer: Self-pay

## 2023-04-20 ENCOUNTER — Encounter (HOSPITAL_COMMUNITY): Payer: Self-pay | Admitting: Pharmacy Technician

## 2023-04-20 DIAGNOSIS — R739 Hyperglycemia, unspecified: Secondary | ICD-10-CM | POA: Diagnosis not present

## 2023-04-20 LAB — BASIC METABOLIC PANEL
Anion gap: 9 (ref 5–15)
Anion gap: 10 (ref 5–15)
BUN: 21 mg/dL (ref 8–23)
BUN: 17 mg/dL (ref 8–23)
CO2: 25 mmol/L (ref 22–32)
CO2: 23 mmol/L (ref 22–32)
Calcium: 8.9 mg/dL (ref 8.9–10.3)
Calcium: 8.8 mg/dL — ABNORMAL LOW (ref 8.9–10.3)
Chloride: 104 mmol/L (ref 98–111)
Chloride: 106 mmol/L (ref 98–111)
Creatinine, Ser: 1.64 mg/dL — ABNORMAL HIGH (ref 0.61–1.24)
Creatinine, Ser: 1.42 mg/dL — ABNORMAL HIGH (ref 0.61–1.24)
GFR, Estimated: 46 mL/min — ABNORMAL LOW (ref >60)
GFR, Estimated: 55 mL/min — ABNORMAL LOW (ref >60)
Glucose, Bld: 180 mg/dL — ABNORMAL HIGH (ref 70–99)
Glucose, Bld: 197 mg/dL — ABNORMAL HIGH (ref 70–99)
Potassium: 3.6 mmol/L (ref 3.5–5.1)
Potassium: 3.3 mmol/L — ABNORMAL LOW (ref 3.5–5.1)
Sodium: 138 mmol/L (ref 135–145)
Sodium: 139 mmol/L (ref 135–145)

## 2023-04-20 LAB — OSMOLALITY: Osmolality: 298 mosm/kg — ABNORMAL HIGH (ref 275–295)

## 2023-04-20 LAB — BETA-HYDROXYBUTYRIC ACID: Beta-Hydroxybutyric Acid: 0.47 mmol/L — ABNORMAL HIGH (ref 0.05–0.27)

## 2023-04-20 LAB — CBG MONITORING, ED
Glucose-Capillary: 191 mg/dL — ABNORMAL HIGH (ref 70–99)
Glucose-Capillary: 185 mg/dL — ABNORMAL HIGH (ref 70–99)
Glucose-Capillary: 163 mg/dL — ABNORMAL HIGH (ref 70–99)
Glucose-Capillary: 179 mg/dL — ABNORMAL HIGH (ref 70–99)
Glucose-Capillary: 176 mg/dL — ABNORMAL HIGH (ref 70–99)
Glucose-Capillary: 180 mg/dL — ABNORMAL HIGH (ref 70–99)
Glucose-Capillary: 203 mg/dL — ABNORMAL HIGH (ref 70–99)
Glucose-Capillary: 202 mg/dL — ABNORMAL HIGH (ref 70–99)

## 2023-04-20 LAB — HIV ANTIBODY (ROUTINE TESTING W REFLEX): HIV Screen 4th Generation wRfx: NONREACTIVE

## 2023-04-20 MED ORDER — POTASSIUM CHLORIDE CRYS ER 20 MEQ PO TBCR
20.0000 meq | EXTENDED_RELEASE_TABLET | Freq: Once | ORAL | Status: AC
  Administered 2023-04-20: 20 meq via ORAL
  Filled 2023-04-20: qty 1

## 2023-04-20 MED ORDER — GLIPIZIDE 10 MG PO TABS: 10.0000 mg | ORAL_TABLET | Freq: Two times a day (BID) | ORAL | 11 refills | Status: AC

## 2023-04-20 MED ORDER — POTASSIUM CHLORIDE CRYS ER 20 MEQ PO TBCR
40.0000 meq | EXTENDED_RELEASE_TABLET | Freq: Once | ORAL | Status: AC
  Administered 2023-04-20: 40 meq via ORAL
  Filled 2023-04-20: qty 2

## 2023-04-20 MED ORDER — LIVING WELL WITH DIABETES BOOK - IN SPANISH
Freq: Once | Status: AC
  Filled 2023-04-20: qty 1

## 2023-04-20 MED ORDER — INSULIN ASPART 100 UNIT/ML IJ SOLN
0.0000 [IU] | Freq: Three times a day (TID) | INTRAMUSCULAR | Status: DC
  Administered 2023-04-20: 5 [IU] via SUBCUTANEOUS
  Filled 2023-04-20: qty 1

## 2023-04-20 MED ORDER — INSULIN GLARGINE-YFGN 100 UNIT/ML ~~LOC~~ SOLN
20.0000 [IU] | Freq: Every day | SUBCUTANEOUS | Status: DC
  Administered 2023-04-20: 20 [IU] via SUBCUTANEOUS
  Filled 2023-04-20: qty 0.2

## 2023-04-20 MED ORDER — POTASSIUM CHLORIDE 10 MEQ/100ML IV SOLN: 10.0000 meq | INTRAVENOUS | Status: DC

## 2023-04-20 MED ORDER — POTASSIUM CHLORIDE CRYS ER 20 MEQ PO TBCR: 60.0000 meq | EXTENDED_RELEASE_TABLET | Freq: Once | ORAL | Status: DC

## 2023-04-20 MED ORDER — INSULIN STARTER KIT- PEN NEEDLES (SPANISH)
1.0000 | Freq: Once | Status: AC
  Administered 2023-04-20: 1
  Filled 2023-04-20: qty 1

## 2023-04-20 MED ORDER — INSULIN ASPART 100 UNIT/ML IJ SOLN
0.0000 [IU] | Freq: Every day | INTRAMUSCULAR | Status: DC
Start: 2023-04-20 — End: 2023-04-20

## 2023-04-20 MED ORDER — LINAGLIPTIN 5 MG PO TABS: 5.0000 mg | ORAL_TABLET | Freq: Every day | ORAL | 1 refills | Status: AC

## 2023-04-20 MED ORDER — APIXABAN 5 MG PO TABS: ORAL_TABLET | ORAL | 3 refills | Status: AC

## 2023-04-20 NOTE — Telephone Encounter (Addendum)
 Pharmacy Patient Advocate Encounter  Insurance verification completed.    The patient is insured through [facility].   Ran test claim for Eliquis  and the current 30 day co-pay is $35.00. Ran test claim for Jardiance and the current 30 day co-pay is $35.00. Ran test claim for Anthony Hart and the current 30 day co-pay is $35.00. Ran test claim for Tradjenta  and the current 30 day co-pay is non-formulary.  This test claim was processed through [facility]- copay amounts may vary at other pharmacies due to pharmacy/plan contracts, or as the patient moves through the different stages of their insurance plan.

## 2023-04-20 NOTE — Discharge Summary (Addendum)
 Anthony Hart. 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 is a 68 y.o. male with medical history significant of CLL, T2DM, chronic VTE presenting with hyperglycemia, recurrent DVT, medication noncompliance, thrombocytopenia, RSV.  Patient reports having not taken his medication for roughly 6 months.  Medications included Xarelto as well as diabetic regimen.  Patient has been compliant with his CLL medication Calquence.  Patient states he had issues with the pharmacy and the supply of the medication he was getting.  Has not been checking blood sugars over this timeframe.  Has had worsening polyuria, polydipsia as well as lower extremity swelling over several days.  No chest pain or shortness of breath reported.  No abdominal pain.  No focal hemiparesis or confusion.  Non-smoker.  Denies any alcohol or drug use.  No active HI/SI.   Hospital Course:  Patient was sent to the hospital by his pcp for abnormal labs, presumably hyperglycemia. On arrival hyperglycemic to the 600s, no signs dka or hhs. Treated with fluids and IV insulin . His only complaint is mild cough, no hypoxia, tested positive for rsv here. Also noted to have  RLE swelling greater than left, PVL positive for acute on chronic dvt. Patient has not been taking home meds including diabetes medications and anticoagulation for greater than a month. No dyspnea or hypoxia to suggest hemodynamically significant PE. We started apixaban  and insulin . 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 He requests discharge with resumption of previous glipizide , will add tradjenta  to that. We have started the patient on apixaban . Also had a mild AKI that resolved with fluids, he is tolerating PO. We discussed that his current acute medical problems are a consequence of not taking medications as prescribed and not following up with medical providers as recommended. He says he has close follow-up with his oncologist already scheduled, and we advised he schedule close follow up with his pcp for ongoing management of his diabetes.   Procedures: none   Consultations: DM educator  Discharge Exam: Vitals:   04/20/23 1200 04/20/23 1215  BP:  131/82  Pulse: 95   Resp: 12   Temp:    SpO2: 99%     General: NAD Cardiovascular: RRR Respiratory: CTAB  Discharge Instructions   Discharge Instructions     Diet Carb Modified   Complete by: As directed    Increase activity slowly   Complete by: As directed       Allergies as of 04/20/2023   No Known Allergies      Medication List     STOP taking these medications    albuterol  108 (90 Base) MCG/ACT inhaler Commonly known as: VENTOLIN  HFA   atorvastatin  80 MG tablet Commonly known as: LIPITOR   azithromycin  250 MG tablet Commonly known as: Zithromax  Z-Pak   brompheniramine-pseudoephedrine-DM 30-2-10 MG/5ML syrup   ibuprofen  600 MG tablet Commonly known as: ADVIL    metFORMIN  750 MG 24 hr tablet Commonly known as: GLUCOPHAGE -XR   warfarin 7.5 MG tablet Commonly known as: COUMADIN        TAKE these medications    apixaban  5 MG Tabs tablet Commonly known as:  ELIQUIS  Take 2 tabs twice a day for 7 days, then 1 tab twice a day   glipiZIDE  10 MG tablet Commonly known as: GLUCOTROL  Take 1 tablet (10 mg total) by mouth 2 (two) times daily.   linagliptin  5 MG Tabs tablet Commonly known as: Tradjenta  Take 1 tablet (5 mg total) by mouth daily.       No Known Allergies  Follow-up Information     Eliverto Bette Hover, MD Follow up.   Specialty: Family Medicine Contact information: [address] [place] [address] [phone]         Your oncologist Follow up.   Why: as scheduled                 The results of significant diagnostics from this hospitalization (including imaging, microbiology, ancillary and laboratory) are listed below for reference.    Significant Diagnostic Studies: US  RENAL Result Date: 04/19/2023 CLINICAL DATA:  Acute kidney injury EXAM: RENAL / URINARY TRACT ULTRASOUND COMPLETE COMPARISON:  CT abdomen and pelvis dated 10/31/2016 FINDINGS: Right Kidney: Length = 12.2 cm Normal parenchymal echogenicity with preserved corticomedullary differentiation. No urinary tract dilation or shadowing calculi. The ureter is not seen. Left Kidney: Length = 12.6 cm Normal parenchymal echogenicity with preserved corticomedullary differentiation. No urinary tract dilation or shadowing calculi. The ureter is not seen. Bladder: Appears normal for degree of bladder distention. Other: Enlarged spleen measures 16.8 cm, previously 15.4 cm. Diffusely increased hepatic echogenicity can be seen in the setting of infiltrative process such as steatosis. IMPRESSION: 1. No urinary tract dilation or shadowing calculi. Normal echogenicity of the kidneys. 2. Splenomegaly. 3. Diffusely increased hepatic echogenicity can be seen in the setting of infiltrative process such as steatosis. Electronically Signed   By: Limin  Xu M.D.   On: 04/19/2023 18:53   US  Venous Img Lower Bilateral Result Date: 04/19/2023 CLINICAL DATA:  Bilateral lower  extremity edema. History of known right lower extremity DVT. Patient has been noncompliant with anticoagulation. EXAM: BILATERAL LOWER EXTREMITY VENOUS DOPPLER ULTRASOUND TECHNIQUE: Gray-scale sonography with graded compression, as well as color Doppler and duplex ultrasound were performed to evaluate the lower extremity deep venous systems from the level of the common femoral vein and including the common femoral, femoral, profunda femoral, popliteal and calf veins including the posterior tibial, peroneal and gastrocnemius veins when visible. The superficial great saphenous vein was also interrogated. Spectral Doppler was utilized to evaluate flow at rest and with distal augmentation maneuvers in the common femoral, femoral and popliteal veins. COMPARISON:  None Available. FINDINGS: RIGHT LOWER EXTREMITY Common Femoral Vein: Incompletely compressible with echogenic cine key a in the center of the vessel. Color flow is present on color Doppler imaging. Findings are consistent with sequelae of chronic DVT. Saphenofemoral Junction: No evidence of thrombus. Normal compressibility and flow on color Doppler imaging. Profunda Femoral Vein: Positive color flow on color Doppler imaging. Perhaps trace chronic DVT. Femoral Vein: Absent color flow throughout the femoral vein. Popliteal Vein: Absent color flow in the popliteal vein. Calf Veins: Limited evaluation. Superficial  Great Saphenous Vein: No evidence of thrombus. Normal compressibility. Venous Reflux:  None. Other Findings:  None. LEFT LOWER EXTREMITY Common Femoral Vein: No evidence of thrombus. Normal compressibility, respiratory phasicity and response to augmentation. Saphenofemoral Junction: No evidence of thrombus. Normal compressibility and flow on color Doppler imaging. Profunda Femoral Vein: No evidence of thrombus. Normal compressibility and flow on color Doppler imaging. Femoral Vein: No evidence of thrombus. Normal compressibility, respiratory phasicity and  response to augmentation. Popliteal Vein: No evidence of thrombus. Normal compressibility, respiratory phasicity and response to augmentation. Calf Veins: No evidence of thrombus. Normal compressibility and flow on color Doppler imaging. Superficial Great Saphenous Vein: No evidence of thrombus. Normal compressibility. Venous Reflux:  None. Other Findings:  None. IMPRESSION: 1. Probable acute on chronic DVT in the right lower extremity with absent color flow in the femoral and popliteal veins suggesting acute occlusive thrombus. 2. Mild sequelae of chronic DVT noted in the right common femoral vein. 3. No evidence of acute DVT in the left lower extremity. Electronically Signed   By: Wilkie Lent M.D.   On: 04/19/2023 17:00   DG Chest 2 View Result Date: 04/19/2023 CLINICAL DATA:  Cough/cold symptoms. EXAM: CHEST - 2 VIEW COMPARISON:  Chest x-ray 06/24/2022 FINDINGS: There is a trace right pleural effusion. There is no focal lung infiltrate, pleural effusion or pneumothorax. The cardiomediastinal silhouette is within normal limits. No acute fractures are seen. IMPRESSION: Trace right pleural effusion. Electronically Signed   By: Greig Pique M.D.   On: 04/19/2023 16:21    Microbiology: Recent Results (from the past 240 hours)  Resp panel by RT-PCR (RSV, Flu A&B, Covid) Anterior Nasal Swab     Status: Abnormal   Collection Time: 04/19/23  3:23 PM   Specimen: Anterior Nasal Swab  Result Value Ref Range Status   SARS Coronavirus 2 by RT PCR NEGATIVE NEGATIVE Final    Comment: (NOTE) SARS-CoV-2 target nucleic acids are NOT DETECTED.  The SARS-CoV-2 RNA is generally detectable in upper respiratory specimens during the acute phase of infection. The lowest concentration of SARS-CoV-2 viral copies this assay can detect is 138 copies/mL. A negative result does not preclude SARS-Cov-2 infection and should not be used as the sole basis for treatment or other patient management decisions. A negative  result may occur with  improper specimen collection/handling, submission of specimen other than nasopharyngeal swab, presence of viral mutation(s) within the areas targeted by this assay, and inadequate number of viral copies(<138 copies/mL). A negative result must be combined with clinical observations, patient history, and epidemiological information. The expected result is Negative.  Fact Sheet for Patients:  bloggercourse.com  Fact Sheet for Healthcare Providers:  seriousbroker.it  This test is no t yet approved or cleared by the [place]  FDA and  has been authorized for detection and/or diagnosis of SARS-CoV-2 by FDA under an Emergency Use Authorization (EUA). This EUA will remain  in effect (meaning this test can be used) for the duration of the COVID-19 declaration under Section 564(b)(1) of the Act, 21 U.S.C.section 360bbb-3(b)(1), unless the authorization is terminated  or revoked sooner.       Influenza A by PCR NEGATIVE NEGATIVE Final   Influenza B by PCR NEGATIVE NEGATIVE Final    Comment: (NOTE) The Xpert Xpress SARS-CoV-2/FLU/RSV plus assay is intended as an aid in the diagnosis of influenza from Nasopharyngeal swab specimens and should not be used as a sole basis for treatment. Nasal washings and aspirates are unacceptable for Xpert Xpress SARS-CoV-2/FLU/RSV testing.  Fact Sheet for Patients: bloggercourse.com  Fact Sheet for Healthcare Providers: seriousbroker.it  This test is not yet approved or cleared by the [place]  FDA and has been authorized for detection and/or diagnosis of SARS-CoV-2 by FDA under an Emergency Use Authorization (EUA). This EUA will remain in effect (meaning this test can be used) for the duration of the COVID-19 declaration under Section 564(b)(1) of the Act, 21 U.S.C. section 360bbb-3(b)(1), unless the authorization is  terminated or revoked.     Resp Syncytial Virus by PCR POSITIVE (A) NEGATIVE Final    Comment: (NOTE) Fact Sheet for Patients: bloggercourse.com  Fact Sheet for Healthcare Providers: seriousbroker.it  This test is not yet approved or cleared by the [place]  FDA and has been authorized for detection and/or diagnosis of SARS-CoV-2 by FDA under an Emergency Use Authorization (EUA). This EUA will remain in effect (meaning this test can be used) for the duration of the COVID-19 declaration under Section 564(b)(1) of the Act, 21 U.S.C. section 360bbb-3(b)(1), unless the authorization is terminated or revoked.  Performed at [facility], [address] [place], [place] [address]      Labs: Basic Metabolic Panel: Recent Labs  Lab 04/19/23 1419 04/19/23 1826 04/19/23 2045 04/20/23 0313 04/20/23 0845  NA 133* 130* 137 138 139  K 4.8 4.3 3.8 3.6 3.3*  CL 97* 96* 100 104 106  CO2 23 21* 24 25 23   GLUCOSE 693* 684* 343* 180* 197*  BUN 24* 27* 25* 21 17  CREATININE 2.11* 1.87* 1.86* 1.64* 1.42*  CALCIUM  9.7 8.8* 9.4 8.9 8.8*   Liver Function Tests: Recent Labs  Lab 04/19/23 1419  AST 15  ALT 18  ALKPHOS 98  BILITOT 2.6*  PROT 7.3  ALBUMIN 4.4   No results for input(s): LIPASE, AMYLASE in the last 168 hours. No results for input(s): AMMONIA in the last 168 hours. CBC: Recent Labs  Lab 04/19/23 1419  WBC 49.6*  NEUTROABS 4.6  HGB 15.6  HCT 49.5  MCV 93.0  PLT 82*   Cardiac Enzymes: No results for input(s): CKTOTAL, CKMB, CKMBINDEX, TROPONINI in the last 168 hours. BNP: BNP (last 3 results) No results for input(s): BNP in the last 8760 hours.  ProBNP (last 3 results) No results for input(s): PROBNP in the last 8760 hours.  CBG: Recent Labs  Lab 04/20/23 0423 04/20/23 0608 04/20/23 0802 04/20/23 1005 04/20/23 1133  GLUCAP 179* 176* 180* 203* 202*        Signed:  Devaughn KATHEE Ban MD.  [facility] 04/20/2023, 1:45 PM

## 2023-04-20 NOTE — Inpatient Diabetes Management (Addendum)
 Inpatient Diabetes Program Recommendations  AACE/ADA: New Consensus Statement on Inpatient Glycemic Control   Target Ranges:  Prepandial:   less than 140 mg/dL      Peak postprandial:   less than 180 mg/dL (1-2 hours)      Critically ill patients:  140 - 180 mg/dL    Latest Reference Range & Units 04/19/23 20:42 04/19/23 21:39 04/19/23 22:49 04/19/23 23:49 04/20/23 01:03 04/20/23 01:56 04/20/23 03:16 04/20/23 04:23 04/20/23 06:08 04/20/23 08:02  Glucose-Capillary 70 - 99 mg/dL 712 (H) 790 (H) 813 (H) 187 (H) 191 (H) 185 (H) 163 (H) 179 (H) 176 (H) 180 (H)    Latest Reference Range & Units 04/19/23 14:19 04/19/23 18:26 04/19/23 20:45 04/20/23 03:13  CO2 22 - 32 mmol/L 23 21 (L) 24 25  Glucose 70 - 99 mg/dL 306 (HH) 315 (HH) 656 (H) 180 (H)  BUN 8 - 23 mg/dL 24 (H) 27 (H) 25 (H) 21  Creatinine 0.61 - 1.24 mg/dL 7.88 (H) 8.12 (H) 8.13 (H) 1.64 (H)  Calcium  8.9 - 10.3 mg/dL 9.7 8.8 (L) 9.4 8.9  Anion gap 5 - 15  13 13 13 9     Latest Reference Range & Units 04/19/23 15:23 04/20/23 03:37  Beta-Hydroxybutyric Acid 0.05 - 0.27 mmol/L 2.87 (H) 0.47 (H)   Review of Glycemic Control  Diabetes history: DM2 Outpatient Diabetes medications: None; per PCP note on 05/25/22 patient prescribed Glipizide  XL 10 mg BID and Metformin  1000 mg BID Current orders for Inpatient glycemic control: IV insulin   Inpatient Diabetes Program Recommendations:    Insulin : Once provider is ready to transition from IV to SQ insulin , please consider ordering Semglee  20 units Q24H (based on 100 kg x 0.2 units), CBGs Q4H, and Novolog  0-15 units Q4H.  HbgA1C:  A1C in process.  Outpatient DM: Patient is not willing to take insulin  outpatient.  At time of discharge, please provide Rx for Glipizide  10 mg BID and Tradjenta  5 mg daily. Patient will also need Rx for glucose monitoring kit (#[id number]).  NOTE: Patient admitted hyperglycemia, recurrent DVT, AKI, RSV, and has hx of leukemia. Initial lab glucose 693 mg/dl on 11/13/72.  Per H&P, patient has not taken DM medication in over 6 months. In reviewing chart, noted patient last seen PCP 05/25/22 and per office note patient was prescribed Glipizide  XL 10 mg BID and Metformin  1000 mg BID and it was noted that patient was not taking Metformin . Will plan to follow up with patient regarding DM control.  Addendum 04/20/23@11 :10-Spoke with patient at bedside in ED along with on campus Spanish interpreter. Patient stated he did not need an interpreter as he speaks and understands English. Patient reports that he has DM2 hx and he was taking Glipizide  but has not taken any in 6 months. 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 Discussed that some of the laws have changed where people with CDLs can take insulin  but they have to see provider consistently and keep more detailed logs. Patient states he is not willing to take insulin  and wants to take oral DM medications. Patient states he will resume taking Glipizide  if prescribed. Explained that initial glucose 693 mg/dl and with the amount of insulin  he has required on IV insulin , he will likely need more than Glipizide . Patient states he use to Metformin  but stopped due to excessive diarrhea and not willing to take it. Patient states he would be willing to take Glipizide  and another oral DM medication if prescribed.  Patient knows how to check glucose and use glucometer but does not currently have a glucometer or testing supplies. Informed patient that current A1C has not resulted. Encouraged patient to check his MyChart to get A1C results if not informed of results prior to discharge.  Discussed glucose and A1C goals. Discussed importance of checking CBGs and maintaining good CBG control to prevent long-term and short-term complications. Explained how hyperglycemia leads to damage within blood vessels which lead to the common complications seen with uncontrolled diabetes. Stressed to the patient the importance of  improving glycemic control to prevent further complications from uncontrolled diabetes. Discussed impact of nutrition, exercise, stress, sickness, and medications on diabetes control.  Discussed that per chart, patient last seen PCP on 05/25/22. Asked patient to get appointment to follow up with PCP in the near future and encouraged to see provider consistently so he can continue to get needed medications and assistance with managing DM.  Patient mentions that he is suppose to take blood thinner and medication to prevent heart attack and asked that he get Rx for them at discharge. Informed patient that I would pass the information along to MD.  Patient verbalized understanding of information discussed and reports no further questions at this time related to diabetes. Sent chat message to Dr. Kandis regarding patient not willing to take insulin  and recommendations for oral DM medication; also let him know that patient reported he needed Rx for blood thinner and medication to prevent heart attack that he had been on.   Thanks, Earnie Gainer, RN, MSN, CDCES Diabetes Coordinator Inpatient Diabetes Program [phone] (Team Pager from 8am to 5pm)

## 2023-04-20 NOTE — ED Notes (Signed)
 3.4 units/hr of insulin, no change in dose. Endo tool used.

## 2023-04-20 NOTE — Telephone Encounter (Signed)
 error

## 2023-04-20 NOTE — Telephone Encounter (Signed)
 Patient Product/process Development Scientist completed.    The patient is insured through [facility]. Patient has [employer], may use a copay card, and/or apply for patient assistance if available.    Ran test claim for Lantus  Pen and the current 30 day co-pay is $35.00.  Ran test claim for Novolog  FlexPen and the current 30 day co-pay is $35.00.  This test claim [facility] processed through  [facility]- copay amounts may vary at other pharmacies due to pharmacy/plan contracts, or as the patient moves through the different stages of their insurance plan.     Reyes Sharps, CPHT Pharmacy Technician III Certified Patient Advocate [facility] Patient Advocate Team Direct Number: [phone]  Fax: [phone]

## 2023-04-21 LAB — HEMOGLOBIN A1C
Hgb A1c MFr Bld: 13.7 % — ABNORMAL HIGH (ref 4.8–5.6)
Mean Plasma Glucose: 346 mg/dL

## 2023-04-22 NOTE — Unmapped (Signed)
TC to patient to check on him as he has missed his appt today with Dr. Barbette Merino. No answer, left VM.    Thanks    [employer]

## 2023-04-23 LAB — BLOOD GAS, VENOUS
Acid-base deficit: 3.1 mmol/L — ABNORMAL HIGH (ref 0.0–2.0)
Bicarbonate: 23.2 mmol/L (ref 20.0–28.0)
O2 Saturation: 41.5 %
Patient temperature: 37
pCO2, Ven: 45 mm[Hg] (ref 44–60)
pH, Ven: 7.32 (ref 7.25–7.43)

## 2023-04-30 NOTE — Unmapped (Signed)
[facility] Refill Coordination Note    Specialty Medication(s) to be Shipped:   Hematology/Oncology: Calquence    Other medication(s) to be shipped: No additional medications requested for fill at this time     Scott Fox., DOB: 1955-07-25  Phone: [phone] (home)       All above HIPAA information was verified with patient.     Was a Nurse, learning disability used for this call? No    Completed refill call assessment today to schedule patient's medication shipment from the [facility]  [phone]).  All relevant notes have been reviewed.     Specialty medication(s) and dose(s) confirmed: Regimen is correct and unchanged.   Changes to medications: Scott Fox reports no changes at this time.  Changes to insurance: No  New side effects reported not previously addressed with a pharmacist or physician: None reported  Questions for the pharmacist: No    Confirmed patient received a Conservation officer, historic buildings and a Surveyor, mining with first shipment. The patient will receive a drug information handout for each medication shipped and additional FDA Medication Guides as required.       DISEASE/MEDICATION-SPECIFIC INFORMATION        N/A    SPECIALTY MEDICATION ADHERENCE     Medication Adherence    Patient reported X missed doses in the last month: 0  Specialty Medication: Calquence 100 mg  Patient is on additional specialty medications: No  Informant: patient              Were doses missed due to medication being on hold? No     acalabrutinib: CALQUENCE 100 mg tablet: 6 days of medicine on hand       REFERRAL TO PHARMACIST     Referral to the pharmacist: Not needed      [facility]     Shipping address confirmed in Epic.       Delivery Scheduled: Yes, Expected medication delivery date: 05/06/23.     Medication will be delivered via Next Day Courier to the prescription address in Epic WAM.    Craige Cotta   [facility]  Specialty Technician

## 2023-05-03 DIAGNOSIS — C9110 Chronic lymphocytic leukemia of B-cell type not having achieved remission: Principal | ICD-10-CM

## 2023-05-05 MED FILL — CALQUENCE (ACALABRUTINIB MALEATE) 100 MG TABLET: ORAL | 30 days supply | Qty: 60 | Fill #5

## 2023-05-06 NOTE — Unmapped (Signed)
TC to patient to check on him as he has missed his recent appt with Dr. Barbette Merino. No answer, left VM.     Thanks     [employer]

## 2023-05-27 NOTE — Unmapped (Signed)
I called the pt to reschedule his no show appt with Barbette Merino, no answer no vm

## 2023-05-28 ENCOUNTER — Telehealth: Payer: Self-pay | Admitting: Oncology

## 2023-06-01 NOTE — Unmapped (Signed)
 The [facility] has made a second and final attempt to reach this patient to refill the following medication:Calquence.      We have left voicemails on the following phone numbers: [phone], have sent a MyChart message, have sent a text message to the following phone numbers: [phone], and have sent a Mychart questionnaire..    Dates contacted: 2/11,18  Last scheduled delivery: 05/05/23    The patient may be at risk of non-compliance with this medication. The patient should call the [facility] at [phone]  Option 4, then Option 1: Oncology to refill medication.    [facility] Specialty Technician

## 2023-06-10 NOTE — Unmapped (Signed)
I left a message and the clinic number for the pt to call back and reschedule with Barbette Merino

## 2023-06-10 NOTE — Unmapped (Signed)
 [facility] Other Call ONC Phone Room Smart Lists: Cancellation/Reschedule -     Hi ,    Patient Scott Fox. contacted the Communication Center to reschedule their appointment for 04/22/23.  The original appointment has been cancelled. Next available appt isn't until 08/12/23.  Patient asked if he could see NP or PA. Advised there as no cancellation list.     Cancellation Reason: No show appt    Patient's appointment requires reschedule.    Thank you,  Cipriano Mile  [facility]   [phone]    [facility]

## 2023-06-15 NOTE — Unmapped (Signed)
 [facility] Other Call ONC Phone Room Smart Lists: General -     Hi,     Scott Fox contacted the Communication Fox regarding the following:    - Scott Fox is calling requesting to speak w/ Scott Fox.     Please contact Scott Fox back at [phone].    Thanks in advance,    Scott Fox  Scott Fox   [phone]    [facility]

## 2023-06-15 NOTE — Unmapped (Signed)
 Scott Fox. Is past due for refill of acalabrutinib. At this time, they have declined refill due to  having plenty on hand.  He communicated this message to his clinic team and stated he would call [facility] for a refill when needed . Refill assessment call date has been updated per the patient's request. We will contact him after his follow up appointment in April.  We will wait for patient to call to schedule delivery for his refill    Horace Porteous, PharmD  [facility]

## 2023-08-05 ENCOUNTER — Ambulatory Visit: Admit: 2023-08-05 | Discharge: 2023-08-05 | Payer: PRIVATE HEALTH INSURANCE

## 2023-08-05 DIAGNOSIS — D849 Immunodeficiency, unspecified: Principal | ICD-10-CM

## 2023-08-05 DIAGNOSIS — Z7901 Long term (current) use of anticoagulants: Principal | ICD-10-CM

## 2023-08-05 DIAGNOSIS — C9110 Chronic lymphocytic leukemia of B-cell type not having achieved remission: Principal | ICD-10-CM

## 2023-08-05 DIAGNOSIS — E119 Type 2 diabetes mellitus without complications: Principal | ICD-10-CM

## 2023-08-05 DIAGNOSIS — Z86718 Personal history of other venous thrombosis and embolism: Principal | ICD-10-CM

## 2023-08-05 LAB — COMPREHENSIVE METABOLIC PANEL
ALBUMIN: 4.2 g/dL (ref 3.4–5.0)
ALKALINE PHOSPHATASE: 127 U/L — ABNORMAL HIGH (ref 46–116)
ALT (SGPT): 8 U/L — ABNORMAL LOW (ref 10–49)
ANION GAP: 10 mmol/L (ref 5–14)
AST (SGOT): 9 U/L (ref <=34)
BILIRUBIN TOTAL: 3.2 mg/dL — ABNORMAL HIGH (ref 0.3–1.2)
BLOOD UREA NITROGEN: 20 mg/dL (ref 9–23)
BUN / CREAT RATIO: 14
CALCIUM: 10 mg/dL (ref 8.7–10.4)
CHLORIDE: 97 mmol/L — ABNORMAL LOW (ref 98–107)
CO2: 29.7 mmol/L (ref 20.0–31.0)
CREATININE: 1.43 mg/dL — ABNORMAL HIGH (ref 0.73–1.18)
EGFR CKD-EPI (2021) MALE: 54 mL/min/1.73m2 — ABNORMAL LOW (ref >=60)
GLUCOSE RANDOM: 649 mg/dL (ref 70–179)
POTASSIUM: 5 mmol/L — ABNORMAL HIGH (ref 3.4–4.8)
PROTEIN TOTAL: 6.9 g/dL (ref 5.7–8.2)
SODIUM: 137 mmol/L (ref 135–145)

## 2023-08-05 LAB — CBC W/ AUTO DIFF
BASOPHILS ABSOLUTE COUNT: 0.1 10*9/L (ref 0.0–0.1)
BASOPHILS RELATIVE PERCENT: 0.3 %
EOSINOPHILS ABSOLUTE COUNT: 0.1 10*9/L (ref 0.0–0.5)
EOSINOPHILS RELATIVE PERCENT: 0.5 %
HEMATOCRIT: 45 % (ref 39.0–48.0)
HEMOGLOBIN: 14.9 g/dL (ref 12.9–16.5)
LYMPHOCYTES ABSOLUTE COUNT: 13.5 10*9/L — ABNORMAL HIGH (ref 1.1–3.6)
LYMPHOCYTES RELATIVE PERCENT: 77.6 %
MEAN CORPUSCULAR HEMOGLOBIN CONC: 33.2 g/dL (ref 32.0–36.0)
MEAN CORPUSCULAR HEMOGLOBIN: 30.2 pg (ref 25.9–32.4)
MEAN CORPUSCULAR VOLUME: 91 fL (ref 77.6–95.7)
MEAN PLATELET VOLUME: 8.4 fL (ref 6.8–10.7)
MONOCYTES ABSOLUTE COUNT: 0.3 10*9/L (ref 0.3–0.8)
MONOCYTES RELATIVE PERCENT: 1.9 %
NEUTROPHILS ABSOLUTE COUNT: 3.4 10*9/L (ref 1.8–7.8)
NEUTROPHILS RELATIVE PERCENT: 19.7 %
NUCLEATED RED BLOOD CELLS: 0 /100{WBCs} (ref <=4)
PLATELET COUNT: 64 10*9/L — ABNORMAL LOW (ref 150–450)
RED BLOOD CELL COUNT: 4.94 10*12/L (ref 4.26–5.60)
RED CELL DISTRIBUTION WIDTH: 15.1 % (ref 12.2–15.2)
WBC ADJUSTED: 17.4 10*9/L — ABNORMAL HIGH (ref 3.6–11.2)

## 2023-08-05 LAB — SLIDE REVIEW

## 2023-08-05 MED ORDER — RIVAROXABAN 10 MG TABLET
ORAL_TABLET | Freq: Every day | ORAL | 2 refills | 90.00 days | Status: CP
Start: 2023-08-05 — End: ?

## 2023-08-05 MED ORDER — GLIPIZIDE ER 10 MG TABLET, EXTENDED RELEASE 24 HR
ORAL_TABLET | Freq: Two times a day (BID) | ORAL | 0 refills | 15.00 days | Status: CP
Start: 2023-08-05 — End: 2024-08-04

## 2023-08-05 MED ORDER — ACALABRUTINIB MALEATE 100 MG TABLET
ORAL_TABLET | Freq: Two times a day (BID) | ORAL | 11 refills | 30.00 days | Status: CP
Start: 2023-08-05 — End: ?

## 2023-08-05 NOTE — Unmapped (Addendum)
 [facility]     Patient Name: Scott Fox.  Patient Age: 68 y.o.  Encounter Date: 08/05/2023    Primary Care Provider:  Baltazar Leventhal, MD    Referring Physician:  Cherly Corners*      REASON FOR VISIT     68 y.o. male who is seen in follow up for CLL.    ASSESSMENT     Diagnosis: CLL  Risk stratification:  - IGHV: mutated [06/07/18]  - Normal cytogenetics: negative FISH panel [06/07/18], normal karyotype [08/19/18]  - TP53: unmutated [08/19/18]  - Stage: Rai stage IV: lymphocytosis + thrombocytopenia (<100)    Presentation: Leukocytosis noted in 2004  Treatment history:   0) 2004-Present: Observation    Current treatment: First-line therapy - Acalabrutinib  [begun 07/2022]:  - Acalabrutinib  100 mg IBID  Toxicities: No toxicities or complications.  Therapy interruptions: None  Response:  Baseline: WBC 120s-150s; hemoglobin 11s; platlets 70-80; marked cervical LAD [07/2022]  Best response: PR - WBC down to 50s, hemoglobin normalized, stable thrombocytopenia, LAD [12/17/22]      Other Problems:  Recurrent VTEs  CKD  Chronic diarrhea  Recurrent VTE  Hyperkalemia, mild, favoring pseudohyperkalemia in setting of hyperleukocytosis    CLL:  Thrombocytopenia:  Anemia, improved:  Hemoptysis, scant:  Herpes labialis:  Scott Fox is a 68 y.o. man with a long history of CLL initially diagnosed ~ 2004, on observation since that time but lost to follow up in the [facility] system in 2020.  He re-established with a new PCP in 04/2021, at which time he was noted to have hyperleukocytosis and modest thrombocytopenia.     Previously seen for acute visit for marked facial / neck swelling. 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 He has a large nodal conglomerate under his jaw.  This may be partly a manifestation of local inflammation / infection; however, given he has other indication for treatment (thrombocytopenia, splenomegaly), we started CLL therapy.    Currently has achieved a PR with resolution of LAD.    Continue Acalabrutinib .     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 Recommended CT urogram and urology evaluation. CT urogram reassuring. He has not yet seen urology.     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  He has been maintained on Coumadin  followed by Rivaroxaban , though he is not currently taking this. This history may complicate treatment with a BTK inhibitor for CLL, particularly given prior bleeding complications (hematuria, hemoptysis). We have previously referred him to our benign hematology colleagues, though he did not keep this appointment. On Xarelto  for anticoagulation. Potential interaction with acalabrutinib , as both can increase bleeding risk. No bleeding symptoms reported. Xarelto  is a true anticoagulant, while acalabrutinib  affects platelet function, both interfering with clotting.    Chronic diarrhea:   Patient reports a multiyear history of loose stools or diarrhea following oral intake.  He states this symptom has not changed recently, though he request a GI referral for further evaluation.  I think this is reasonable we will place that referral.    DM:  Profound hyperglycemia without acidosis:  Type 2 Diabetes Mellitus managed with glipizide . Recently not taking medication. Refill glipizide  as a one-time prescription. Advise follow-up with primary care provider for diabetes management.    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 While enhanced screening is not recommended, standard screening guidelines should be closely followed.  Annual dermatology evaluation, due to increased risk of non-melanomatous skin cancers.   If needed, blood products for transfusion should be irradiated.  Risk of Hypogammaglobulinemia: No history of recurrent infections.  Vaccination  Avoid all live vaccines.  Recommend annual influenza vaccine.  Recommend pneumococcal vaccine series.  Recommend Zoster vaccine recombinant, adjuvanted (Shingrix).  Recommend updated updated COVID vaccine.  Antimicrobial prophylaxis:   Antimicrobials: None  GCSF: No  Access / Line: Peripheral  Interim bloodwork: none        PLAN     - Coordinate with pharmacy to facilitate acalabrutinib  refill through Medicaid.  - Resume regular acalabrutinib  dosing to improve CLL control.  - Vaccination as above  - Reduce Xarelto  dose to 10 mg due to concurrent use with acalabrutinib .    RTC in 4 months.    Gifford Kuster, MD  Division of Hematology        INTERVAL HISTORY:     Scott Fox. is a 68 year old male with chronic lymphocytic leukemia (CLL) who presents for follow-up.    In the interval, missed follow up.    He is being maintained on acalabrutinib  and has achieved a partial response, although he was lost to follow-up for the last few months. He has not been consistently taking acalabrutinib  due to a change in insurance coverage, now being on Medicaid. He last took acalabrutinib  this morning but prior to that, he was taking it approximately once a week. His blood counts show a platelet count of 64 and a white blood cell count of 17, which is higher than normal but improved from previous levels. No new lymphadenopathy, abdominal swelling, or fullness. Occasional right-sided pain without tenderness on palpation. No groin swelling.    He feels 'pretty good' overall but notes recent changes, including retirement last week. He has experienced unintentional weight loss, now weighing 211 pounds. 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 He needs to use the bathroom every time he eats, which was a concern while on the road.    He reports a blood pressure reading of 89, feeling 'a little weak' but no dizziness or lightheadedness. He notes a tiny bit of swelling in both legs but does not think it has changed much. No palpitations. He is currently taking glipizide  for diabetes and Xarelto , which he confirms he is still taking. He acknowledges that both Xarelto  and acalabrutinib  can contribute to bleeding.    HEMATOLOGICAL / ONCOLOGICAL HISTORY:     Hematology/Oncology History    No history exists.       OTHER PAST MEDICAL HISTORY:     Patient Active Problem List   Diagnosis    Chronic lymphocytic leukemia not having achieved remission    Chest pain    History of recurrent deep vein thrombosis (DVT)    Chronic anticoagulation    Subtherapeutic international normalized ratio (INR)    T2DM (type 2 diabetes mellitus)    HLD (hyperlipidemia)       ALLERGIES:     Patient has no known allergies.    MEDICATIONS:     Outpatient Encounter Medications as of 08/05/2023   Medication Sig Dispense Refill    acalabrutinib  (CALQUENCE ) 100 mg tablet Take 1  tablet (100 mg total) by mouth two (2) times a day. Swallow whole with water. Do not to chew, crush, dissolve, or cut tablets. 60 tablet 11    glipiZIDE  (GLUCOTROL  XL) 10 MG 24 hr tablet Take 1 tablet (10 mg total) by mouth two (2) times a day.      XARELTO  20 mg tablet       [DISCONTINUED] glipiZIDE  (GLUCOTROL  XL) 10 MG 24 hr tablet Take 1 tablet (10 mg total) by mouth.       No facility-administered encounter medications on file as of 08/05/2023.       SOCIAL HISTORY:      Social History     Socioeconomic History    Marital status: Married   Tobacco Use    Smoking status: Never    Smokeless tobacco: Never   Vaping Use    Vaping status: Never Used   Substance and Sexual Activity    Alcohol use: Not Currently     Alcohol/week: 26.0 standard drinks of alcohol     Types: 14 Cans of beer, 12 Shots of liquor per week     Comment: weekend consumption only     Drug use: Never     Social Drivers of Conservation officer, historic buildings Strain: Low Risk  (09/26/2019)    Overall Financial Resource Strain (CARDIA)     Difficulty of Paying Living Expenses: Not hard at all   Food Insecurity: No Food Insecurity (09/26/2019)    Hunger Vital Sign     Worried About Running Out of Food in the Last Year: Never true     Ran Out of Food in the Last Year: Never true   Transportation Needs: No Transportation Needs (09/26/2019)    PRAPARE - Therapist, art (Medical): No     Lack of Transportation (Non-Medical): No    [facility] from [facility]    Social Network        FAMILY HISTORY:     No blood disorders, sudden cardiac death or unexplained cardiac disease    Brother with colon cancer.    REVIEW OF SYSTEMS:   ROS completed and negative except as noted in the interval history.         VITAL SIGNS:     BSA: 2.24 meters squared  BP 89/61 Comment: asymptomatic - Pulse 74  - Temp 36.3 ??C (97.3 ??F) (Temporal)  - Resp 16  - Wt 95.8 kg (211 lb 1.6 oz)  - SpO2 97%  - BMI 27.10 kg/m??     PHYSICAL EXAM:     ECOG Performance Status: 0    GENERAL:  The patient appears well and in no obvious distress.  EYES: EOMI, no scleral icterus.   ZO:XWRUEAV rate and rhythm with no murmurs, rubs or gallops.  RESP:Clear to auscultation bilaterally.  WU:JWJXBJYNWGNF, nontender with normoactive bowel sounds. No palpable splenomegaly.  MSK: No edema. No palpable abnormalities.  LYMPH: No palpable cervical, supraclavicular, axillary, or inguinal lymphadenopathy.  DERM: lip/facial lesion as below.  NEURO: A&Ox4  PSYCH: Normal affect    LABS:     WBC   Date Value Ref Range Status   08/05/2023 17.4 (H) 3.6 - 11.2 10*9/L Final     Absolute Neutrophils   Date Value Ref Range Status   08/05/2023 3.4 1.8 - 7.8 10*9/L Final     Absolute Lymphocytes   Date Value Ref Range Status   08/05/2023 13.5 (H) 1.1 - 3.6 10*9/L Final  HGB   Date Value Ref Range Status   08/05/2023 14.9 12.9 - 16.5 g/dL Final     HCT   Date Value Ref Range Status 08/05/2023 45.0 39.0 - 48.0 % Final     MCV   Date Value Ref Range Status   08/05/2023 91.0 77.6 - 95.7 fL Final     Platelet   Date Value Ref Range Status   08/05/2023 64 (L) 150 - 450 10*9/L Final     Sodium   Date Value Ref Range Status   12/17/2022 143 135 - 145 mmol/L Final     Potassium   Date Value Ref Range Status   12/17/2022 4.7 3.4 - 4.8 mmol/L Final     Potassium, Bld   Date Value Ref Range Status   12/17/2022 4.3 3.4 - 4.6 mmol/L Final     Chloride   Date Value Ref Range Status   12/17/2022 109 (H) 98 - 107 mmol/L Final     CO2   Date Value Ref Range Status   12/17/2022 30.8 20.0 - 31.0 mmol/L Final     BUN   Date Value Ref Range Status   12/17/2022 17 9 - 23 mg/dL Final     Creatinine   Date Value Ref Range Status   12/17/2022 1.52 (H) 0.73 - 1.18 mg/dL Final     Glucose   Date Value Ref Range Status   12/17/2022 181 (H) 70 - 179 mg/dL Final     Calcium   Date Value Ref Range Status   12/17/2022 9.7 8.7 - 10.4 mg/dL Final     Magnesium   Date Value Ref Range Status   05/07/2021 1.7 1.6 - 2.6 mg/dL Final     Albumin   Date Value Ref Range Status   12/17/2022 4.1 3.4 - 5.0 g/dL Final     Total Protein   Date Value Ref Range Status   12/17/2022 6.7 5.7 - 8.2 g/dL Final     Total Bilirubin   Date Value Ref Range Status   12/17/2022 1.1 0.3 - 1.2 mg/dL Final     AST   Date Value Ref Range Status   12/17/2022 13 <=34 U/L Final     ALT   Date Value Ref Range Status   12/17/2022 <7 (L) 10 - 49 U/L Final     Alkaline Phosphatase   Date Value Ref Range Status   12/17/2022 72 46 - 116 U/L Final     Hep B S Ab   Date Value Ref Range Status   05/28/2021 Nonreactive Nonreactive, [place] Final     Hep B Core Total Ab   Date Value Ref Range Status   05/28/2021 Reactive (A) Nonreactive Final     Hepatitis C Ab   Date Value Ref Range Status   05/28/2021 Reactive (A) Nonreactive Final

## 2023-08-05 NOTE — Unmapped (Signed)
 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 He states he believe he will start to gain weight back now as he retired last week and is eating more frequently.

## 2023-08-06 DIAGNOSIS — C9110 Chronic lymphocytic leukemia of B-cell type not having achieved remission: Principal | ICD-10-CM

## 2023-08-11 NOTE — Unmapped (Signed)
 TC to patient to check on him and to find out if he was able to get a PCP appt soon regarding his elevated blood glucose in the office last week.     No answer x2, left VM.    Thanks    [employer]

## 2023-08-12 NOTE — Telephone Encounter (Signed)
 error

## 2023-08-18 NOTE — Unmapped (Signed)
 TC to patient to check on him. Per patient, he was able to see his PCP last week and his diabetes medication has been adjusted.    He states that he is aware that Anastacio Balm has been trying to reach him and sent a MyChart message regarding financial assistance for his acala. Patient states that he will get back to [place].    Thanks,    [employer]

## 2023-09-14 NOTE — Unmapped (Signed)
 Specialty Medication(s): Calquence    Mr.Ladnier has been dis-enrolled from the [employer] and [facility] specialty pharmacy services as a result of Pt has no active insurance.  MFA pending.    Additional information provided to the patient: NA    Wilbern Hancock, [facility]  [facility] Specialty Pharmacist

## 2023-12-09 ENCOUNTER — Ambulatory Visit: Admit: 2023-12-09 | Discharge: 2023-12-09 | Payer: PRIVATE HEALTH INSURANCE

## 2023-12-09 DIAGNOSIS — C9110 Chronic lymphocytic leukemia of B-cell type not having achieved remission: Principal | ICD-10-CM

## 2023-12-09 MED ORDER — ACALABRUTINIB MALEATE 100 MG TABLET
ORAL_TABLET | Freq: Two times a day (BID) | ORAL | 11 refills | 30.00000 days | Status: CP
Start: 2023-12-09 — End: ?
  Filled 2023-12-16: qty 60, 30d supply, fill #0

## 2023-12-14 NOTE — Unmapped (Signed)
 [facility] Specialty Medication Onboarding    Specialty Medication: CALQUENCE  100 mg tablet (acalabrutinib )  Prior Authorization: Not Required   Financial Assistance: No - copay  <$25  Final Copay/Day Supply: $12.15 / 30 days    Insurance Restrictions: Yes - max 1 month supply     Notes to Pharmacist: none  Credit Card on File: yes  Start Date on Rx:  11/2823    The triage team has completed the benefits investigation and has determined that the patient is able to fill this medication at [facility]. Please contact the patient to complete the onboarding or follow up with the prescribing physician as needed.

## 2023-12-15 NOTE — Unmapped (Signed)
 [facility]    Patient Onboarding/Medication Counseling    Mr.Scott Fox is a 68 y.o. male with Chronic lymphocytic leukemia who I am counseling today on initiation of therapy.  I am speaking to the patient.    Was a Nurse, learning disability used for this call? No    Verified patient's date of birth / HIPAA.    Specialty medication(s) to be sent: Hematology/Oncology: Calquence     Non-specialty medications/supplies to be sent: n/a    Medications not needed at this time: n/a     Calquence  (acalabrutinib )    The patient declined counseling on medication administration, missed dose instructions, goals of therapy, side effects and monitoring parameters, warnings and precautions, drug/food interactions, and storage, handling precautions, and disposal because they have taken the medication previously. The information in the declined sections below are for informational purposes only and was not discussed with patient.     Medication & Administration     Dosage: Take one 100 mg tablet by mouth every 12 hours    Administration:   Take with or without food, approximately 12 hours apart.   Swallow tablet whole with a full glass of water; do not open, break, or chew capsules.     Adherence/Missed dose instructions: Take a missed dose as soon as you think about it. If it has been more than 3 hours since the missed dose, skip the missed dose and go back to your normal time.  Do not take 2 doses at the same time or extra doses.    Goals of Therapy     To bring about and prolong progression free remission.  To improve the patient's quality of life.    Side Effects & Monitoring Parameters      Muscle pain, back pain, bone pain, joint pain, neck pain   Headache   Fatigue, loss of strength and energy   Common cold symptoms   Diarrhea   Nausea, vomiting   Abdominal pain   Constipation    The following side effects should be reported to the provider:  Signs of infection (fever >100.4, chills, mouth sores, sputum production)  Signs of bleeding (vomiting or coughing up blood, blood that looks like coffee grounds, blood in the urine or black, red tarry stools, bruising that gets bigger without reason, any persistent or severe bleeding)  Severe headache, dizziness or passing out  Severe chest pain, fast or abnormal heartbeat, shortness of breath  Weakness on 1 side of the body, trouble speaking or thinking, change in balance, drooping on one side of the face, or blurred eyesight.  Signs of anaphylaxis (wheezing, chest tightness, swelling of face, lips, tongue or throat)    Monitoring Parameters:  CBC (was monitored monthly in studies).   Evaluate pregnancy status prior to use in females of reproductive potential.   Atrial fibrillation and atrial flutter  Signs/symptoms of bleeding (in patients receiving antiplatelet or anticoagulant therapies), infection, and secondary malignancies  Adherence    Contraindications, Warnings, & Precautions     Bone marrow suppression: Grade 3 or 4 cytopenias including neutropenia, anemia, and thrombocytopenia have occurred in patients with hematologic malignancies treated with acalabrutinib  (as a single agent)  Cardiovascular adverse effects: Atrial fibrillation and atrial flutter (any grade) occurred in a small percentage of patients with hematologic malignancies treated with acalabrutinib  (as a single agent); grade 3 events were reported.  Hemorrhage: Serious hemorrhagic events (some fatal) have been reported in patients with hematologic malignancies who received acalabrutinib .   Infection: Serious  bacterial, viral, or fungal infections (including fatal events and opportunistic infections) have occurred in patients with hematologic malignancies treated with acalabrutinib  (as a single agent).  Secondary malignancies: Second primary malignancies, including non-skin carcinomas, have occurred in about one-tenth of patients with hematologic malignancies treated with acalabrutinib  (as a single agent); the most frequent second primary malignancy was skin cancer.     Drug/Food Interactions     Medication list reviewed in Epic. The patient was instructed to inform the care team before taking any new medications or supplements.   Rivaroxaban  / Acalabrutinib : Acalabrutinib  may increase anticoagulant effects. Educate patient to monitor for signs of bleeding. (Risk/benefit addressed in last MD progress note 12/09/2023)  Administration with a high-fat, high-calorie meal results in Cmax decreased by 73% and Tmax delayed 1 to 2 hours.  Avoid live vaccines    Storage, Handling Precautions, & Disposal     Store at room temperature in the original container (do not use a pillbox or store with other medications).   Caregivers helping administer medication should wear gloves and wash hands immediately after.    Keep the lid tightly closed. Keep out of the reach of children and pets.  Do not flush down a toilet or pour down a drain unless instructed to do so.  Check with your local police department or fire station about drug take-back programs in your area.       Current Medications (including OTC/herbals), Comorbidities and Allergies     Current Medications[1]    Allergies[2]    Problem List[3]    Reviewed and up to date in Epic.    Medication list has been reviewed and updated in Epic: Yes    Allergies have been reviewed and updated in Epic: Yes    Appropriateness of Therapy     Acute infections noted within Epic:  No active infections  Patient reported infection: None    Is the medication and dose appropriate based on diagnosis, medication list, comorbidities, allergies, medical history, patient???s ability to self-administer the medication, and therapeutic goals? Yes    Prescription has been clinically reviewed: Yes    Baseline Quality of Life Assessment      How many days over the past month did your condition  keep you from your normal activities? For example, brushing your teeth or getting up in the morning. 0    Patient-Reported Symptoms Tracker for Cancer Patients on Oral Chemotherapy     Oral chemotherapy medication name(s): Calquence   Dose and frequency: 100mg  twice daily  Oral Chemotherapy Start Date:    Baseline? No  Clinic(s) visited: Hematology  Were you able to reach the patient on a call today? Yes    Symptom Grouping Question Patient Response   Digestion and Eating Have you felt sick to your stomach? Denies    Had diarrhea? Denies    Constipated? Denies    Not wanting to eat? Denies    Comments      Sleep and Pain Felt very tired even after you rest? Denies    Pain due to cancer medication or cancer? Denies    Comments     Other Side Effects Numbness or tingling in hands and/or feet? Denies    Felt short of breath? Denies    Mouth or throat Sores? Denies    Rash? Denies    Palmar-plantar erythrodysesthesia syndrome?      Rash - acneiform?      Rash - maculo-papular?      How many days over the  past month did your cancer medication or cancer keep you from your normal activities?  Write in number of days, 0-30:  0    Other side effects or things you would like to discuss?      Comments?     Adherence  In the last 30 days, on how many days did you miss at least one dose of any of your [drug name]? Write in number of days, 0-30:  NA - resuming therapy    What reasons are you having trouble taking your medication [pharmacist: check all that apply]? Specify chemotherapy cycle:        No problems identified    Comments:        Comments       Optional Symptom Tracking  New start - hematology ([facility]    Comments:      Specialty Pharmacist Interventions:     Question Patient Response    What actions did you take in response to the patient's PRO answers?     Other (Specify):  reviewed med list and allergy list     Financial Information     Medication Assistance provided: None Required    Anticipated copay of $12.15 / 30 days reviewed with patient. Verified delivery address.    Delivery Information     Scheduled delivery date: 12/17/23    Expected start date: resuming therapy    Medication will be delivered via Next Day Courier to the prescription address in [facility].  This shipment will not require a signature.      Explained the services we provide at [facility] and that each month we would call to set up refills.  Stressed importance of returning phone calls so that we could ensure they receive their medications in time each month.  Informed patient that we should be setting up refills 7-10 days prior to when they will run out of medication.  A pharmacist will reach out to perform a clinical assessment periodically.  Informed patient that a welcome packet, containing information about our pharmacy and other support services, a Notice of Privacy Practices, and a drug information handout will be sent.      The patient or caregiver noted above participated in the development of this care plan and knows that they can request review of or adjustments to the care plan at any time.      Patient or caregiver verbalized understanding of the above information as well as how to contact the pharmacy at [phone] option 4 with any questions/concerns.  The pharmacy is open Monday through Friday 8:30am-4:30pm.  A pharmacist is available 24/7 via pager to answer any clinical questions they may have.    Patient Specific Needs     Does the patient have any physical, cognitive, or cultural barriers? No    Does the patient have adequate living arrangements? (i.e. the ability to store and take their medication appropriately) Yes    Did you identify any home environmental safety or security hazards? No    Patient prefers to have medications discussed with  Patient     Is the patient or caregiver able to read and understand education materials at a high school level or above? Yes    Patient's primary language is  English     Is the patient high risk? Yes, patient is taking oral chemotherapy. Appropriateness of therapy as been assessed    Does the patient have an additional or emergency  contact listed in their chart? Yes    SOCIAL DETERMINANTS OF HEALTH     At the [facility], we have learned that life circumstances - like trouble affording food, housing, utilities, or transportation can affect the health of many of our patients.   That is why we wanted to ask: are you currently experiencing any life circumstances that are negatively impacting your health and/or quality of life? Patient declined to answer    Social Drivers of Health     Food Insecurity: No Food Insecurity (09/26/2019)    Hunger Vital Sign     Worried About Running Out of Food in the Last Year: Never true     Ran Out of Food in the Last Year: Never true   Tobacco Use: Low Risk  (12/09/2023)    Patient History     Smoking Tobacco Use: Never     Smokeless Tobacco Use: Never     Passive Exposure: Not on file   Transportation Needs: No Transportation Needs (09/26/2019)    PRAPARE - Transportation     Lack of Transportation (Medical): No     Lack of Transportation (Non-Medical): No   Alcohol Use: Not on file   Housing: Not on file   Physical Activity: Not on file   Utilities: Not on file   Stress: Not on file   Interpersonal Safety: Not on file   Substance Use: Not on file (02/16/2023)   Intimate Partner Violence: Not on file   Social Connections: Not on file   Financial Resource Strain: Low Risk  (09/26/2019)    Overall Financial Resource Strain (CARDIA)     Difficulty of Paying Living Expenses: Not hard at all   Health Literacy: Not on file   Internet Connectivity: Not on file       Would you be willing to receive help with any of the needs that you have identified today? Not applicable       Aneita DELENA Merck, [facility]  [facility] Specialty Pharmacist         [1]   Current Outpatient Medications   Medication Sig Dispense Refill    acalabrutinib  (CALQUENCE ) 100 mg tablet Take 1 tablet (100 mg total) by mouth two (2) times a day. Swallow whole with water. Do not to chew, crush, dissolve, or cut tablets. 60 tablet 11    glipiZIDE  (GLUCOTROL  XL) 10 MG 24 hr tablet Take 1 tablet (10 mg total) by mouth two (2) times a day. 30 tablet 0    rivaroxaban  (XARELTO ) 10 mg tablet Take 1 tablet (10 mg total) by mouth daily. (Patient not taking: Reported on 12/15/2023) 90 tablet 2     No current facility-administered medications for this visit.   [2] No Known Allergies  [3]   Patient Active Problem List  Diagnosis    Chronic lymphocytic leukemia not having achieved remission    (CMS-HCC)    Chest pain    History of recurrent deep vein thrombosis (DVT)    Chronic anticoagulation    Subtherapeutic international normalized ratio (INR)    T2DM (type 2 diabetes mellitus)    (CMS-HCC)    HLD (hyperlipidemia)

## 2024-01-07 NOTE — Unmapped (Signed)
 Scott Fox. has been contacted in regards to their refill of Calquence . At this time, they have declined refill due to he has plenty on hand, unsure of quantity and he will call [facility] back on 01/14/24 for clinical assessment and scheduling delivery. Refill assessment call date has been updated per the patient's request.

## 2024-01-25 DIAGNOSIS — C9110 Chronic lymphocytic leukemia of B-cell type not having achieved remission: Principal | ICD-10-CM

## 2024-01-25 NOTE — Unmapped (Signed)
 OUTPATIENT SOCIAL WORKER [LCSW] OUTREACH ATTEMPT    Service: Adult Hematology Oncology      Patient Scott Fox) is a 68 y.o. male with Chronic Lymphocytic Leukemia (CLL).       Referral Source: Waddell Picker, CPP reached out directly to request LCSW support/assistance with insurance.      LCSW attempted to reach Scott Fox by phone on 01/25/2024. LCSW received no answer but left voice message with call-back number, requesting return call.     Bonni Neuser H. [facility]  Ambulatory Social Worker II  [phone]

## 2024-01-25 NOTE — Unmapped (Signed)
 Lex DELENA Irven Raddle. Is due for a refill of Calquence . At this time, they have declined refill due to insurance is terminated.  [facility] will route a referral to the MAPS team for MFA. The patient will be contacted once there is a resolution to the MAPS referral.    Alejo Beamer, PharmD  [facility]

## 2024-01-26 NOTE — Unmapped (Signed)
 OUTPATIENT SOCIAL WORKER [LCSW] OUTREACH ATTEMPT    Service: Adult Hematology Oncology      Patient Scott Fox) is a 68 y.o. male.       Referral Source: Waddell Picker, CPP reached out directly to request LCSW support/assistance with insurance questions.      LCSW attempted to reach Foreman by phone on 01/26/2024. LCSW received no answer but left voice message with call-back number, requesting return call.     Randen Kauth H. [facility]  Ambulatory Social Worker II  [phone]

## 2024-02-18 NOTE — Progress Notes (Signed)
 Specialty Medication(s): Calquence     Scott Fox has been dis-enrolled from the [facility] specialty pharmacy services as a result of enrollment in a manufacturer assistance program that sends medicine directly to the patient.    Additional information provided to the patient: Calquence  MFG assistance approved 02/15/24-04/12/25 at [facility] - Patient will need to follow up with manufacturer for refills/concerns at [phone].    Scott Fox, [facility]  [facility] Specialty Pharmacist
